# Patient Record
Sex: Female | Born: 1935 | Race: White | Hispanic: No | Smoking: Former smoker
Health system: Southern US, Community
[De-identification: ages and names within clinical notes are randomized; demographics above are authoritative.]

## PROBLEM LIST (undated history)

## (undated) DIAGNOSIS — E785 Hyperlipidemia, unspecified: Secondary | ICD-10-CM

## (undated) DIAGNOSIS — F419 Anxiety disorder, unspecified: Secondary | ICD-10-CM

## (undated) DIAGNOSIS — F32A Depression, unspecified: Secondary | ICD-10-CM

## (undated) DIAGNOSIS — I10 Essential (primary) hypertension: Secondary | ICD-10-CM

## (undated) DIAGNOSIS — R413 Other amnesia: Secondary | ICD-10-CM

---

## 2001-05-11 ENCOUNTER — Encounter: Payer: Self-pay | Admitting: Family Medicine

## 2001-05-11 ENCOUNTER — Ambulatory Visit (HOSPITAL_COMMUNITY): Admission: RE | Admit: 2001-05-11 | Discharge: 2001-05-11 | Payer: Self-pay | Admitting: Family Medicine

## 2001-05-14 ENCOUNTER — Encounter: Payer: Self-pay | Admitting: Family Medicine

## 2001-05-14 ENCOUNTER — Ambulatory Visit (HOSPITAL_COMMUNITY): Admission: RE | Admit: 2001-05-14 | Discharge: 2001-05-14 | Payer: Self-pay | Admitting: Family Medicine

## 2015-02-28 ENCOUNTER — Ambulatory Visit: Payer: Self-pay | Admitting: Nurse Practitioner

## 2015-02-28 ENCOUNTER — Telehealth: Payer: Self-pay | Admitting: Nurse Practitioner

## 2015-02-28 NOTE — Telephone Encounter (Signed)
Noted  

## 2015-02-28 NOTE — Telephone Encounter (Signed)
Pt was a no show

## 2015-11-07 DIAGNOSIS — I10 Essential (primary) hypertension: Secondary | ICD-10-CM | POA: Diagnosis not present

## 2015-11-07 DIAGNOSIS — Z6824 Body mass index (BMI) 24.0-24.9, adult: Secondary | ICD-10-CM | POA: Diagnosis not present

## 2015-11-08 DIAGNOSIS — F419 Anxiety disorder, unspecified: Secondary | ICD-10-CM | POA: Diagnosis not present

## 2015-11-08 DIAGNOSIS — M810 Age-related osteoporosis without current pathological fracture: Secondary | ICD-10-CM | POA: Diagnosis not present

## 2015-11-08 DIAGNOSIS — I10 Essential (primary) hypertension: Secondary | ICD-10-CM | POA: Diagnosis not present

## 2015-11-08 DIAGNOSIS — R412 Retrograde amnesia: Secondary | ICD-10-CM | POA: Diagnosis not present

## 2015-11-09 DIAGNOSIS — R412 Retrograde amnesia: Secondary | ICD-10-CM | POA: Diagnosis not present

## 2015-11-09 DIAGNOSIS — I10 Essential (primary) hypertension: Secondary | ICD-10-CM | POA: Diagnosis not present

## 2015-11-09 DIAGNOSIS — F419 Anxiety disorder, unspecified: Secondary | ICD-10-CM | POA: Diagnosis not present

## 2015-11-09 DIAGNOSIS — M810 Age-related osteoporosis without current pathological fracture: Secondary | ICD-10-CM | POA: Diagnosis not present

## 2015-11-10 DIAGNOSIS — I10 Essential (primary) hypertension: Secondary | ICD-10-CM | POA: Diagnosis not present

## 2015-11-10 DIAGNOSIS — M810 Age-related osteoporosis without current pathological fracture: Secondary | ICD-10-CM | POA: Diagnosis not present

## 2015-11-10 DIAGNOSIS — F419 Anxiety disorder, unspecified: Secondary | ICD-10-CM | POA: Diagnosis not present

## 2015-11-10 DIAGNOSIS — R412 Retrograde amnesia: Secondary | ICD-10-CM | POA: Diagnosis not present

## 2015-11-11 DIAGNOSIS — M810 Age-related osteoporosis without current pathological fracture: Secondary | ICD-10-CM | POA: Diagnosis not present

## 2015-11-11 DIAGNOSIS — I10 Essential (primary) hypertension: Secondary | ICD-10-CM | POA: Diagnosis not present

## 2015-11-11 DIAGNOSIS — R412 Retrograde amnesia: Secondary | ICD-10-CM | POA: Diagnosis not present

## 2015-11-11 DIAGNOSIS — F419 Anxiety disorder, unspecified: Secondary | ICD-10-CM | POA: Diagnosis not present

## 2015-11-12 DIAGNOSIS — M810 Age-related osteoporosis without current pathological fracture: Secondary | ICD-10-CM | POA: Diagnosis not present

## 2015-11-12 DIAGNOSIS — R412 Retrograde amnesia: Secondary | ICD-10-CM | POA: Diagnosis not present

## 2015-11-12 DIAGNOSIS — F419 Anxiety disorder, unspecified: Secondary | ICD-10-CM | POA: Diagnosis not present

## 2015-11-12 DIAGNOSIS — I10 Essential (primary) hypertension: Secondary | ICD-10-CM | POA: Diagnosis not present

## 2015-11-13 DIAGNOSIS — R412 Retrograde amnesia: Secondary | ICD-10-CM | POA: Diagnosis not present

## 2015-11-13 DIAGNOSIS — M810 Age-related osteoporosis without current pathological fracture: Secondary | ICD-10-CM | POA: Diagnosis not present

## 2015-11-13 DIAGNOSIS — I10 Essential (primary) hypertension: Secondary | ICD-10-CM | POA: Diagnosis not present

## 2015-11-13 DIAGNOSIS — F419 Anxiety disorder, unspecified: Secondary | ICD-10-CM | POA: Diagnosis not present

## 2015-11-14 DIAGNOSIS — F419 Anxiety disorder, unspecified: Secondary | ICD-10-CM | POA: Diagnosis not present

## 2015-11-14 DIAGNOSIS — R412 Retrograde amnesia: Secondary | ICD-10-CM | POA: Diagnosis not present

## 2015-11-14 DIAGNOSIS — M810 Age-related osteoporosis without current pathological fracture: Secondary | ICD-10-CM | POA: Diagnosis not present

## 2015-11-14 DIAGNOSIS — I10 Essential (primary) hypertension: Secondary | ICD-10-CM | POA: Diagnosis not present

## 2015-11-15 DIAGNOSIS — R412 Retrograde amnesia: Secondary | ICD-10-CM | POA: Diagnosis not present

## 2015-11-15 DIAGNOSIS — M810 Age-related osteoporosis without current pathological fracture: Secondary | ICD-10-CM | POA: Diagnosis not present

## 2015-11-15 DIAGNOSIS — I10 Essential (primary) hypertension: Secondary | ICD-10-CM | POA: Diagnosis not present

## 2015-11-15 DIAGNOSIS — F419 Anxiety disorder, unspecified: Secondary | ICD-10-CM | POA: Diagnosis not present

## 2015-11-16 DIAGNOSIS — M810 Age-related osteoporosis without current pathological fracture: Secondary | ICD-10-CM | POA: Diagnosis not present

## 2015-11-16 DIAGNOSIS — R412 Retrograde amnesia: Secondary | ICD-10-CM | POA: Diagnosis not present

## 2015-11-16 DIAGNOSIS — F419 Anxiety disorder, unspecified: Secondary | ICD-10-CM | POA: Diagnosis not present

## 2015-11-16 DIAGNOSIS — I10 Essential (primary) hypertension: Secondary | ICD-10-CM | POA: Diagnosis not present

## 2015-11-17 DIAGNOSIS — M810 Age-related osteoporosis without current pathological fracture: Secondary | ICD-10-CM | POA: Diagnosis not present

## 2015-11-17 DIAGNOSIS — I10 Essential (primary) hypertension: Secondary | ICD-10-CM | POA: Diagnosis not present

## 2015-11-17 DIAGNOSIS — R412 Retrograde amnesia: Secondary | ICD-10-CM | POA: Diagnosis not present

## 2015-11-17 DIAGNOSIS — F419 Anxiety disorder, unspecified: Secondary | ICD-10-CM | POA: Diagnosis not present

## 2015-11-18 DIAGNOSIS — R412 Retrograde amnesia: Secondary | ICD-10-CM | POA: Diagnosis not present

## 2015-11-18 DIAGNOSIS — I10 Essential (primary) hypertension: Secondary | ICD-10-CM | POA: Diagnosis not present

## 2015-11-18 DIAGNOSIS — F419 Anxiety disorder, unspecified: Secondary | ICD-10-CM | POA: Diagnosis not present

## 2015-11-18 DIAGNOSIS — M810 Age-related osteoporosis without current pathological fracture: Secondary | ICD-10-CM | POA: Diagnosis not present

## 2015-11-19 DIAGNOSIS — M810 Age-related osteoporosis without current pathological fracture: Secondary | ICD-10-CM | POA: Diagnosis not present

## 2015-11-19 DIAGNOSIS — F419 Anxiety disorder, unspecified: Secondary | ICD-10-CM | POA: Diagnosis not present

## 2015-11-19 DIAGNOSIS — I10 Essential (primary) hypertension: Secondary | ICD-10-CM | POA: Diagnosis not present

## 2015-11-19 DIAGNOSIS — R412 Retrograde amnesia: Secondary | ICD-10-CM | POA: Diagnosis not present

## 2015-11-20 DIAGNOSIS — R412 Retrograde amnesia: Secondary | ICD-10-CM | POA: Diagnosis not present

## 2015-11-20 DIAGNOSIS — I10 Essential (primary) hypertension: Secondary | ICD-10-CM | POA: Diagnosis not present

## 2015-11-20 DIAGNOSIS — M810 Age-related osteoporosis without current pathological fracture: Secondary | ICD-10-CM | POA: Diagnosis not present

## 2015-11-20 DIAGNOSIS — F419 Anxiety disorder, unspecified: Secondary | ICD-10-CM | POA: Diagnosis not present

## 2015-11-21 DIAGNOSIS — I10 Essential (primary) hypertension: Secondary | ICD-10-CM | POA: Diagnosis not present

## 2015-11-21 DIAGNOSIS — M810 Age-related osteoporosis without current pathological fracture: Secondary | ICD-10-CM | POA: Diagnosis not present

## 2015-11-21 DIAGNOSIS — R412 Retrograde amnesia: Secondary | ICD-10-CM | POA: Diagnosis not present

## 2015-11-21 DIAGNOSIS — F419 Anxiety disorder, unspecified: Secondary | ICD-10-CM | POA: Diagnosis not present

## 2015-11-22 DIAGNOSIS — M810 Age-related osteoporosis without current pathological fracture: Secondary | ICD-10-CM | POA: Diagnosis not present

## 2015-11-22 DIAGNOSIS — F419 Anxiety disorder, unspecified: Secondary | ICD-10-CM | POA: Diagnosis not present

## 2015-11-22 DIAGNOSIS — R412 Retrograde amnesia: Secondary | ICD-10-CM | POA: Diagnosis not present

## 2015-11-22 DIAGNOSIS — I10 Essential (primary) hypertension: Secondary | ICD-10-CM | POA: Diagnosis not present

## 2015-11-23 DIAGNOSIS — I10 Essential (primary) hypertension: Secondary | ICD-10-CM | POA: Diagnosis not present

## 2015-11-23 DIAGNOSIS — M810 Age-related osteoporosis without current pathological fracture: Secondary | ICD-10-CM | POA: Diagnosis not present

## 2015-11-23 DIAGNOSIS — R412 Retrograde amnesia: Secondary | ICD-10-CM | POA: Diagnosis not present

## 2015-11-23 DIAGNOSIS — F419 Anxiety disorder, unspecified: Secondary | ICD-10-CM | POA: Diagnosis not present

## 2015-11-24 DIAGNOSIS — M810 Age-related osteoporosis without current pathological fracture: Secondary | ICD-10-CM | POA: Diagnosis not present

## 2015-11-24 DIAGNOSIS — F419 Anxiety disorder, unspecified: Secondary | ICD-10-CM | POA: Diagnosis not present

## 2015-11-24 DIAGNOSIS — I10 Essential (primary) hypertension: Secondary | ICD-10-CM | POA: Diagnosis not present

## 2015-11-24 DIAGNOSIS — R412 Retrograde amnesia: Secondary | ICD-10-CM | POA: Diagnosis not present

## 2015-11-25 DIAGNOSIS — R412 Retrograde amnesia: Secondary | ICD-10-CM | POA: Diagnosis not present

## 2015-11-25 DIAGNOSIS — F419 Anxiety disorder, unspecified: Secondary | ICD-10-CM | POA: Diagnosis not present

## 2015-11-25 DIAGNOSIS — M810 Age-related osteoporosis without current pathological fracture: Secondary | ICD-10-CM | POA: Diagnosis not present

## 2015-11-25 DIAGNOSIS — I10 Essential (primary) hypertension: Secondary | ICD-10-CM | POA: Diagnosis not present

## 2015-11-26 DIAGNOSIS — R412 Retrograde amnesia: Secondary | ICD-10-CM | POA: Diagnosis not present

## 2015-11-26 DIAGNOSIS — F419 Anxiety disorder, unspecified: Secondary | ICD-10-CM | POA: Diagnosis not present

## 2015-11-26 DIAGNOSIS — M810 Age-related osteoporosis without current pathological fracture: Secondary | ICD-10-CM | POA: Diagnosis not present

## 2015-11-26 DIAGNOSIS — I10 Essential (primary) hypertension: Secondary | ICD-10-CM | POA: Diagnosis not present

## 2015-11-27 DIAGNOSIS — M810 Age-related osteoporosis without current pathological fracture: Secondary | ICD-10-CM | POA: Diagnosis not present

## 2015-11-27 DIAGNOSIS — R412 Retrograde amnesia: Secondary | ICD-10-CM | POA: Diagnosis not present

## 2015-11-27 DIAGNOSIS — F419 Anxiety disorder, unspecified: Secondary | ICD-10-CM | POA: Diagnosis not present

## 2015-11-27 DIAGNOSIS — I10 Essential (primary) hypertension: Secondary | ICD-10-CM | POA: Diagnosis not present

## 2015-11-28 DIAGNOSIS — M810 Age-related osteoporosis without current pathological fracture: Secondary | ICD-10-CM | POA: Diagnosis not present

## 2015-11-28 DIAGNOSIS — I10 Essential (primary) hypertension: Secondary | ICD-10-CM | POA: Diagnosis not present

## 2015-11-28 DIAGNOSIS — F419 Anxiety disorder, unspecified: Secondary | ICD-10-CM | POA: Diagnosis not present

## 2015-11-28 DIAGNOSIS — R412 Retrograde amnesia: Secondary | ICD-10-CM | POA: Diagnosis not present

## 2015-11-29 DIAGNOSIS — R412 Retrograde amnesia: Secondary | ICD-10-CM | POA: Diagnosis not present

## 2015-11-29 DIAGNOSIS — M810 Age-related osteoporosis without current pathological fracture: Secondary | ICD-10-CM | POA: Diagnosis not present

## 2015-11-29 DIAGNOSIS — F419 Anxiety disorder, unspecified: Secondary | ICD-10-CM | POA: Diagnosis not present

## 2015-11-29 DIAGNOSIS — I10 Essential (primary) hypertension: Secondary | ICD-10-CM | POA: Diagnosis not present

## 2015-11-30 DIAGNOSIS — F419 Anxiety disorder, unspecified: Secondary | ICD-10-CM | POA: Diagnosis not present

## 2015-11-30 DIAGNOSIS — I10 Essential (primary) hypertension: Secondary | ICD-10-CM | POA: Diagnosis not present

## 2015-11-30 DIAGNOSIS — R412 Retrograde amnesia: Secondary | ICD-10-CM | POA: Diagnosis not present

## 2015-11-30 DIAGNOSIS — M810 Age-related osteoporosis without current pathological fracture: Secondary | ICD-10-CM | POA: Diagnosis not present

## 2015-12-01 DIAGNOSIS — M810 Age-related osteoporosis without current pathological fracture: Secondary | ICD-10-CM | POA: Diagnosis not present

## 2015-12-01 DIAGNOSIS — I10 Essential (primary) hypertension: Secondary | ICD-10-CM | POA: Diagnosis not present

## 2015-12-01 DIAGNOSIS — F419 Anxiety disorder, unspecified: Secondary | ICD-10-CM | POA: Diagnosis not present

## 2015-12-01 DIAGNOSIS — R412 Retrograde amnesia: Secondary | ICD-10-CM | POA: Diagnosis not present

## 2015-12-02 DIAGNOSIS — M810 Age-related osteoporosis without current pathological fracture: Secondary | ICD-10-CM | POA: Diagnosis not present

## 2015-12-02 DIAGNOSIS — I10 Essential (primary) hypertension: Secondary | ICD-10-CM | POA: Diagnosis not present

## 2015-12-02 DIAGNOSIS — F419 Anxiety disorder, unspecified: Secondary | ICD-10-CM | POA: Diagnosis not present

## 2015-12-02 DIAGNOSIS — R412 Retrograde amnesia: Secondary | ICD-10-CM | POA: Diagnosis not present

## 2015-12-03 DIAGNOSIS — M810 Age-related osteoporosis without current pathological fracture: Secondary | ICD-10-CM | POA: Diagnosis not present

## 2015-12-03 DIAGNOSIS — F419 Anxiety disorder, unspecified: Secondary | ICD-10-CM | POA: Diagnosis not present

## 2015-12-03 DIAGNOSIS — I10 Essential (primary) hypertension: Secondary | ICD-10-CM | POA: Diagnosis not present

## 2015-12-03 DIAGNOSIS — R412 Retrograde amnesia: Secondary | ICD-10-CM | POA: Diagnosis not present

## 2015-12-04 DIAGNOSIS — R412 Retrograde amnesia: Secondary | ICD-10-CM | POA: Diagnosis not present

## 2015-12-04 DIAGNOSIS — I10 Essential (primary) hypertension: Secondary | ICD-10-CM | POA: Diagnosis not present

## 2015-12-04 DIAGNOSIS — F419 Anxiety disorder, unspecified: Secondary | ICD-10-CM | POA: Diagnosis not present

## 2015-12-04 DIAGNOSIS — M810 Age-related osteoporosis without current pathological fracture: Secondary | ICD-10-CM | POA: Diagnosis not present

## 2015-12-05 DIAGNOSIS — F419 Anxiety disorder, unspecified: Secondary | ICD-10-CM | POA: Diagnosis not present

## 2015-12-05 DIAGNOSIS — I10 Essential (primary) hypertension: Secondary | ICD-10-CM | POA: Diagnosis not present

## 2015-12-05 DIAGNOSIS — M810 Age-related osteoporosis without current pathological fracture: Secondary | ICD-10-CM | POA: Diagnosis not present

## 2015-12-05 DIAGNOSIS — R412 Retrograde amnesia: Secondary | ICD-10-CM | POA: Diagnosis not present

## 2015-12-06 DIAGNOSIS — F419 Anxiety disorder, unspecified: Secondary | ICD-10-CM | POA: Diagnosis not present

## 2015-12-06 DIAGNOSIS — I10 Essential (primary) hypertension: Secondary | ICD-10-CM | POA: Diagnosis not present

## 2015-12-06 DIAGNOSIS — R412 Retrograde amnesia: Secondary | ICD-10-CM | POA: Diagnosis not present

## 2015-12-06 DIAGNOSIS — M810 Age-related osteoporosis without current pathological fracture: Secondary | ICD-10-CM | POA: Diagnosis not present

## 2015-12-07 DIAGNOSIS — I10 Essential (primary) hypertension: Secondary | ICD-10-CM | POA: Diagnosis not present

## 2015-12-07 DIAGNOSIS — F419 Anxiety disorder, unspecified: Secondary | ICD-10-CM | POA: Diagnosis not present

## 2015-12-07 DIAGNOSIS — R412 Retrograde amnesia: Secondary | ICD-10-CM | POA: Diagnosis not present

## 2015-12-07 DIAGNOSIS — M810 Age-related osteoporosis without current pathological fracture: Secondary | ICD-10-CM | POA: Diagnosis not present

## 2015-12-08 DIAGNOSIS — R412 Retrograde amnesia: Secondary | ICD-10-CM | POA: Diagnosis not present

## 2015-12-08 DIAGNOSIS — I10 Essential (primary) hypertension: Secondary | ICD-10-CM | POA: Diagnosis not present

## 2015-12-08 DIAGNOSIS — M810 Age-related osteoporosis without current pathological fracture: Secondary | ICD-10-CM | POA: Diagnosis not present

## 2015-12-08 DIAGNOSIS — F419 Anxiety disorder, unspecified: Secondary | ICD-10-CM | POA: Diagnosis not present

## 2015-12-09 DIAGNOSIS — F419 Anxiety disorder, unspecified: Secondary | ICD-10-CM | POA: Diagnosis not present

## 2015-12-09 DIAGNOSIS — I10 Essential (primary) hypertension: Secondary | ICD-10-CM | POA: Diagnosis not present

## 2015-12-09 DIAGNOSIS — R412 Retrograde amnesia: Secondary | ICD-10-CM | POA: Diagnosis not present

## 2015-12-09 DIAGNOSIS — M810 Age-related osteoporosis without current pathological fracture: Secondary | ICD-10-CM | POA: Diagnosis not present

## 2015-12-10 DIAGNOSIS — F419 Anxiety disorder, unspecified: Secondary | ICD-10-CM | POA: Diagnosis not present

## 2015-12-10 DIAGNOSIS — R412 Retrograde amnesia: Secondary | ICD-10-CM | POA: Diagnosis not present

## 2015-12-10 DIAGNOSIS — M810 Age-related osteoporosis without current pathological fracture: Secondary | ICD-10-CM | POA: Diagnosis not present

## 2015-12-10 DIAGNOSIS — I10 Essential (primary) hypertension: Secondary | ICD-10-CM | POA: Diagnosis not present

## 2015-12-11 DIAGNOSIS — F419 Anxiety disorder, unspecified: Secondary | ICD-10-CM | POA: Diagnosis not present

## 2015-12-11 DIAGNOSIS — M810 Age-related osteoporosis without current pathological fracture: Secondary | ICD-10-CM | POA: Diagnosis not present

## 2015-12-11 DIAGNOSIS — R412 Retrograde amnesia: Secondary | ICD-10-CM | POA: Diagnosis not present

## 2015-12-11 DIAGNOSIS — I10 Essential (primary) hypertension: Secondary | ICD-10-CM | POA: Diagnosis not present

## 2015-12-12 DIAGNOSIS — I10 Essential (primary) hypertension: Secondary | ICD-10-CM | POA: Diagnosis not present

## 2015-12-12 DIAGNOSIS — R412 Retrograde amnesia: Secondary | ICD-10-CM | POA: Diagnosis not present

## 2015-12-12 DIAGNOSIS — M810 Age-related osteoporosis without current pathological fracture: Secondary | ICD-10-CM | POA: Diagnosis not present

## 2015-12-12 DIAGNOSIS — F419 Anxiety disorder, unspecified: Secondary | ICD-10-CM | POA: Diagnosis not present

## 2015-12-13 DIAGNOSIS — F419 Anxiety disorder, unspecified: Secondary | ICD-10-CM | POA: Diagnosis not present

## 2015-12-13 DIAGNOSIS — I10 Essential (primary) hypertension: Secondary | ICD-10-CM | POA: Diagnosis not present

## 2015-12-13 DIAGNOSIS — R412 Retrograde amnesia: Secondary | ICD-10-CM | POA: Diagnosis not present

## 2015-12-13 DIAGNOSIS — M810 Age-related osteoporosis without current pathological fracture: Secondary | ICD-10-CM | POA: Diagnosis not present

## 2015-12-14 DIAGNOSIS — F419 Anxiety disorder, unspecified: Secondary | ICD-10-CM | POA: Diagnosis not present

## 2015-12-14 DIAGNOSIS — R412 Retrograde amnesia: Secondary | ICD-10-CM | POA: Diagnosis not present

## 2015-12-14 DIAGNOSIS — M810 Age-related osteoporosis without current pathological fracture: Secondary | ICD-10-CM | POA: Diagnosis not present

## 2015-12-14 DIAGNOSIS — I10 Essential (primary) hypertension: Secondary | ICD-10-CM | POA: Diagnosis not present

## 2015-12-15 DIAGNOSIS — R412 Retrograde amnesia: Secondary | ICD-10-CM | POA: Diagnosis not present

## 2015-12-15 DIAGNOSIS — F419 Anxiety disorder, unspecified: Secondary | ICD-10-CM | POA: Diagnosis not present

## 2015-12-15 DIAGNOSIS — M810 Age-related osteoporosis without current pathological fracture: Secondary | ICD-10-CM | POA: Diagnosis not present

## 2015-12-15 DIAGNOSIS — I10 Essential (primary) hypertension: Secondary | ICD-10-CM | POA: Diagnosis not present

## 2015-12-16 DIAGNOSIS — F419 Anxiety disorder, unspecified: Secondary | ICD-10-CM | POA: Diagnosis not present

## 2015-12-16 DIAGNOSIS — M810 Age-related osteoporosis without current pathological fracture: Secondary | ICD-10-CM | POA: Diagnosis not present

## 2015-12-16 DIAGNOSIS — R412 Retrograde amnesia: Secondary | ICD-10-CM | POA: Diagnosis not present

## 2015-12-16 DIAGNOSIS — I10 Essential (primary) hypertension: Secondary | ICD-10-CM | POA: Diagnosis not present

## 2015-12-17 DIAGNOSIS — R412 Retrograde amnesia: Secondary | ICD-10-CM | POA: Diagnosis not present

## 2015-12-17 DIAGNOSIS — F419 Anxiety disorder, unspecified: Secondary | ICD-10-CM | POA: Diagnosis not present

## 2015-12-17 DIAGNOSIS — M810 Age-related osteoporosis without current pathological fracture: Secondary | ICD-10-CM | POA: Diagnosis not present

## 2015-12-17 DIAGNOSIS — I10 Essential (primary) hypertension: Secondary | ICD-10-CM | POA: Diagnosis not present

## 2015-12-18 DIAGNOSIS — R412 Retrograde amnesia: Secondary | ICD-10-CM | POA: Diagnosis not present

## 2015-12-18 DIAGNOSIS — I10 Essential (primary) hypertension: Secondary | ICD-10-CM | POA: Diagnosis not present

## 2015-12-18 DIAGNOSIS — F419 Anxiety disorder, unspecified: Secondary | ICD-10-CM | POA: Diagnosis not present

## 2015-12-18 DIAGNOSIS — M810 Age-related osteoporosis without current pathological fracture: Secondary | ICD-10-CM | POA: Diagnosis not present

## 2015-12-19 DIAGNOSIS — I10 Essential (primary) hypertension: Secondary | ICD-10-CM | POA: Diagnosis not present

## 2015-12-19 DIAGNOSIS — R412 Retrograde amnesia: Secondary | ICD-10-CM | POA: Diagnosis not present

## 2015-12-19 DIAGNOSIS — M810 Age-related osteoporosis without current pathological fracture: Secondary | ICD-10-CM | POA: Diagnosis not present

## 2015-12-19 DIAGNOSIS — F419 Anxiety disorder, unspecified: Secondary | ICD-10-CM | POA: Diagnosis not present

## 2015-12-20 DIAGNOSIS — M810 Age-related osteoporosis without current pathological fracture: Secondary | ICD-10-CM | POA: Diagnosis not present

## 2015-12-20 DIAGNOSIS — R412 Retrograde amnesia: Secondary | ICD-10-CM | POA: Diagnosis not present

## 2015-12-20 DIAGNOSIS — F419 Anxiety disorder, unspecified: Secondary | ICD-10-CM | POA: Diagnosis not present

## 2015-12-20 DIAGNOSIS — I10 Essential (primary) hypertension: Secondary | ICD-10-CM | POA: Diagnosis not present

## 2015-12-21 DIAGNOSIS — R412 Retrograde amnesia: Secondary | ICD-10-CM | POA: Diagnosis not present

## 2015-12-21 DIAGNOSIS — I10 Essential (primary) hypertension: Secondary | ICD-10-CM | POA: Diagnosis not present

## 2015-12-21 DIAGNOSIS — M810 Age-related osteoporosis without current pathological fracture: Secondary | ICD-10-CM | POA: Diagnosis not present

## 2015-12-21 DIAGNOSIS — F419 Anxiety disorder, unspecified: Secondary | ICD-10-CM | POA: Diagnosis not present

## 2015-12-22 DIAGNOSIS — M810 Age-related osteoporosis without current pathological fracture: Secondary | ICD-10-CM | POA: Diagnosis not present

## 2015-12-22 DIAGNOSIS — R412 Retrograde amnesia: Secondary | ICD-10-CM | POA: Diagnosis not present

## 2015-12-22 DIAGNOSIS — I10 Essential (primary) hypertension: Secondary | ICD-10-CM | POA: Diagnosis not present

## 2015-12-22 DIAGNOSIS — F419 Anxiety disorder, unspecified: Secondary | ICD-10-CM | POA: Diagnosis not present

## 2015-12-23 DIAGNOSIS — F419 Anxiety disorder, unspecified: Secondary | ICD-10-CM | POA: Diagnosis not present

## 2015-12-23 DIAGNOSIS — I10 Essential (primary) hypertension: Secondary | ICD-10-CM | POA: Diagnosis not present

## 2015-12-23 DIAGNOSIS — R412 Retrograde amnesia: Secondary | ICD-10-CM | POA: Diagnosis not present

## 2015-12-23 DIAGNOSIS — M810 Age-related osteoporosis without current pathological fracture: Secondary | ICD-10-CM | POA: Diagnosis not present

## 2015-12-24 DIAGNOSIS — M810 Age-related osteoporosis without current pathological fracture: Secondary | ICD-10-CM | POA: Diagnosis not present

## 2015-12-24 DIAGNOSIS — F419 Anxiety disorder, unspecified: Secondary | ICD-10-CM | POA: Diagnosis not present

## 2015-12-24 DIAGNOSIS — R412 Retrograde amnesia: Secondary | ICD-10-CM | POA: Diagnosis not present

## 2015-12-24 DIAGNOSIS — I10 Essential (primary) hypertension: Secondary | ICD-10-CM | POA: Diagnosis not present

## 2015-12-25 DIAGNOSIS — I10 Essential (primary) hypertension: Secondary | ICD-10-CM | POA: Diagnosis not present

## 2015-12-25 DIAGNOSIS — M810 Age-related osteoporosis without current pathological fracture: Secondary | ICD-10-CM | POA: Diagnosis not present

## 2015-12-25 DIAGNOSIS — R412 Retrograde amnesia: Secondary | ICD-10-CM | POA: Diagnosis not present

## 2015-12-25 DIAGNOSIS — F419 Anxiety disorder, unspecified: Secondary | ICD-10-CM | POA: Diagnosis not present

## 2015-12-26 DIAGNOSIS — F419 Anxiety disorder, unspecified: Secondary | ICD-10-CM | POA: Diagnosis not present

## 2015-12-26 DIAGNOSIS — M810 Age-related osteoporosis without current pathological fracture: Secondary | ICD-10-CM | POA: Diagnosis not present

## 2015-12-26 DIAGNOSIS — I10 Essential (primary) hypertension: Secondary | ICD-10-CM | POA: Diagnosis not present

## 2015-12-26 DIAGNOSIS — R412 Retrograde amnesia: Secondary | ICD-10-CM | POA: Diagnosis not present

## 2015-12-27 DIAGNOSIS — R412 Retrograde amnesia: Secondary | ICD-10-CM | POA: Diagnosis not present

## 2015-12-27 DIAGNOSIS — F419 Anxiety disorder, unspecified: Secondary | ICD-10-CM | POA: Diagnosis not present

## 2015-12-27 DIAGNOSIS — I10 Essential (primary) hypertension: Secondary | ICD-10-CM | POA: Diagnosis not present

## 2015-12-27 DIAGNOSIS — M810 Age-related osteoporosis without current pathological fracture: Secondary | ICD-10-CM | POA: Diagnosis not present

## 2015-12-28 DIAGNOSIS — R412 Retrograde amnesia: Secondary | ICD-10-CM | POA: Diagnosis not present

## 2015-12-28 DIAGNOSIS — I10 Essential (primary) hypertension: Secondary | ICD-10-CM | POA: Diagnosis not present

## 2015-12-28 DIAGNOSIS — F419 Anxiety disorder, unspecified: Secondary | ICD-10-CM | POA: Diagnosis not present

## 2015-12-28 DIAGNOSIS — M810 Age-related osteoporosis without current pathological fracture: Secondary | ICD-10-CM | POA: Diagnosis not present

## 2015-12-29 DIAGNOSIS — M810 Age-related osteoporosis without current pathological fracture: Secondary | ICD-10-CM | POA: Diagnosis not present

## 2015-12-29 DIAGNOSIS — R412 Retrograde amnesia: Secondary | ICD-10-CM | POA: Diagnosis not present

## 2015-12-29 DIAGNOSIS — I10 Essential (primary) hypertension: Secondary | ICD-10-CM | POA: Diagnosis not present

## 2015-12-29 DIAGNOSIS — F419 Anxiety disorder, unspecified: Secondary | ICD-10-CM | POA: Diagnosis not present

## 2015-12-30 DIAGNOSIS — F419 Anxiety disorder, unspecified: Secondary | ICD-10-CM | POA: Diagnosis not present

## 2015-12-30 DIAGNOSIS — R412 Retrograde amnesia: Secondary | ICD-10-CM | POA: Diagnosis not present

## 2015-12-30 DIAGNOSIS — I10 Essential (primary) hypertension: Secondary | ICD-10-CM | POA: Diagnosis not present

## 2015-12-30 DIAGNOSIS — M810 Age-related osteoporosis without current pathological fracture: Secondary | ICD-10-CM | POA: Diagnosis not present

## 2015-12-31 DIAGNOSIS — R412 Retrograde amnesia: Secondary | ICD-10-CM | POA: Diagnosis not present

## 2015-12-31 DIAGNOSIS — M810 Age-related osteoporosis without current pathological fracture: Secondary | ICD-10-CM | POA: Diagnosis not present

## 2015-12-31 DIAGNOSIS — I10 Essential (primary) hypertension: Secondary | ICD-10-CM | POA: Diagnosis not present

## 2015-12-31 DIAGNOSIS — F419 Anxiety disorder, unspecified: Secondary | ICD-10-CM | POA: Diagnosis not present

## 2016-01-01 DIAGNOSIS — I10 Essential (primary) hypertension: Secondary | ICD-10-CM | POA: Diagnosis not present

## 2016-01-01 DIAGNOSIS — F419 Anxiety disorder, unspecified: Secondary | ICD-10-CM | POA: Diagnosis not present

## 2016-01-01 DIAGNOSIS — R412 Retrograde amnesia: Secondary | ICD-10-CM | POA: Diagnosis not present

## 2016-01-01 DIAGNOSIS — M810 Age-related osteoporosis without current pathological fracture: Secondary | ICD-10-CM | POA: Diagnosis not present

## 2016-01-02 DIAGNOSIS — R412 Retrograde amnesia: Secondary | ICD-10-CM | POA: Diagnosis not present

## 2016-01-02 DIAGNOSIS — I10 Essential (primary) hypertension: Secondary | ICD-10-CM | POA: Diagnosis not present

## 2016-01-02 DIAGNOSIS — M810 Age-related osteoporosis without current pathological fracture: Secondary | ICD-10-CM | POA: Diagnosis not present

## 2016-01-02 DIAGNOSIS — F419 Anxiety disorder, unspecified: Secondary | ICD-10-CM | POA: Diagnosis not present

## 2016-01-03 DIAGNOSIS — I10 Essential (primary) hypertension: Secondary | ICD-10-CM | POA: Diagnosis not present

## 2016-01-03 DIAGNOSIS — R412 Retrograde amnesia: Secondary | ICD-10-CM | POA: Diagnosis not present

## 2016-01-03 DIAGNOSIS — F419 Anxiety disorder, unspecified: Secondary | ICD-10-CM | POA: Diagnosis not present

## 2016-01-03 DIAGNOSIS — M810 Age-related osteoporosis without current pathological fracture: Secondary | ICD-10-CM | POA: Diagnosis not present

## 2016-01-04 DIAGNOSIS — I10 Essential (primary) hypertension: Secondary | ICD-10-CM | POA: Diagnosis not present

## 2016-01-04 DIAGNOSIS — M810 Age-related osteoporosis without current pathological fracture: Secondary | ICD-10-CM | POA: Diagnosis not present

## 2016-01-04 DIAGNOSIS — F419 Anxiety disorder, unspecified: Secondary | ICD-10-CM | POA: Diagnosis not present

## 2016-01-04 DIAGNOSIS — R412 Retrograde amnesia: Secondary | ICD-10-CM | POA: Diagnosis not present

## 2016-01-05 DIAGNOSIS — F419 Anxiety disorder, unspecified: Secondary | ICD-10-CM | POA: Diagnosis not present

## 2016-01-05 DIAGNOSIS — I10 Essential (primary) hypertension: Secondary | ICD-10-CM | POA: Diagnosis not present

## 2016-01-05 DIAGNOSIS — R412 Retrograde amnesia: Secondary | ICD-10-CM | POA: Diagnosis not present

## 2016-01-05 DIAGNOSIS — M810 Age-related osteoporosis without current pathological fracture: Secondary | ICD-10-CM | POA: Diagnosis not present

## 2016-01-06 DIAGNOSIS — M810 Age-related osteoporosis without current pathological fracture: Secondary | ICD-10-CM | POA: Diagnosis not present

## 2016-01-06 DIAGNOSIS — F419 Anxiety disorder, unspecified: Secondary | ICD-10-CM | POA: Diagnosis not present

## 2016-01-06 DIAGNOSIS — R412 Retrograde amnesia: Secondary | ICD-10-CM | POA: Diagnosis not present

## 2016-01-06 DIAGNOSIS — I10 Essential (primary) hypertension: Secondary | ICD-10-CM | POA: Diagnosis not present

## 2016-01-07 DIAGNOSIS — F419 Anxiety disorder, unspecified: Secondary | ICD-10-CM | POA: Diagnosis not present

## 2016-01-07 DIAGNOSIS — I10 Essential (primary) hypertension: Secondary | ICD-10-CM | POA: Diagnosis not present

## 2016-01-07 DIAGNOSIS — R412 Retrograde amnesia: Secondary | ICD-10-CM | POA: Diagnosis not present

## 2016-01-07 DIAGNOSIS — M810 Age-related osteoporosis without current pathological fracture: Secondary | ICD-10-CM | POA: Diagnosis not present

## 2016-01-08 DIAGNOSIS — I10 Essential (primary) hypertension: Secondary | ICD-10-CM | POA: Diagnosis not present

## 2016-01-08 DIAGNOSIS — F419 Anxiety disorder, unspecified: Secondary | ICD-10-CM | POA: Diagnosis not present

## 2016-01-08 DIAGNOSIS — M810 Age-related osteoporosis without current pathological fracture: Secondary | ICD-10-CM | POA: Diagnosis not present

## 2016-01-08 DIAGNOSIS — R412 Retrograde amnesia: Secondary | ICD-10-CM | POA: Diagnosis not present

## 2016-01-09 DIAGNOSIS — I10 Essential (primary) hypertension: Secondary | ICD-10-CM | POA: Diagnosis not present

## 2016-01-09 DIAGNOSIS — F419 Anxiety disorder, unspecified: Secondary | ICD-10-CM | POA: Diagnosis not present

## 2016-01-09 DIAGNOSIS — M810 Age-related osteoporosis without current pathological fracture: Secondary | ICD-10-CM | POA: Diagnosis not present

## 2016-01-09 DIAGNOSIS — R412 Retrograde amnesia: Secondary | ICD-10-CM | POA: Diagnosis not present

## 2016-01-10 DIAGNOSIS — M810 Age-related osteoporosis without current pathological fracture: Secondary | ICD-10-CM | POA: Diagnosis not present

## 2016-01-10 DIAGNOSIS — I10 Essential (primary) hypertension: Secondary | ICD-10-CM | POA: Diagnosis not present

## 2016-01-10 DIAGNOSIS — R412 Retrograde amnesia: Secondary | ICD-10-CM | POA: Diagnosis not present

## 2016-01-10 DIAGNOSIS — F419 Anxiety disorder, unspecified: Secondary | ICD-10-CM | POA: Diagnosis not present

## 2016-01-11 DIAGNOSIS — R412 Retrograde amnesia: Secondary | ICD-10-CM | POA: Diagnosis not present

## 2016-01-11 DIAGNOSIS — M810 Age-related osteoporosis without current pathological fracture: Secondary | ICD-10-CM | POA: Diagnosis not present

## 2016-01-11 DIAGNOSIS — I10 Essential (primary) hypertension: Secondary | ICD-10-CM | POA: Diagnosis not present

## 2016-01-11 DIAGNOSIS — F419 Anxiety disorder, unspecified: Secondary | ICD-10-CM | POA: Diagnosis not present

## 2016-01-12 DIAGNOSIS — F419 Anxiety disorder, unspecified: Secondary | ICD-10-CM | POA: Diagnosis not present

## 2016-01-12 DIAGNOSIS — I10 Essential (primary) hypertension: Secondary | ICD-10-CM | POA: Diagnosis not present

## 2016-01-12 DIAGNOSIS — R412 Retrograde amnesia: Secondary | ICD-10-CM | POA: Diagnosis not present

## 2016-01-12 DIAGNOSIS — M810 Age-related osteoporosis without current pathological fracture: Secondary | ICD-10-CM | POA: Diagnosis not present

## 2016-01-13 DIAGNOSIS — F419 Anxiety disorder, unspecified: Secondary | ICD-10-CM | POA: Diagnosis not present

## 2016-01-13 DIAGNOSIS — R412 Retrograde amnesia: Secondary | ICD-10-CM | POA: Diagnosis not present

## 2016-01-13 DIAGNOSIS — I10 Essential (primary) hypertension: Secondary | ICD-10-CM | POA: Diagnosis not present

## 2016-01-13 DIAGNOSIS — M810 Age-related osteoporosis without current pathological fracture: Secondary | ICD-10-CM | POA: Diagnosis not present

## 2016-01-14 DIAGNOSIS — F419 Anxiety disorder, unspecified: Secondary | ICD-10-CM | POA: Diagnosis not present

## 2016-01-14 DIAGNOSIS — I10 Essential (primary) hypertension: Secondary | ICD-10-CM | POA: Diagnosis not present

## 2016-01-14 DIAGNOSIS — R412 Retrograde amnesia: Secondary | ICD-10-CM | POA: Diagnosis not present

## 2016-01-14 DIAGNOSIS — M810 Age-related osteoporosis without current pathological fracture: Secondary | ICD-10-CM | POA: Diagnosis not present

## 2016-01-15 DIAGNOSIS — M810 Age-related osteoporosis without current pathological fracture: Secondary | ICD-10-CM | POA: Diagnosis not present

## 2016-01-15 DIAGNOSIS — F419 Anxiety disorder, unspecified: Secondary | ICD-10-CM | POA: Diagnosis not present

## 2016-01-15 DIAGNOSIS — R412 Retrograde amnesia: Secondary | ICD-10-CM | POA: Diagnosis not present

## 2016-01-15 DIAGNOSIS — I10 Essential (primary) hypertension: Secondary | ICD-10-CM | POA: Diagnosis not present

## 2016-01-16 DIAGNOSIS — M810 Age-related osteoporosis without current pathological fracture: Secondary | ICD-10-CM | POA: Diagnosis not present

## 2016-01-16 DIAGNOSIS — I10 Essential (primary) hypertension: Secondary | ICD-10-CM | POA: Diagnosis not present

## 2016-01-16 DIAGNOSIS — F419 Anxiety disorder, unspecified: Secondary | ICD-10-CM | POA: Diagnosis not present

## 2016-01-16 DIAGNOSIS — R412 Retrograde amnesia: Secondary | ICD-10-CM | POA: Diagnosis not present

## 2016-01-17 DIAGNOSIS — F419 Anxiety disorder, unspecified: Secondary | ICD-10-CM | POA: Diagnosis not present

## 2016-01-17 DIAGNOSIS — R412 Retrograde amnesia: Secondary | ICD-10-CM | POA: Diagnosis not present

## 2016-01-17 DIAGNOSIS — M810 Age-related osteoporosis without current pathological fracture: Secondary | ICD-10-CM | POA: Diagnosis not present

## 2016-01-17 DIAGNOSIS — I10 Essential (primary) hypertension: Secondary | ICD-10-CM | POA: Diagnosis not present

## 2016-01-18 DIAGNOSIS — I10 Essential (primary) hypertension: Secondary | ICD-10-CM | POA: Diagnosis not present

## 2016-01-18 DIAGNOSIS — M810 Age-related osteoporosis without current pathological fracture: Secondary | ICD-10-CM | POA: Diagnosis not present

## 2016-01-18 DIAGNOSIS — R412 Retrograde amnesia: Secondary | ICD-10-CM | POA: Diagnosis not present

## 2016-01-18 DIAGNOSIS — F419 Anxiety disorder, unspecified: Secondary | ICD-10-CM | POA: Diagnosis not present

## 2016-01-19 DIAGNOSIS — M810 Age-related osteoporosis without current pathological fracture: Secondary | ICD-10-CM | POA: Diagnosis not present

## 2016-01-19 DIAGNOSIS — F419 Anxiety disorder, unspecified: Secondary | ICD-10-CM | POA: Diagnosis not present

## 2016-01-19 DIAGNOSIS — I10 Essential (primary) hypertension: Secondary | ICD-10-CM | POA: Diagnosis not present

## 2016-01-19 DIAGNOSIS — R412 Retrograde amnesia: Secondary | ICD-10-CM | POA: Diagnosis not present

## 2016-01-20 DIAGNOSIS — M810 Age-related osteoporosis without current pathological fracture: Secondary | ICD-10-CM | POA: Diagnosis not present

## 2016-01-20 DIAGNOSIS — R412 Retrograde amnesia: Secondary | ICD-10-CM | POA: Diagnosis not present

## 2016-01-20 DIAGNOSIS — F419 Anxiety disorder, unspecified: Secondary | ICD-10-CM | POA: Diagnosis not present

## 2016-01-20 DIAGNOSIS — I10 Essential (primary) hypertension: Secondary | ICD-10-CM | POA: Diagnosis not present

## 2016-01-21 DIAGNOSIS — F419 Anxiety disorder, unspecified: Secondary | ICD-10-CM | POA: Diagnosis not present

## 2016-01-21 DIAGNOSIS — R412 Retrograde amnesia: Secondary | ICD-10-CM | POA: Diagnosis not present

## 2016-01-21 DIAGNOSIS — I10 Essential (primary) hypertension: Secondary | ICD-10-CM | POA: Diagnosis not present

## 2016-01-21 DIAGNOSIS — M810 Age-related osteoporosis without current pathological fracture: Secondary | ICD-10-CM | POA: Diagnosis not present

## 2016-01-22 DIAGNOSIS — M810 Age-related osteoporosis without current pathological fracture: Secondary | ICD-10-CM | POA: Diagnosis not present

## 2016-01-22 DIAGNOSIS — F419 Anxiety disorder, unspecified: Secondary | ICD-10-CM | POA: Diagnosis not present

## 2016-01-22 DIAGNOSIS — R412 Retrograde amnesia: Secondary | ICD-10-CM | POA: Diagnosis not present

## 2016-01-22 DIAGNOSIS — I10 Essential (primary) hypertension: Secondary | ICD-10-CM | POA: Diagnosis not present

## 2016-01-23 DIAGNOSIS — F419 Anxiety disorder, unspecified: Secondary | ICD-10-CM | POA: Diagnosis not present

## 2016-01-23 DIAGNOSIS — I10 Essential (primary) hypertension: Secondary | ICD-10-CM | POA: Diagnosis not present

## 2016-01-23 DIAGNOSIS — M810 Age-related osteoporosis without current pathological fracture: Secondary | ICD-10-CM | POA: Diagnosis not present

## 2016-01-23 DIAGNOSIS — R412 Retrograde amnesia: Secondary | ICD-10-CM | POA: Diagnosis not present

## 2016-01-24 DIAGNOSIS — F419 Anxiety disorder, unspecified: Secondary | ICD-10-CM | POA: Diagnosis not present

## 2016-01-24 DIAGNOSIS — R412 Retrograde amnesia: Secondary | ICD-10-CM | POA: Diagnosis not present

## 2016-01-24 DIAGNOSIS — I10 Essential (primary) hypertension: Secondary | ICD-10-CM | POA: Diagnosis not present

## 2016-01-24 DIAGNOSIS — M810 Age-related osteoporosis without current pathological fracture: Secondary | ICD-10-CM | POA: Diagnosis not present

## 2016-01-25 DIAGNOSIS — R412 Retrograde amnesia: Secondary | ICD-10-CM | POA: Diagnosis not present

## 2016-01-25 DIAGNOSIS — I10 Essential (primary) hypertension: Secondary | ICD-10-CM | POA: Diagnosis not present

## 2016-01-25 DIAGNOSIS — M810 Age-related osteoporosis without current pathological fracture: Secondary | ICD-10-CM | POA: Diagnosis not present

## 2016-01-25 DIAGNOSIS — F419 Anxiety disorder, unspecified: Secondary | ICD-10-CM | POA: Diagnosis not present

## 2016-01-26 DIAGNOSIS — I10 Essential (primary) hypertension: Secondary | ICD-10-CM | POA: Diagnosis not present

## 2016-01-26 DIAGNOSIS — R412 Retrograde amnesia: Secondary | ICD-10-CM | POA: Diagnosis not present

## 2016-01-26 DIAGNOSIS — F419 Anxiety disorder, unspecified: Secondary | ICD-10-CM | POA: Diagnosis not present

## 2016-01-26 DIAGNOSIS — M810 Age-related osteoporosis without current pathological fracture: Secondary | ICD-10-CM | POA: Diagnosis not present

## 2016-01-27 DIAGNOSIS — I10 Essential (primary) hypertension: Secondary | ICD-10-CM | POA: Diagnosis not present

## 2016-01-27 DIAGNOSIS — M810 Age-related osteoporosis without current pathological fracture: Secondary | ICD-10-CM | POA: Diagnosis not present

## 2016-01-27 DIAGNOSIS — R412 Retrograde amnesia: Secondary | ICD-10-CM | POA: Diagnosis not present

## 2016-01-27 DIAGNOSIS — F419 Anxiety disorder, unspecified: Secondary | ICD-10-CM | POA: Diagnosis not present

## 2016-01-28 DIAGNOSIS — F419 Anxiety disorder, unspecified: Secondary | ICD-10-CM | POA: Diagnosis not present

## 2016-01-28 DIAGNOSIS — M810 Age-related osteoporosis without current pathological fracture: Secondary | ICD-10-CM | POA: Diagnosis not present

## 2016-01-28 DIAGNOSIS — I10 Essential (primary) hypertension: Secondary | ICD-10-CM | POA: Diagnosis not present

## 2016-01-28 DIAGNOSIS — R412 Retrograde amnesia: Secondary | ICD-10-CM | POA: Diagnosis not present

## 2016-01-29 DIAGNOSIS — F419 Anxiety disorder, unspecified: Secondary | ICD-10-CM | POA: Diagnosis not present

## 2016-01-29 DIAGNOSIS — R412 Retrograde amnesia: Secondary | ICD-10-CM | POA: Diagnosis not present

## 2016-01-29 DIAGNOSIS — I10 Essential (primary) hypertension: Secondary | ICD-10-CM | POA: Diagnosis not present

## 2016-01-29 DIAGNOSIS — M810 Age-related osteoporosis without current pathological fracture: Secondary | ICD-10-CM | POA: Diagnosis not present

## 2016-01-30 DIAGNOSIS — I10 Essential (primary) hypertension: Secondary | ICD-10-CM | POA: Diagnosis not present

## 2016-01-30 DIAGNOSIS — R412 Retrograde amnesia: Secondary | ICD-10-CM | POA: Diagnosis not present

## 2016-01-30 DIAGNOSIS — F419 Anxiety disorder, unspecified: Secondary | ICD-10-CM | POA: Diagnosis not present

## 2016-01-30 DIAGNOSIS — M810 Age-related osteoporosis without current pathological fracture: Secondary | ICD-10-CM | POA: Diagnosis not present

## 2016-01-31 DIAGNOSIS — F419 Anxiety disorder, unspecified: Secondary | ICD-10-CM | POA: Diagnosis not present

## 2016-01-31 DIAGNOSIS — R412 Retrograde amnesia: Secondary | ICD-10-CM | POA: Diagnosis not present

## 2016-01-31 DIAGNOSIS — I10 Essential (primary) hypertension: Secondary | ICD-10-CM | POA: Diagnosis not present

## 2016-01-31 DIAGNOSIS — M810 Age-related osteoporosis without current pathological fracture: Secondary | ICD-10-CM | POA: Diagnosis not present

## 2016-02-01 DIAGNOSIS — R412 Retrograde amnesia: Secondary | ICD-10-CM | POA: Diagnosis not present

## 2016-02-01 DIAGNOSIS — F419 Anxiety disorder, unspecified: Secondary | ICD-10-CM | POA: Diagnosis not present

## 2016-02-01 DIAGNOSIS — M810 Age-related osteoporosis without current pathological fracture: Secondary | ICD-10-CM | POA: Diagnosis not present

## 2016-02-01 DIAGNOSIS — I10 Essential (primary) hypertension: Secondary | ICD-10-CM | POA: Diagnosis not present

## 2016-02-02 DIAGNOSIS — F419 Anxiety disorder, unspecified: Secondary | ICD-10-CM | POA: Diagnosis not present

## 2016-02-02 DIAGNOSIS — I10 Essential (primary) hypertension: Secondary | ICD-10-CM | POA: Diagnosis not present

## 2016-02-02 DIAGNOSIS — M810 Age-related osteoporosis without current pathological fracture: Secondary | ICD-10-CM | POA: Diagnosis not present

## 2016-02-02 DIAGNOSIS — R412 Retrograde amnesia: Secondary | ICD-10-CM | POA: Diagnosis not present

## 2016-02-03 DIAGNOSIS — R412 Retrograde amnesia: Secondary | ICD-10-CM | POA: Diagnosis not present

## 2016-02-03 DIAGNOSIS — F419 Anxiety disorder, unspecified: Secondary | ICD-10-CM | POA: Diagnosis not present

## 2016-02-03 DIAGNOSIS — I10 Essential (primary) hypertension: Secondary | ICD-10-CM | POA: Diagnosis not present

## 2016-02-03 DIAGNOSIS — M810 Age-related osteoporosis without current pathological fracture: Secondary | ICD-10-CM | POA: Diagnosis not present

## 2016-02-04 DIAGNOSIS — F419 Anxiety disorder, unspecified: Secondary | ICD-10-CM | POA: Diagnosis not present

## 2016-02-04 DIAGNOSIS — R412 Retrograde amnesia: Secondary | ICD-10-CM | POA: Diagnosis not present

## 2016-02-04 DIAGNOSIS — M810 Age-related osteoporosis without current pathological fracture: Secondary | ICD-10-CM | POA: Diagnosis not present

## 2016-02-04 DIAGNOSIS — I10 Essential (primary) hypertension: Secondary | ICD-10-CM | POA: Diagnosis not present

## 2016-02-05 DIAGNOSIS — I10 Essential (primary) hypertension: Secondary | ICD-10-CM | POA: Diagnosis not present

## 2016-02-05 DIAGNOSIS — M810 Age-related osteoporosis without current pathological fracture: Secondary | ICD-10-CM | POA: Diagnosis not present

## 2016-02-05 DIAGNOSIS — F419 Anxiety disorder, unspecified: Secondary | ICD-10-CM | POA: Diagnosis not present

## 2016-02-05 DIAGNOSIS — R412 Retrograde amnesia: Secondary | ICD-10-CM | POA: Diagnosis not present

## 2016-02-06 DIAGNOSIS — F419 Anxiety disorder, unspecified: Secondary | ICD-10-CM | POA: Diagnosis not present

## 2016-02-06 DIAGNOSIS — R412 Retrograde amnesia: Secondary | ICD-10-CM | POA: Diagnosis not present

## 2016-02-06 DIAGNOSIS — M810 Age-related osteoporosis without current pathological fracture: Secondary | ICD-10-CM | POA: Diagnosis not present

## 2016-02-06 DIAGNOSIS — I10 Essential (primary) hypertension: Secondary | ICD-10-CM | POA: Diagnosis not present

## 2016-02-07 DIAGNOSIS — R412 Retrograde amnesia: Secondary | ICD-10-CM | POA: Diagnosis not present

## 2016-02-07 DIAGNOSIS — I10 Essential (primary) hypertension: Secondary | ICD-10-CM | POA: Diagnosis not present

## 2016-02-07 DIAGNOSIS — M810 Age-related osteoporosis without current pathological fracture: Secondary | ICD-10-CM | POA: Diagnosis not present

## 2016-02-07 DIAGNOSIS — F419 Anxiety disorder, unspecified: Secondary | ICD-10-CM | POA: Diagnosis not present

## 2016-02-08 DIAGNOSIS — I10 Essential (primary) hypertension: Secondary | ICD-10-CM | POA: Diagnosis not present

## 2016-02-08 DIAGNOSIS — R412 Retrograde amnesia: Secondary | ICD-10-CM | POA: Diagnosis not present

## 2016-02-08 DIAGNOSIS — F419 Anxiety disorder, unspecified: Secondary | ICD-10-CM | POA: Diagnosis not present

## 2016-02-08 DIAGNOSIS — M810 Age-related osteoporosis without current pathological fracture: Secondary | ICD-10-CM | POA: Diagnosis not present

## 2016-02-09 DIAGNOSIS — M810 Age-related osteoporosis without current pathological fracture: Secondary | ICD-10-CM | POA: Diagnosis not present

## 2016-02-09 DIAGNOSIS — F419 Anxiety disorder, unspecified: Secondary | ICD-10-CM | POA: Diagnosis not present

## 2016-02-09 DIAGNOSIS — I10 Essential (primary) hypertension: Secondary | ICD-10-CM | POA: Diagnosis not present

## 2016-02-09 DIAGNOSIS — R412 Retrograde amnesia: Secondary | ICD-10-CM | POA: Diagnosis not present

## 2016-02-10 DIAGNOSIS — I10 Essential (primary) hypertension: Secondary | ICD-10-CM | POA: Diagnosis not present

## 2016-02-10 DIAGNOSIS — F419 Anxiety disorder, unspecified: Secondary | ICD-10-CM | POA: Diagnosis not present

## 2016-02-10 DIAGNOSIS — R412 Retrograde amnesia: Secondary | ICD-10-CM | POA: Diagnosis not present

## 2016-02-10 DIAGNOSIS — M810 Age-related osteoporosis without current pathological fracture: Secondary | ICD-10-CM | POA: Diagnosis not present

## 2016-02-11 DIAGNOSIS — I10 Essential (primary) hypertension: Secondary | ICD-10-CM | POA: Diagnosis not present

## 2016-02-11 DIAGNOSIS — M810 Age-related osteoporosis without current pathological fracture: Secondary | ICD-10-CM | POA: Diagnosis not present

## 2016-02-11 DIAGNOSIS — R412 Retrograde amnesia: Secondary | ICD-10-CM | POA: Diagnosis not present

## 2016-02-11 DIAGNOSIS — F419 Anxiety disorder, unspecified: Secondary | ICD-10-CM | POA: Diagnosis not present

## 2016-02-12 DIAGNOSIS — R412 Retrograde amnesia: Secondary | ICD-10-CM | POA: Diagnosis not present

## 2016-02-12 DIAGNOSIS — I10 Essential (primary) hypertension: Secondary | ICD-10-CM | POA: Diagnosis not present

## 2016-02-12 DIAGNOSIS — F419 Anxiety disorder, unspecified: Secondary | ICD-10-CM | POA: Diagnosis not present

## 2016-02-12 DIAGNOSIS — M810 Age-related osteoporosis without current pathological fracture: Secondary | ICD-10-CM | POA: Diagnosis not present

## 2016-02-13 DIAGNOSIS — M810 Age-related osteoporosis without current pathological fracture: Secondary | ICD-10-CM | POA: Diagnosis not present

## 2016-02-13 DIAGNOSIS — R412 Retrograde amnesia: Secondary | ICD-10-CM | POA: Diagnosis not present

## 2016-02-13 DIAGNOSIS — I10 Essential (primary) hypertension: Secondary | ICD-10-CM | POA: Diagnosis not present

## 2016-02-13 DIAGNOSIS — F419 Anxiety disorder, unspecified: Secondary | ICD-10-CM | POA: Diagnosis not present

## 2016-02-14 DIAGNOSIS — M810 Age-related osteoporosis without current pathological fracture: Secondary | ICD-10-CM | POA: Diagnosis not present

## 2016-02-14 DIAGNOSIS — R412 Retrograde amnesia: Secondary | ICD-10-CM | POA: Diagnosis not present

## 2016-02-14 DIAGNOSIS — I10 Essential (primary) hypertension: Secondary | ICD-10-CM | POA: Diagnosis not present

## 2016-02-14 DIAGNOSIS — F419 Anxiety disorder, unspecified: Secondary | ICD-10-CM | POA: Diagnosis not present

## 2016-02-15 DIAGNOSIS — M810 Age-related osteoporosis without current pathological fracture: Secondary | ICD-10-CM | POA: Diagnosis not present

## 2016-02-15 DIAGNOSIS — I10 Essential (primary) hypertension: Secondary | ICD-10-CM | POA: Diagnosis not present

## 2016-02-15 DIAGNOSIS — R412 Retrograde amnesia: Secondary | ICD-10-CM | POA: Diagnosis not present

## 2016-02-15 DIAGNOSIS — F419 Anxiety disorder, unspecified: Secondary | ICD-10-CM | POA: Diagnosis not present

## 2016-02-16 DIAGNOSIS — Z6824 Body mass index (BMI) 24.0-24.9, adult: Secondary | ICD-10-CM | POA: Diagnosis not present

## 2016-02-16 DIAGNOSIS — I10 Essential (primary) hypertension: Secondary | ICD-10-CM | POA: Diagnosis not present

## 2016-02-16 DIAGNOSIS — R412 Retrograde amnesia: Secondary | ICD-10-CM | POA: Diagnosis not present

## 2016-02-16 DIAGNOSIS — F419 Anxiety disorder, unspecified: Secondary | ICD-10-CM | POA: Diagnosis not present

## 2016-02-16 DIAGNOSIS — M810 Age-related osteoporosis without current pathological fracture: Secondary | ICD-10-CM | POA: Diagnosis not present

## 2016-02-17 DIAGNOSIS — R412 Retrograde amnesia: Secondary | ICD-10-CM | POA: Diagnosis not present

## 2016-02-17 DIAGNOSIS — F419 Anxiety disorder, unspecified: Secondary | ICD-10-CM | POA: Diagnosis not present

## 2016-02-17 DIAGNOSIS — I10 Essential (primary) hypertension: Secondary | ICD-10-CM | POA: Diagnosis not present

## 2016-02-17 DIAGNOSIS — M810 Age-related osteoporosis without current pathological fracture: Secondary | ICD-10-CM | POA: Diagnosis not present

## 2016-02-18 DIAGNOSIS — F419 Anxiety disorder, unspecified: Secondary | ICD-10-CM | POA: Diagnosis not present

## 2016-02-18 DIAGNOSIS — M810 Age-related osteoporosis without current pathological fracture: Secondary | ICD-10-CM | POA: Diagnosis not present

## 2016-02-18 DIAGNOSIS — I10 Essential (primary) hypertension: Secondary | ICD-10-CM | POA: Diagnosis not present

## 2016-02-18 DIAGNOSIS — R412 Retrograde amnesia: Secondary | ICD-10-CM | POA: Diagnosis not present

## 2016-02-19 DIAGNOSIS — R412 Retrograde amnesia: Secondary | ICD-10-CM | POA: Diagnosis not present

## 2016-02-19 DIAGNOSIS — M810 Age-related osteoporosis without current pathological fracture: Secondary | ICD-10-CM | POA: Diagnosis not present

## 2016-02-19 DIAGNOSIS — I10 Essential (primary) hypertension: Secondary | ICD-10-CM | POA: Diagnosis not present

## 2016-02-19 DIAGNOSIS — F419 Anxiety disorder, unspecified: Secondary | ICD-10-CM | POA: Diagnosis not present

## 2016-02-20 DIAGNOSIS — F419 Anxiety disorder, unspecified: Secondary | ICD-10-CM | POA: Diagnosis not present

## 2016-02-20 DIAGNOSIS — R412 Retrograde amnesia: Secondary | ICD-10-CM | POA: Diagnosis not present

## 2016-02-20 DIAGNOSIS — I10 Essential (primary) hypertension: Secondary | ICD-10-CM | POA: Diagnosis not present

## 2016-02-20 DIAGNOSIS — M810 Age-related osteoporosis without current pathological fracture: Secondary | ICD-10-CM | POA: Diagnosis not present

## 2016-02-21 DIAGNOSIS — F419 Anxiety disorder, unspecified: Secondary | ICD-10-CM | POA: Diagnosis not present

## 2016-02-21 DIAGNOSIS — R412 Retrograde amnesia: Secondary | ICD-10-CM | POA: Diagnosis not present

## 2016-02-21 DIAGNOSIS — I10 Essential (primary) hypertension: Secondary | ICD-10-CM | POA: Diagnosis not present

## 2016-02-21 DIAGNOSIS — M810 Age-related osteoporosis without current pathological fracture: Secondary | ICD-10-CM | POA: Diagnosis not present

## 2016-02-22 DIAGNOSIS — I10 Essential (primary) hypertension: Secondary | ICD-10-CM | POA: Diagnosis not present

## 2016-02-22 DIAGNOSIS — R412 Retrograde amnesia: Secondary | ICD-10-CM | POA: Diagnosis not present

## 2016-02-22 DIAGNOSIS — F419 Anxiety disorder, unspecified: Secondary | ICD-10-CM | POA: Diagnosis not present

## 2016-02-22 DIAGNOSIS — M810 Age-related osteoporosis without current pathological fracture: Secondary | ICD-10-CM | POA: Diagnosis not present

## 2016-02-23 DIAGNOSIS — I10 Essential (primary) hypertension: Secondary | ICD-10-CM | POA: Diagnosis not present

## 2016-02-23 DIAGNOSIS — M810 Age-related osteoporosis without current pathological fracture: Secondary | ICD-10-CM | POA: Diagnosis not present

## 2016-02-23 DIAGNOSIS — R412 Retrograde amnesia: Secondary | ICD-10-CM | POA: Diagnosis not present

## 2016-02-23 DIAGNOSIS — F419 Anxiety disorder, unspecified: Secondary | ICD-10-CM | POA: Diagnosis not present

## 2016-02-24 DIAGNOSIS — R412 Retrograde amnesia: Secondary | ICD-10-CM | POA: Diagnosis not present

## 2016-02-24 DIAGNOSIS — F419 Anxiety disorder, unspecified: Secondary | ICD-10-CM | POA: Diagnosis not present

## 2016-02-24 DIAGNOSIS — I10 Essential (primary) hypertension: Secondary | ICD-10-CM | POA: Diagnosis not present

## 2016-02-24 DIAGNOSIS — M810 Age-related osteoporosis without current pathological fracture: Secondary | ICD-10-CM | POA: Diagnosis not present

## 2016-02-25 DIAGNOSIS — M810 Age-related osteoporosis without current pathological fracture: Secondary | ICD-10-CM | POA: Diagnosis not present

## 2016-02-25 DIAGNOSIS — I10 Essential (primary) hypertension: Secondary | ICD-10-CM | POA: Diagnosis not present

## 2016-02-25 DIAGNOSIS — F419 Anxiety disorder, unspecified: Secondary | ICD-10-CM | POA: Diagnosis not present

## 2016-02-25 DIAGNOSIS — R412 Retrograde amnesia: Secondary | ICD-10-CM | POA: Diagnosis not present

## 2016-02-26 DIAGNOSIS — F419 Anxiety disorder, unspecified: Secondary | ICD-10-CM | POA: Diagnosis not present

## 2016-02-26 DIAGNOSIS — I10 Essential (primary) hypertension: Secondary | ICD-10-CM | POA: Diagnosis not present

## 2016-02-26 DIAGNOSIS — R412 Retrograde amnesia: Secondary | ICD-10-CM | POA: Diagnosis not present

## 2016-02-26 DIAGNOSIS — M810 Age-related osteoporosis without current pathological fracture: Secondary | ICD-10-CM | POA: Diagnosis not present

## 2016-02-27 DIAGNOSIS — F419 Anxiety disorder, unspecified: Secondary | ICD-10-CM | POA: Diagnosis not present

## 2016-02-27 DIAGNOSIS — M810 Age-related osteoporosis without current pathological fracture: Secondary | ICD-10-CM | POA: Diagnosis not present

## 2016-02-27 DIAGNOSIS — R412 Retrograde amnesia: Secondary | ICD-10-CM | POA: Diagnosis not present

## 2016-02-27 DIAGNOSIS — I10 Essential (primary) hypertension: Secondary | ICD-10-CM | POA: Diagnosis not present

## 2016-02-28 DIAGNOSIS — R412 Retrograde amnesia: Secondary | ICD-10-CM | POA: Diagnosis not present

## 2016-02-28 DIAGNOSIS — F419 Anxiety disorder, unspecified: Secondary | ICD-10-CM | POA: Diagnosis not present

## 2016-02-28 DIAGNOSIS — M810 Age-related osteoporosis without current pathological fracture: Secondary | ICD-10-CM | POA: Diagnosis not present

## 2016-02-28 DIAGNOSIS — I10 Essential (primary) hypertension: Secondary | ICD-10-CM | POA: Diagnosis not present

## 2016-02-29 DIAGNOSIS — R412 Retrograde amnesia: Secondary | ICD-10-CM | POA: Diagnosis not present

## 2016-02-29 DIAGNOSIS — M810 Age-related osteoporosis without current pathological fracture: Secondary | ICD-10-CM | POA: Diagnosis not present

## 2016-02-29 DIAGNOSIS — I10 Essential (primary) hypertension: Secondary | ICD-10-CM | POA: Diagnosis not present

## 2016-02-29 DIAGNOSIS — F419 Anxiety disorder, unspecified: Secondary | ICD-10-CM | POA: Diagnosis not present

## 2016-03-01 DIAGNOSIS — F419 Anxiety disorder, unspecified: Secondary | ICD-10-CM | POA: Diagnosis not present

## 2016-03-01 DIAGNOSIS — I10 Essential (primary) hypertension: Secondary | ICD-10-CM | POA: Diagnosis not present

## 2016-03-01 DIAGNOSIS — R412 Retrograde amnesia: Secondary | ICD-10-CM | POA: Diagnosis not present

## 2016-03-01 DIAGNOSIS — M810 Age-related osteoporosis without current pathological fracture: Secondary | ICD-10-CM | POA: Diagnosis not present

## 2016-03-02 DIAGNOSIS — M810 Age-related osteoporosis without current pathological fracture: Secondary | ICD-10-CM | POA: Diagnosis not present

## 2016-03-02 DIAGNOSIS — F419 Anxiety disorder, unspecified: Secondary | ICD-10-CM | POA: Diagnosis not present

## 2016-03-02 DIAGNOSIS — I10 Essential (primary) hypertension: Secondary | ICD-10-CM | POA: Diagnosis not present

## 2016-03-02 DIAGNOSIS — R412 Retrograde amnesia: Secondary | ICD-10-CM | POA: Diagnosis not present

## 2016-03-03 DIAGNOSIS — F419 Anxiety disorder, unspecified: Secondary | ICD-10-CM | POA: Diagnosis not present

## 2016-03-03 DIAGNOSIS — I10 Essential (primary) hypertension: Secondary | ICD-10-CM | POA: Diagnosis not present

## 2016-03-03 DIAGNOSIS — M810 Age-related osteoporosis without current pathological fracture: Secondary | ICD-10-CM | POA: Diagnosis not present

## 2016-03-03 DIAGNOSIS — R412 Retrograde amnesia: Secondary | ICD-10-CM | POA: Diagnosis not present

## 2016-03-04 DIAGNOSIS — I10 Essential (primary) hypertension: Secondary | ICD-10-CM | POA: Diagnosis not present

## 2016-03-04 DIAGNOSIS — R412 Retrograde amnesia: Secondary | ICD-10-CM | POA: Diagnosis not present

## 2016-03-04 DIAGNOSIS — F419 Anxiety disorder, unspecified: Secondary | ICD-10-CM | POA: Diagnosis not present

## 2016-03-04 DIAGNOSIS — M810 Age-related osteoporosis without current pathological fracture: Secondary | ICD-10-CM | POA: Diagnosis not present

## 2016-03-05 DIAGNOSIS — M810 Age-related osteoporosis without current pathological fracture: Secondary | ICD-10-CM | POA: Diagnosis not present

## 2016-03-05 DIAGNOSIS — R412 Retrograde amnesia: Secondary | ICD-10-CM | POA: Diagnosis not present

## 2016-03-05 DIAGNOSIS — I10 Essential (primary) hypertension: Secondary | ICD-10-CM | POA: Diagnosis not present

## 2016-03-05 DIAGNOSIS — F419 Anxiety disorder, unspecified: Secondary | ICD-10-CM | POA: Diagnosis not present

## 2016-03-06 DIAGNOSIS — I10 Essential (primary) hypertension: Secondary | ICD-10-CM | POA: Diagnosis not present

## 2016-03-06 DIAGNOSIS — M810 Age-related osteoporosis without current pathological fracture: Secondary | ICD-10-CM | POA: Diagnosis not present

## 2016-03-06 DIAGNOSIS — F419 Anxiety disorder, unspecified: Secondary | ICD-10-CM | POA: Diagnosis not present

## 2016-03-06 DIAGNOSIS — R412 Retrograde amnesia: Secondary | ICD-10-CM | POA: Diagnosis not present

## 2016-03-07 DIAGNOSIS — I10 Essential (primary) hypertension: Secondary | ICD-10-CM | POA: Diagnosis not present

## 2016-03-07 DIAGNOSIS — R412 Retrograde amnesia: Secondary | ICD-10-CM | POA: Diagnosis not present

## 2016-03-07 DIAGNOSIS — F419 Anxiety disorder, unspecified: Secondary | ICD-10-CM | POA: Diagnosis not present

## 2016-03-07 DIAGNOSIS — M810 Age-related osteoporosis without current pathological fracture: Secondary | ICD-10-CM | POA: Diagnosis not present

## 2016-03-08 DIAGNOSIS — F419 Anxiety disorder, unspecified: Secondary | ICD-10-CM | POA: Diagnosis not present

## 2016-03-08 DIAGNOSIS — R412 Retrograde amnesia: Secondary | ICD-10-CM | POA: Diagnosis not present

## 2016-03-08 DIAGNOSIS — M810 Age-related osteoporosis without current pathological fracture: Secondary | ICD-10-CM | POA: Diagnosis not present

## 2016-03-08 DIAGNOSIS — I10 Essential (primary) hypertension: Secondary | ICD-10-CM | POA: Diagnosis not present

## 2016-03-09 DIAGNOSIS — M810 Age-related osteoporosis without current pathological fracture: Secondary | ICD-10-CM | POA: Diagnosis not present

## 2016-03-09 DIAGNOSIS — F419 Anxiety disorder, unspecified: Secondary | ICD-10-CM | POA: Diagnosis not present

## 2016-03-09 DIAGNOSIS — I10 Essential (primary) hypertension: Secondary | ICD-10-CM | POA: Diagnosis not present

## 2016-03-09 DIAGNOSIS — R412 Retrograde amnesia: Secondary | ICD-10-CM | POA: Diagnosis not present

## 2016-03-10 DIAGNOSIS — M810 Age-related osteoporosis without current pathological fracture: Secondary | ICD-10-CM | POA: Diagnosis not present

## 2016-03-10 DIAGNOSIS — F419 Anxiety disorder, unspecified: Secondary | ICD-10-CM | POA: Diagnosis not present

## 2016-03-10 DIAGNOSIS — I10 Essential (primary) hypertension: Secondary | ICD-10-CM | POA: Diagnosis not present

## 2016-03-10 DIAGNOSIS — R412 Retrograde amnesia: Secondary | ICD-10-CM | POA: Diagnosis not present

## 2016-03-11 DIAGNOSIS — R412 Retrograde amnesia: Secondary | ICD-10-CM | POA: Diagnosis not present

## 2016-03-11 DIAGNOSIS — I10 Essential (primary) hypertension: Secondary | ICD-10-CM | POA: Diagnosis not present

## 2016-03-11 DIAGNOSIS — M810 Age-related osteoporosis without current pathological fracture: Secondary | ICD-10-CM | POA: Diagnosis not present

## 2016-03-11 DIAGNOSIS — F419 Anxiety disorder, unspecified: Secondary | ICD-10-CM | POA: Diagnosis not present

## 2016-03-12 DIAGNOSIS — F419 Anxiety disorder, unspecified: Secondary | ICD-10-CM | POA: Diagnosis not present

## 2016-03-12 DIAGNOSIS — R412 Retrograde amnesia: Secondary | ICD-10-CM | POA: Diagnosis not present

## 2016-03-12 DIAGNOSIS — I10 Essential (primary) hypertension: Secondary | ICD-10-CM | POA: Diagnosis not present

## 2016-03-12 DIAGNOSIS — M810 Age-related osteoporosis without current pathological fracture: Secondary | ICD-10-CM | POA: Diagnosis not present

## 2016-03-13 DIAGNOSIS — M810 Age-related osteoporosis without current pathological fracture: Secondary | ICD-10-CM | POA: Diagnosis not present

## 2016-03-13 DIAGNOSIS — F419 Anxiety disorder, unspecified: Secondary | ICD-10-CM | POA: Diagnosis not present

## 2016-03-13 DIAGNOSIS — I10 Essential (primary) hypertension: Secondary | ICD-10-CM | POA: Diagnosis not present

## 2016-03-13 DIAGNOSIS — R412 Retrograde amnesia: Secondary | ICD-10-CM | POA: Diagnosis not present

## 2016-03-14 DIAGNOSIS — F419 Anxiety disorder, unspecified: Secondary | ICD-10-CM | POA: Diagnosis not present

## 2016-03-14 DIAGNOSIS — R412 Retrograde amnesia: Secondary | ICD-10-CM | POA: Diagnosis not present

## 2016-03-14 DIAGNOSIS — I10 Essential (primary) hypertension: Secondary | ICD-10-CM | POA: Diagnosis not present

## 2016-03-14 DIAGNOSIS — M810 Age-related osteoporosis without current pathological fracture: Secondary | ICD-10-CM | POA: Diagnosis not present

## 2016-03-15 DIAGNOSIS — R412 Retrograde amnesia: Secondary | ICD-10-CM | POA: Diagnosis not present

## 2016-03-15 DIAGNOSIS — F419 Anxiety disorder, unspecified: Secondary | ICD-10-CM | POA: Diagnosis not present

## 2016-03-15 DIAGNOSIS — M810 Age-related osteoporosis without current pathological fracture: Secondary | ICD-10-CM | POA: Diagnosis not present

## 2016-03-15 DIAGNOSIS — I10 Essential (primary) hypertension: Secondary | ICD-10-CM | POA: Diagnosis not present

## 2016-03-16 DIAGNOSIS — I10 Essential (primary) hypertension: Secondary | ICD-10-CM | POA: Diagnosis not present

## 2016-03-16 DIAGNOSIS — F419 Anxiety disorder, unspecified: Secondary | ICD-10-CM | POA: Diagnosis not present

## 2016-03-16 DIAGNOSIS — R412 Retrograde amnesia: Secondary | ICD-10-CM | POA: Diagnosis not present

## 2016-03-16 DIAGNOSIS — M810 Age-related osteoporosis without current pathological fracture: Secondary | ICD-10-CM | POA: Diagnosis not present

## 2016-03-17 DIAGNOSIS — I10 Essential (primary) hypertension: Secondary | ICD-10-CM | POA: Diagnosis not present

## 2016-03-17 DIAGNOSIS — F419 Anxiety disorder, unspecified: Secondary | ICD-10-CM | POA: Diagnosis not present

## 2016-03-17 DIAGNOSIS — R412 Retrograde amnesia: Secondary | ICD-10-CM | POA: Diagnosis not present

## 2016-03-17 DIAGNOSIS — M810 Age-related osteoporosis without current pathological fracture: Secondary | ICD-10-CM | POA: Diagnosis not present

## 2016-03-18 DIAGNOSIS — F419 Anxiety disorder, unspecified: Secondary | ICD-10-CM | POA: Diagnosis not present

## 2016-03-18 DIAGNOSIS — I10 Essential (primary) hypertension: Secondary | ICD-10-CM | POA: Diagnosis not present

## 2016-03-18 DIAGNOSIS — R412 Retrograde amnesia: Secondary | ICD-10-CM | POA: Diagnosis not present

## 2016-03-18 DIAGNOSIS — M810 Age-related osteoporosis without current pathological fracture: Secondary | ICD-10-CM | POA: Diagnosis not present

## 2016-03-19 DIAGNOSIS — I10 Essential (primary) hypertension: Secondary | ICD-10-CM | POA: Diagnosis not present

## 2016-03-19 DIAGNOSIS — M810 Age-related osteoporosis without current pathological fracture: Secondary | ICD-10-CM | POA: Diagnosis not present

## 2016-03-19 DIAGNOSIS — F419 Anxiety disorder, unspecified: Secondary | ICD-10-CM | POA: Diagnosis not present

## 2016-03-19 DIAGNOSIS — R412 Retrograde amnesia: Secondary | ICD-10-CM | POA: Diagnosis not present

## 2016-03-20 DIAGNOSIS — R412 Retrograde amnesia: Secondary | ICD-10-CM | POA: Diagnosis not present

## 2016-03-20 DIAGNOSIS — M810 Age-related osteoporosis without current pathological fracture: Secondary | ICD-10-CM | POA: Diagnosis not present

## 2016-03-20 DIAGNOSIS — I10 Essential (primary) hypertension: Secondary | ICD-10-CM | POA: Diagnosis not present

## 2016-03-20 DIAGNOSIS — F419 Anxiety disorder, unspecified: Secondary | ICD-10-CM | POA: Diagnosis not present

## 2016-03-21 DIAGNOSIS — M810 Age-related osteoporosis without current pathological fracture: Secondary | ICD-10-CM | POA: Diagnosis not present

## 2016-03-21 DIAGNOSIS — R412 Retrograde amnesia: Secondary | ICD-10-CM | POA: Diagnosis not present

## 2016-03-21 DIAGNOSIS — F419 Anxiety disorder, unspecified: Secondary | ICD-10-CM | POA: Diagnosis not present

## 2016-03-21 DIAGNOSIS — I10 Essential (primary) hypertension: Secondary | ICD-10-CM | POA: Diagnosis not present

## 2016-03-22 DIAGNOSIS — I10 Essential (primary) hypertension: Secondary | ICD-10-CM | POA: Diagnosis not present

## 2016-03-22 DIAGNOSIS — R412 Retrograde amnesia: Secondary | ICD-10-CM | POA: Diagnosis not present

## 2016-03-22 DIAGNOSIS — M810 Age-related osteoporosis without current pathological fracture: Secondary | ICD-10-CM | POA: Diagnosis not present

## 2016-03-22 DIAGNOSIS — F419 Anxiety disorder, unspecified: Secondary | ICD-10-CM | POA: Diagnosis not present

## 2016-03-23 DIAGNOSIS — F419 Anxiety disorder, unspecified: Secondary | ICD-10-CM | POA: Diagnosis not present

## 2016-03-23 DIAGNOSIS — M810 Age-related osteoporosis without current pathological fracture: Secondary | ICD-10-CM | POA: Diagnosis not present

## 2016-03-23 DIAGNOSIS — I10 Essential (primary) hypertension: Secondary | ICD-10-CM | POA: Diagnosis not present

## 2016-03-23 DIAGNOSIS — R412 Retrograde amnesia: Secondary | ICD-10-CM | POA: Diagnosis not present

## 2016-03-24 DIAGNOSIS — R412 Retrograde amnesia: Secondary | ICD-10-CM | POA: Diagnosis not present

## 2016-03-24 DIAGNOSIS — F419 Anxiety disorder, unspecified: Secondary | ICD-10-CM | POA: Diagnosis not present

## 2016-03-24 DIAGNOSIS — M810 Age-related osteoporosis without current pathological fracture: Secondary | ICD-10-CM | POA: Diagnosis not present

## 2016-03-24 DIAGNOSIS — I10 Essential (primary) hypertension: Secondary | ICD-10-CM | POA: Diagnosis not present

## 2016-03-25 DIAGNOSIS — R412 Retrograde amnesia: Secondary | ICD-10-CM | POA: Diagnosis not present

## 2016-03-25 DIAGNOSIS — I10 Essential (primary) hypertension: Secondary | ICD-10-CM | POA: Diagnosis not present

## 2016-03-25 DIAGNOSIS — F419 Anxiety disorder, unspecified: Secondary | ICD-10-CM | POA: Diagnosis not present

## 2016-03-25 DIAGNOSIS — M810 Age-related osteoporosis without current pathological fracture: Secondary | ICD-10-CM | POA: Diagnosis not present

## 2016-03-26 DIAGNOSIS — I10 Essential (primary) hypertension: Secondary | ICD-10-CM | POA: Diagnosis not present

## 2016-03-26 DIAGNOSIS — M810 Age-related osteoporosis without current pathological fracture: Secondary | ICD-10-CM | POA: Diagnosis not present

## 2016-03-26 DIAGNOSIS — F419 Anxiety disorder, unspecified: Secondary | ICD-10-CM | POA: Diagnosis not present

## 2016-03-26 DIAGNOSIS — R412 Retrograde amnesia: Secondary | ICD-10-CM | POA: Diagnosis not present

## 2016-03-27 DIAGNOSIS — F419 Anxiety disorder, unspecified: Secondary | ICD-10-CM | POA: Diagnosis not present

## 2016-03-27 DIAGNOSIS — R412 Retrograde amnesia: Secondary | ICD-10-CM | POA: Diagnosis not present

## 2016-03-27 DIAGNOSIS — I10 Essential (primary) hypertension: Secondary | ICD-10-CM | POA: Diagnosis not present

## 2016-03-27 DIAGNOSIS — M810 Age-related osteoporosis without current pathological fracture: Secondary | ICD-10-CM | POA: Diagnosis not present

## 2016-03-28 DIAGNOSIS — F419 Anxiety disorder, unspecified: Secondary | ICD-10-CM | POA: Diagnosis not present

## 2016-03-28 DIAGNOSIS — M810 Age-related osteoporosis without current pathological fracture: Secondary | ICD-10-CM | POA: Diagnosis not present

## 2016-03-28 DIAGNOSIS — I10 Essential (primary) hypertension: Secondary | ICD-10-CM | POA: Diagnosis not present

## 2016-03-28 DIAGNOSIS — R412 Retrograde amnesia: Secondary | ICD-10-CM | POA: Diagnosis not present

## 2016-03-29 DIAGNOSIS — F419 Anxiety disorder, unspecified: Secondary | ICD-10-CM | POA: Diagnosis not present

## 2016-03-29 DIAGNOSIS — I10 Essential (primary) hypertension: Secondary | ICD-10-CM | POA: Diagnosis not present

## 2016-03-29 DIAGNOSIS — M810 Age-related osteoporosis without current pathological fracture: Secondary | ICD-10-CM | POA: Diagnosis not present

## 2016-03-29 DIAGNOSIS — R412 Retrograde amnesia: Secondary | ICD-10-CM | POA: Diagnosis not present

## 2016-03-30 DIAGNOSIS — R412 Retrograde amnesia: Secondary | ICD-10-CM | POA: Diagnosis not present

## 2016-03-30 DIAGNOSIS — F419 Anxiety disorder, unspecified: Secondary | ICD-10-CM | POA: Diagnosis not present

## 2016-03-30 DIAGNOSIS — I10 Essential (primary) hypertension: Secondary | ICD-10-CM | POA: Diagnosis not present

## 2016-03-30 DIAGNOSIS — M810 Age-related osteoporosis without current pathological fracture: Secondary | ICD-10-CM | POA: Diagnosis not present

## 2016-03-31 DIAGNOSIS — M810 Age-related osteoporosis without current pathological fracture: Secondary | ICD-10-CM | POA: Diagnosis not present

## 2016-03-31 DIAGNOSIS — R412 Retrograde amnesia: Secondary | ICD-10-CM | POA: Diagnosis not present

## 2016-03-31 DIAGNOSIS — I10 Essential (primary) hypertension: Secondary | ICD-10-CM | POA: Diagnosis not present

## 2016-03-31 DIAGNOSIS — F419 Anxiety disorder, unspecified: Secondary | ICD-10-CM | POA: Diagnosis not present

## 2016-04-01 DIAGNOSIS — I10 Essential (primary) hypertension: Secondary | ICD-10-CM | POA: Diagnosis not present

## 2016-04-01 DIAGNOSIS — M810 Age-related osteoporosis without current pathological fracture: Secondary | ICD-10-CM | POA: Diagnosis not present

## 2016-04-01 DIAGNOSIS — F419 Anxiety disorder, unspecified: Secondary | ICD-10-CM | POA: Diagnosis not present

## 2016-04-01 DIAGNOSIS — R412 Retrograde amnesia: Secondary | ICD-10-CM | POA: Diagnosis not present

## 2016-04-02 DIAGNOSIS — R412 Retrograde amnesia: Secondary | ICD-10-CM | POA: Diagnosis not present

## 2016-04-02 DIAGNOSIS — M810 Age-related osteoporosis without current pathological fracture: Secondary | ICD-10-CM | POA: Diagnosis not present

## 2016-04-02 DIAGNOSIS — I10 Essential (primary) hypertension: Secondary | ICD-10-CM | POA: Diagnosis not present

## 2016-04-02 DIAGNOSIS — F419 Anxiety disorder, unspecified: Secondary | ICD-10-CM | POA: Diagnosis not present

## 2016-04-03 DIAGNOSIS — M810 Age-related osteoporosis without current pathological fracture: Secondary | ICD-10-CM | POA: Diagnosis not present

## 2016-04-03 DIAGNOSIS — I10 Essential (primary) hypertension: Secondary | ICD-10-CM | POA: Diagnosis not present

## 2016-04-03 DIAGNOSIS — F419 Anxiety disorder, unspecified: Secondary | ICD-10-CM | POA: Diagnosis not present

## 2016-04-03 DIAGNOSIS — R412 Retrograde amnesia: Secondary | ICD-10-CM | POA: Diagnosis not present

## 2016-04-04 DIAGNOSIS — M810 Age-related osteoporosis without current pathological fracture: Secondary | ICD-10-CM | POA: Diagnosis not present

## 2016-04-04 DIAGNOSIS — F419 Anxiety disorder, unspecified: Secondary | ICD-10-CM | POA: Diagnosis not present

## 2016-04-04 DIAGNOSIS — I10 Essential (primary) hypertension: Secondary | ICD-10-CM | POA: Diagnosis not present

## 2016-04-04 DIAGNOSIS — R412 Retrograde amnesia: Secondary | ICD-10-CM | POA: Diagnosis not present

## 2016-04-05 DIAGNOSIS — R412 Retrograde amnesia: Secondary | ICD-10-CM | POA: Diagnosis not present

## 2016-04-05 DIAGNOSIS — F419 Anxiety disorder, unspecified: Secondary | ICD-10-CM | POA: Diagnosis not present

## 2016-04-05 DIAGNOSIS — M810 Age-related osteoporosis without current pathological fracture: Secondary | ICD-10-CM | POA: Diagnosis not present

## 2016-04-05 DIAGNOSIS — I10 Essential (primary) hypertension: Secondary | ICD-10-CM | POA: Diagnosis not present

## 2016-04-06 DIAGNOSIS — R412 Retrograde amnesia: Secondary | ICD-10-CM | POA: Diagnosis not present

## 2016-04-06 DIAGNOSIS — M810 Age-related osteoporosis without current pathological fracture: Secondary | ICD-10-CM | POA: Diagnosis not present

## 2016-04-06 DIAGNOSIS — I10 Essential (primary) hypertension: Secondary | ICD-10-CM | POA: Diagnosis not present

## 2016-04-06 DIAGNOSIS — F419 Anxiety disorder, unspecified: Secondary | ICD-10-CM | POA: Diagnosis not present

## 2016-04-07 DIAGNOSIS — R412 Retrograde amnesia: Secondary | ICD-10-CM | POA: Diagnosis not present

## 2016-04-07 DIAGNOSIS — M810 Age-related osteoporosis without current pathological fracture: Secondary | ICD-10-CM | POA: Diagnosis not present

## 2016-04-07 DIAGNOSIS — I10 Essential (primary) hypertension: Secondary | ICD-10-CM | POA: Diagnosis not present

## 2016-04-07 DIAGNOSIS — F419 Anxiety disorder, unspecified: Secondary | ICD-10-CM | POA: Diagnosis not present

## 2016-04-08 DIAGNOSIS — F419 Anxiety disorder, unspecified: Secondary | ICD-10-CM | POA: Diagnosis not present

## 2016-04-08 DIAGNOSIS — R412 Retrograde amnesia: Secondary | ICD-10-CM | POA: Diagnosis not present

## 2016-04-08 DIAGNOSIS — I10 Essential (primary) hypertension: Secondary | ICD-10-CM | POA: Diagnosis not present

## 2016-04-08 DIAGNOSIS — M810 Age-related osteoporosis without current pathological fracture: Secondary | ICD-10-CM | POA: Diagnosis not present

## 2016-04-09 DIAGNOSIS — F419 Anxiety disorder, unspecified: Secondary | ICD-10-CM | POA: Diagnosis not present

## 2016-04-09 DIAGNOSIS — R412 Retrograde amnesia: Secondary | ICD-10-CM | POA: Diagnosis not present

## 2016-04-09 DIAGNOSIS — M810 Age-related osteoporosis without current pathological fracture: Secondary | ICD-10-CM | POA: Diagnosis not present

## 2016-04-09 DIAGNOSIS — I10 Essential (primary) hypertension: Secondary | ICD-10-CM | POA: Diagnosis not present

## 2016-04-10 DIAGNOSIS — F419 Anxiety disorder, unspecified: Secondary | ICD-10-CM | POA: Diagnosis not present

## 2016-04-10 DIAGNOSIS — I10 Essential (primary) hypertension: Secondary | ICD-10-CM | POA: Diagnosis not present

## 2016-04-10 DIAGNOSIS — M810 Age-related osteoporosis without current pathological fracture: Secondary | ICD-10-CM | POA: Diagnosis not present

## 2016-04-10 DIAGNOSIS — R412 Retrograde amnesia: Secondary | ICD-10-CM | POA: Diagnosis not present

## 2016-04-11 DIAGNOSIS — M810 Age-related osteoporosis without current pathological fracture: Secondary | ICD-10-CM | POA: Diagnosis not present

## 2016-04-11 DIAGNOSIS — I10 Essential (primary) hypertension: Secondary | ICD-10-CM | POA: Diagnosis not present

## 2016-04-11 DIAGNOSIS — F419 Anxiety disorder, unspecified: Secondary | ICD-10-CM | POA: Diagnosis not present

## 2016-04-11 DIAGNOSIS — R412 Retrograde amnesia: Secondary | ICD-10-CM | POA: Diagnosis not present

## 2016-04-12 DIAGNOSIS — I10 Essential (primary) hypertension: Secondary | ICD-10-CM | POA: Diagnosis not present

## 2016-04-12 DIAGNOSIS — M810 Age-related osteoporosis without current pathological fracture: Secondary | ICD-10-CM | POA: Diagnosis not present

## 2016-04-12 DIAGNOSIS — F419 Anxiety disorder, unspecified: Secondary | ICD-10-CM | POA: Diagnosis not present

## 2016-04-12 DIAGNOSIS — R412 Retrograde amnesia: Secondary | ICD-10-CM | POA: Diagnosis not present

## 2016-04-13 DIAGNOSIS — I10 Essential (primary) hypertension: Secondary | ICD-10-CM | POA: Diagnosis not present

## 2016-04-13 DIAGNOSIS — F419 Anxiety disorder, unspecified: Secondary | ICD-10-CM | POA: Diagnosis not present

## 2016-04-13 DIAGNOSIS — R412 Retrograde amnesia: Secondary | ICD-10-CM | POA: Diagnosis not present

## 2016-04-13 DIAGNOSIS — M810 Age-related osteoporosis without current pathological fracture: Secondary | ICD-10-CM | POA: Diagnosis not present

## 2016-04-14 DIAGNOSIS — F419 Anxiety disorder, unspecified: Secondary | ICD-10-CM | POA: Diagnosis not present

## 2016-04-14 DIAGNOSIS — M810 Age-related osteoporosis without current pathological fracture: Secondary | ICD-10-CM | POA: Diagnosis not present

## 2016-04-14 DIAGNOSIS — R412 Retrograde amnesia: Secondary | ICD-10-CM | POA: Diagnosis not present

## 2016-04-14 DIAGNOSIS — I10 Essential (primary) hypertension: Secondary | ICD-10-CM | POA: Diagnosis not present

## 2016-04-15 DIAGNOSIS — M810 Age-related osteoporosis without current pathological fracture: Secondary | ICD-10-CM | POA: Diagnosis not present

## 2016-04-15 DIAGNOSIS — R412 Retrograde amnesia: Secondary | ICD-10-CM | POA: Diagnosis not present

## 2016-04-15 DIAGNOSIS — F419 Anxiety disorder, unspecified: Secondary | ICD-10-CM | POA: Diagnosis not present

## 2016-04-15 DIAGNOSIS — I10 Essential (primary) hypertension: Secondary | ICD-10-CM | POA: Diagnosis not present

## 2016-04-16 DIAGNOSIS — R412 Retrograde amnesia: Secondary | ICD-10-CM | POA: Diagnosis not present

## 2016-04-16 DIAGNOSIS — F419 Anxiety disorder, unspecified: Secondary | ICD-10-CM | POA: Diagnosis not present

## 2016-04-16 DIAGNOSIS — M810 Age-related osteoporosis without current pathological fracture: Secondary | ICD-10-CM | POA: Diagnosis not present

## 2016-04-16 DIAGNOSIS — I10 Essential (primary) hypertension: Secondary | ICD-10-CM | POA: Diagnosis not present

## 2016-04-17 DIAGNOSIS — R412 Retrograde amnesia: Secondary | ICD-10-CM | POA: Diagnosis not present

## 2016-04-17 DIAGNOSIS — F419 Anxiety disorder, unspecified: Secondary | ICD-10-CM | POA: Diagnosis not present

## 2016-04-17 DIAGNOSIS — I10 Essential (primary) hypertension: Secondary | ICD-10-CM | POA: Diagnosis not present

## 2016-04-17 DIAGNOSIS — M810 Age-related osteoporosis without current pathological fracture: Secondary | ICD-10-CM | POA: Diagnosis not present

## 2016-04-18 DIAGNOSIS — I10 Essential (primary) hypertension: Secondary | ICD-10-CM | POA: Diagnosis not present

## 2016-04-18 DIAGNOSIS — R412 Retrograde amnesia: Secondary | ICD-10-CM | POA: Diagnosis not present

## 2016-04-18 DIAGNOSIS — M810 Age-related osteoporosis without current pathological fracture: Secondary | ICD-10-CM | POA: Diagnosis not present

## 2016-04-18 DIAGNOSIS — F419 Anxiety disorder, unspecified: Secondary | ICD-10-CM | POA: Diagnosis not present

## 2016-04-19 DIAGNOSIS — M810 Age-related osteoporosis without current pathological fracture: Secondary | ICD-10-CM | POA: Diagnosis not present

## 2016-04-19 DIAGNOSIS — F419 Anxiety disorder, unspecified: Secondary | ICD-10-CM | POA: Diagnosis not present

## 2016-04-19 DIAGNOSIS — R412 Retrograde amnesia: Secondary | ICD-10-CM | POA: Diagnosis not present

## 2016-04-19 DIAGNOSIS — I10 Essential (primary) hypertension: Secondary | ICD-10-CM | POA: Diagnosis not present

## 2016-04-20 DIAGNOSIS — F419 Anxiety disorder, unspecified: Secondary | ICD-10-CM | POA: Diagnosis not present

## 2016-04-20 DIAGNOSIS — I10 Essential (primary) hypertension: Secondary | ICD-10-CM | POA: Diagnosis not present

## 2016-04-20 DIAGNOSIS — M810 Age-related osteoporosis without current pathological fracture: Secondary | ICD-10-CM | POA: Diagnosis not present

## 2016-04-20 DIAGNOSIS — R412 Retrograde amnesia: Secondary | ICD-10-CM | POA: Diagnosis not present

## 2016-04-21 DIAGNOSIS — F419 Anxiety disorder, unspecified: Secondary | ICD-10-CM | POA: Diagnosis not present

## 2016-04-21 DIAGNOSIS — I10 Essential (primary) hypertension: Secondary | ICD-10-CM | POA: Diagnosis not present

## 2016-04-21 DIAGNOSIS — R412 Retrograde amnesia: Secondary | ICD-10-CM | POA: Diagnosis not present

## 2016-04-21 DIAGNOSIS — M810 Age-related osteoporosis without current pathological fracture: Secondary | ICD-10-CM | POA: Diagnosis not present

## 2016-04-22 DIAGNOSIS — M810 Age-related osteoporosis without current pathological fracture: Secondary | ICD-10-CM | POA: Diagnosis not present

## 2016-04-22 DIAGNOSIS — R412 Retrograde amnesia: Secondary | ICD-10-CM | POA: Diagnosis not present

## 2016-04-22 DIAGNOSIS — I10 Essential (primary) hypertension: Secondary | ICD-10-CM | POA: Diagnosis not present

## 2016-04-22 DIAGNOSIS — F419 Anxiety disorder, unspecified: Secondary | ICD-10-CM | POA: Diagnosis not present

## 2016-04-23 DIAGNOSIS — F419 Anxiety disorder, unspecified: Secondary | ICD-10-CM | POA: Diagnosis not present

## 2016-04-23 DIAGNOSIS — R412 Retrograde amnesia: Secondary | ICD-10-CM | POA: Diagnosis not present

## 2016-04-23 DIAGNOSIS — I10 Essential (primary) hypertension: Secondary | ICD-10-CM | POA: Diagnosis not present

## 2016-04-23 DIAGNOSIS — M810 Age-related osteoporosis without current pathological fracture: Secondary | ICD-10-CM | POA: Diagnosis not present

## 2016-04-24 DIAGNOSIS — F419 Anxiety disorder, unspecified: Secondary | ICD-10-CM | POA: Diagnosis not present

## 2016-04-24 DIAGNOSIS — R412 Retrograde amnesia: Secondary | ICD-10-CM | POA: Diagnosis not present

## 2016-04-24 DIAGNOSIS — I10 Essential (primary) hypertension: Secondary | ICD-10-CM | POA: Diagnosis not present

## 2016-04-24 DIAGNOSIS — M810 Age-related osteoporosis without current pathological fracture: Secondary | ICD-10-CM | POA: Diagnosis not present

## 2016-04-25 DIAGNOSIS — F419 Anxiety disorder, unspecified: Secondary | ICD-10-CM | POA: Diagnosis not present

## 2016-04-25 DIAGNOSIS — I10 Essential (primary) hypertension: Secondary | ICD-10-CM | POA: Diagnosis not present

## 2016-04-25 DIAGNOSIS — R412 Retrograde amnesia: Secondary | ICD-10-CM | POA: Diagnosis not present

## 2016-04-25 DIAGNOSIS — M810 Age-related osteoporosis without current pathological fracture: Secondary | ICD-10-CM | POA: Diagnosis not present

## 2016-04-26 DIAGNOSIS — I10 Essential (primary) hypertension: Secondary | ICD-10-CM | POA: Diagnosis not present

## 2016-04-26 DIAGNOSIS — F419 Anxiety disorder, unspecified: Secondary | ICD-10-CM | POA: Diagnosis not present

## 2016-04-26 DIAGNOSIS — M810 Age-related osteoporosis without current pathological fracture: Secondary | ICD-10-CM | POA: Diagnosis not present

## 2016-04-26 DIAGNOSIS — R412 Retrograde amnesia: Secondary | ICD-10-CM | POA: Diagnosis not present

## 2016-04-27 DIAGNOSIS — I10 Essential (primary) hypertension: Secondary | ICD-10-CM | POA: Diagnosis not present

## 2016-04-27 DIAGNOSIS — F419 Anxiety disorder, unspecified: Secondary | ICD-10-CM | POA: Diagnosis not present

## 2016-04-27 DIAGNOSIS — R412 Retrograde amnesia: Secondary | ICD-10-CM | POA: Diagnosis not present

## 2016-04-27 DIAGNOSIS — M810 Age-related osteoporosis without current pathological fracture: Secondary | ICD-10-CM | POA: Diagnosis not present

## 2016-04-28 DIAGNOSIS — I10 Essential (primary) hypertension: Secondary | ICD-10-CM | POA: Diagnosis not present

## 2016-04-28 DIAGNOSIS — M810 Age-related osteoporosis without current pathological fracture: Secondary | ICD-10-CM | POA: Diagnosis not present

## 2016-04-28 DIAGNOSIS — F419 Anxiety disorder, unspecified: Secondary | ICD-10-CM | POA: Diagnosis not present

## 2016-04-28 DIAGNOSIS — R412 Retrograde amnesia: Secondary | ICD-10-CM | POA: Diagnosis not present

## 2016-04-29 DIAGNOSIS — I10 Essential (primary) hypertension: Secondary | ICD-10-CM | POA: Diagnosis not present

## 2016-04-29 DIAGNOSIS — R412 Retrograde amnesia: Secondary | ICD-10-CM | POA: Diagnosis not present

## 2016-04-29 DIAGNOSIS — M810 Age-related osteoporosis without current pathological fracture: Secondary | ICD-10-CM | POA: Diagnosis not present

## 2016-04-29 DIAGNOSIS — F419 Anxiety disorder, unspecified: Secondary | ICD-10-CM | POA: Diagnosis not present

## 2016-04-30 DIAGNOSIS — M810 Age-related osteoporosis without current pathological fracture: Secondary | ICD-10-CM | POA: Diagnosis not present

## 2016-04-30 DIAGNOSIS — R412 Retrograde amnesia: Secondary | ICD-10-CM | POA: Diagnosis not present

## 2016-04-30 DIAGNOSIS — I10 Essential (primary) hypertension: Secondary | ICD-10-CM | POA: Diagnosis not present

## 2016-04-30 DIAGNOSIS — F419 Anxiety disorder, unspecified: Secondary | ICD-10-CM | POA: Diagnosis not present

## 2016-05-01 DIAGNOSIS — I10 Essential (primary) hypertension: Secondary | ICD-10-CM | POA: Diagnosis not present

## 2016-05-01 DIAGNOSIS — F419 Anxiety disorder, unspecified: Secondary | ICD-10-CM | POA: Diagnosis not present

## 2016-05-01 DIAGNOSIS — R412 Retrograde amnesia: Secondary | ICD-10-CM | POA: Diagnosis not present

## 2016-05-01 DIAGNOSIS — M810 Age-related osteoporosis without current pathological fracture: Secondary | ICD-10-CM | POA: Diagnosis not present

## 2016-05-03 DIAGNOSIS — R412 Retrograde amnesia: Secondary | ICD-10-CM | POA: Diagnosis not present

## 2016-05-03 DIAGNOSIS — I10 Essential (primary) hypertension: Secondary | ICD-10-CM | POA: Diagnosis not present

## 2016-05-03 DIAGNOSIS — F419 Anxiety disorder, unspecified: Secondary | ICD-10-CM | POA: Diagnosis not present

## 2016-05-03 DIAGNOSIS — M810 Age-related osteoporosis without current pathological fracture: Secondary | ICD-10-CM | POA: Diagnosis not present

## 2016-05-04 DIAGNOSIS — I10 Essential (primary) hypertension: Secondary | ICD-10-CM | POA: Diagnosis not present

## 2016-05-04 DIAGNOSIS — M810 Age-related osteoporosis without current pathological fracture: Secondary | ICD-10-CM | POA: Diagnosis not present

## 2016-05-04 DIAGNOSIS — R412 Retrograde amnesia: Secondary | ICD-10-CM | POA: Diagnosis not present

## 2016-05-04 DIAGNOSIS — F419 Anxiety disorder, unspecified: Secondary | ICD-10-CM | POA: Diagnosis not present

## 2016-05-05 DIAGNOSIS — M810 Age-related osteoporosis without current pathological fracture: Secondary | ICD-10-CM | POA: Diagnosis not present

## 2016-05-05 DIAGNOSIS — I10 Essential (primary) hypertension: Secondary | ICD-10-CM | POA: Diagnosis not present

## 2016-05-05 DIAGNOSIS — F419 Anxiety disorder, unspecified: Secondary | ICD-10-CM | POA: Diagnosis not present

## 2016-05-05 DIAGNOSIS — R412 Retrograde amnesia: Secondary | ICD-10-CM | POA: Diagnosis not present

## 2016-05-06 DIAGNOSIS — I10 Essential (primary) hypertension: Secondary | ICD-10-CM | POA: Diagnosis not present

## 2016-05-06 DIAGNOSIS — R412 Retrograde amnesia: Secondary | ICD-10-CM | POA: Diagnosis not present

## 2016-05-06 DIAGNOSIS — F419 Anxiety disorder, unspecified: Secondary | ICD-10-CM | POA: Diagnosis not present

## 2016-05-06 DIAGNOSIS — M810 Age-related osteoporosis without current pathological fracture: Secondary | ICD-10-CM | POA: Diagnosis not present

## 2016-05-07 DIAGNOSIS — R412 Retrograde amnesia: Secondary | ICD-10-CM | POA: Diagnosis not present

## 2016-05-07 DIAGNOSIS — M810 Age-related osteoporosis without current pathological fracture: Secondary | ICD-10-CM | POA: Diagnosis not present

## 2016-05-07 DIAGNOSIS — F419 Anxiety disorder, unspecified: Secondary | ICD-10-CM | POA: Diagnosis not present

## 2016-05-07 DIAGNOSIS — I10 Essential (primary) hypertension: Secondary | ICD-10-CM | POA: Diagnosis not present

## 2016-05-08 DIAGNOSIS — F419 Anxiety disorder, unspecified: Secondary | ICD-10-CM | POA: Diagnosis not present

## 2016-05-08 DIAGNOSIS — I10 Essential (primary) hypertension: Secondary | ICD-10-CM | POA: Diagnosis not present

## 2016-05-08 DIAGNOSIS — R412 Retrograde amnesia: Secondary | ICD-10-CM | POA: Diagnosis not present

## 2016-05-08 DIAGNOSIS — M810 Age-related osteoporosis without current pathological fracture: Secondary | ICD-10-CM | POA: Diagnosis not present

## 2016-05-09 DIAGNOSIS — F419 Anxiety disorder, unspecified: Secondary | ICD-10-CM | POA: Diagnosis not present

## 2016-05-09 DIAGNOSIS — I10 Essential (primary) hypertension: Secondary | ICD-10-CM | POA: Diagnosis not present

## 2016-05-09 DIAGNOSIS — R412 Retrograde amnesia: Secondary | ICD-10-CM | POA: Diagnosis not present

## 2016-05-09 DIAGNOSIS — M810 Age-related osteoporosis without current pathological fracture: Secondary | ICD-10-CM | POA: Diagnosis not present

## 2016-05-10 DIAGNOSIS — R412 Retrograde amnesia: Secondary | ICD-10-CM | POA: Diagnosis not present

## 2016-05-10 DIAGNOSIS — I10 Essential (primary) hypertension: Secondary | ICD-10-CM | POA: Diagnosis not present

## 2016-05-10 DIAGNOSIS — F419 Anxiety disorder, unspecified: Secondary | ICD-10-CM | POA: Diagnosis not present

## 2016-05-10 DIAGNOSIS — M810 Age-related osteoporosis without current pathological fracture: Secondary | ICD-10-CM | POA: Diagnosis not present

## 2016-05-11 DIAGNOSIS — I10 Essential (primary) hypertension: Secondary | ICD-10-CM | POA: Diagnosis not present

## 2016-05-11 DIAGNOSIS — M810 Age-related osteoporosis without current pathological fracture: Secondary | ICD-10-CM | POA: Diagnosis not present

## 2016-05-11 DIAGNOSIS — F419 Anxiety disorder, unspecified: Secondary | ICD-10-CM | POA: Diagnosis not present

## 2016-05-11 DIAGNOSIS — R412 Retrograde amnesia: Secondary | ICD-10-CM | POA: Diagnosis not present

## 2016-05-12 DIAGNOSIS — F419 Anxiety disorder, unspecified: Secondary | ICD-10-CM | POA: Diagnosis not present

## 2016-05-12 DIAGNOSIS — I10 Essential (primary) hypertension: Secondary | ICD-10-CM | POA: Diagnosis not present

## 2016-05-12 DIAGNOSIS — M810 Age-related osteoporosis without current pathological fracture: Secondary | ICD-10-CM | POA: Diagnosis not present

## 2016-05-12 DIAGNOSIS — R412 Retrograde amnesia: Secondary | ICD-10-CM | POA: Diagnosis not present

## 2016-05-13 DIAGNOSIS — R412 Retrograde amnesia: Secondary | ICD-10-CM | POA: Diagnosis not present

## 2016-05-13 DIAGNOSIS — I10 Essential (primary) hypertension: Secondary | ICD-10-CM | POA: Diagnosis not present

## 2016-05-13 DIAGNOSIS — F419 Anxiety disorder, unspecified: Secondary | ICD-10-CM | POA: Diagnosis not present

## 2016-05-13 DIAGNOSIS — M810 Age-related osteoporosis without current pathological fracture: Secondary | ICD-10-CM | POA: Diagnosis not present

## 2016-05-14 DIAGNOSIS — R412 Retrograde amnesia: Secondary | ICD-10-CM | POA: Diagnosis not present

## 2016-05-14 DIAGNOSIS — M810 Age-related osteoporosis without current pathological fracture: Secondary | ICD-10-CM | POA: Diagnosis not present

## 2016-05-14 DIAGNOSIS — F419 Anxiety disorder, unspecified: Secondary | ICD-10-CM | POA: Diagnosis not present

## 2016-05-14 DIAGNOSIS — I10 Essential (primary) hypertension: Secondary | ICD-10-CM | POA: Diagnosis not present

## 2016-05-15 DIAGNOSIS — M810 Age-related osteoporosis without current pathological fracture: Secondary | ICD-10-CM | POA: Diagnosis not present

## 2016-05-15 DIAGNOSIS — F419 Anxiety disorder, unspecified: Secondary | ICD-10-CM | POA: Diagnosis not present

## 2016-05-15 DIAGNOSIS — R412 Retrograde amnesia: Secondary | ICD-10-CM | POA: Diagnosis not present

## 2016-05-15 DIAGNOSIS — I10 Essential (primary) hypertension: Secondary | ICD-10-CM | POA: Diagnosis not present

## 2016-05-16 DIAGNOSIS — I10 Essential (primary) hypertension: Secondary | ICD-10-CM | POA: Diagnosis not present

## 2016-05-16 DIAGNOSIS — R412 Retrograde amnesia: Secondary | ICD-10-CM | POA: Diagnosis not present

## 2016-05-16 DIAGNOSIS — M810 Age-related osteoporosis without current pathological fracture: Secondary | ICD-10-CM | POA: Diagnosis not present

## 2016-05-16 DIAGNOSIS — F419 Anxiety disorder, unspecified: Secondary | ICD-10-CM | POA: Diagnosis not present

## 2016-05-17 DIAGNOSIS — R412 Retrograde amnesia: Secondary | ICD-10-CM | POA: Diagnosis not present

## 2016-05-17 DIAGNOSIS — I10 Essential (primary) hypertension: Secondary | ICD-10-CM | POA: Diagnosis not present

## 2016-05-17 DIAGNOSIS — M810 Age-related osteoporosis without current pathological fracture: Secondary | ICD-10-CM | POA: Diagnosis not present

## 2016-05-17 DIAGNOSIS — F419 Anxiety disorder, unspecified: Secondary | ICD-10-CM | POA: Diagnosis not present

## 2016-05-18 DIAGNOSIS — I10 Essential (primary) hypertension: Secondary | ICD-10-CM | POA: Diagnosis not present

## 2016-05-18 DIAGNOSIS — R412 Retrograde amnesia: Secondary | ICD-10-CM | POA: Diagnosis not present

## 2016-05-18 DIAGNOSIS — F419 Anxiety disorder, unspecified: Secondary | ICD-10-CM | POA: Diagnosis not present

## 2016-05-18 DIAGNOSIS — M810 Age-related osteoporosis without current pathological fracture: Secondary | ICD-10-CM | POA: Diagnosis not present

## 2016-05-19 DIAGNOSIS — I10 Essential (primary) hypertension: Secondary | ICD-10-CM | POA: Diagnosis not present

## 2016-05-19 DIAGNOSIS — F419 Anxiety disorder, unspecified: Secondary | ICD-10-CM | POA: Diagnosis not present

## 2016-05-19 DIAGNOSIS — R412 Retrograde amnesia: Secondary | ICD-10-CM | POA: Diagnosis not present

## 2016-05-19 DIAGNOSIS — M810 Age-related osteoporosis without current pathological fracture: Secondary | ICD-10-CM | POA: Diagnosis not present

## 2016-05-20 DIAGNOSIS — M810 Age-related osteoporosis without current pathological fracture: Secondary | ICD-10-CM | POA: Diagnosis not present

## 2016-05-20 DIAGNOSIS — I10 Essential (primary) hypertension: Secondary | ICD-10-CM | POA: Diagnosis not present

## 2016-05-20 DIAGNOSIS — R412 Retrograde amnesia: Secondary | ICD-10-CM | POA: Diagnosis not present

## 2016-05-20 DIAGNOSIS — F419 Anxiety disorder, unspecified: Secondary | ICD-10-CM | POA: Diagnosis not present

## 2016-05-21 DIAGNOSIS — F419 Anxiety disorder, unspecified: Secondary | ICD-10-CM | POA: Diagnosis not present

## 2016-05-21 DIAGNOSIS — M810 Age-related osteoporosis without current pathological fracture: Secondary | ICD-10-CM | POA: Diagnosis not present

## 2016-05-21 DIAGNOSIS — R412 Retrograde amnesia: Secondary | ICD-10-CM | POA: Diagnosis not present

## 2016-05-21 DIAGNOSIS — I10 Essential (primary) hypertension: Secondary | ICD-10-CM | POA: Diagnosis not present

## 2016-05-22 DIAGNOSIS — M810 Age-related osteoporosis without current pathological fracture: Secondary | ICD-10-CM | POA: Diagnosis not present

## 2016-05-22 DIAGNOSIS — I10 Essential (primary) hypertension: Secondary | ICD-10-CM | POA: Diagnosis not present

## 2016-05-22 DIAGNOSIS — F419 Anxiety disorder, unspecified: Secondary | ICD-10-CM | POA: Diagnosis not present

## 2016-05-22 DIAGNOSIS — R412 Retrograde amnesia: Secondary | ICD-10-CM | POA: Diagnosis not present

## 2016-05-23 DIAGNOSIS — R412 Retrograde amnesia: Secondary | ICD-10-CM | POA: Diagnosis not present

## 2016-05-23 DIAGNOSIS — I10 Essential (primary) hypertension: Secondary | ICD-10-CM | POA: Diagnosis not present

## 2016-05-23 DIAGNOSIS — F419 Anxiety disorder, unspecified: Secondary | ICD-10-CM | POA: Diagnosis not present

## 2016-05-23 DIAGNOSIS — M810 Age-related osteoporosis without current pathological fracture: Secondary | ICD-10-CM | POA: Diagnosis not present

## 2016-05-24 DIAGNOSIS — F419 Anxiety disorder, unspecified: Secondary | ICD-10-CM | POA: Diagnosis not present

## 2016-05-24 DIAGNOSIS — R412 Retrograde amnesia: Secondary | ICD-10-CM | POA: Diagnosis not present

## 2016-05-24 DIAGNOSIS — M810 Age-related osteoporosis without current pathological fracture: Secondary | ICD-10-CM | POA: Diagnosis not present

## 2016-05-24 DIAGNOSIS — I10 Essential (primary) hypertension: Secondary | ICD-10-CM | POA: Diagnosis not present

## 2016-05-25 DIAGNOSIS — F419 Anxiety disorder, unspecified: Secondary | ICD-10-CM | POA: Diagnosis not present

## 2016-05-25 DIAGNOSIS — R412 Retrograde amnesia: Secondary | ICD-10-CM | POA: Diagnosis not present

## 2016-05-25 DIAGNOSIS — I10 Essential (primary) hypertension: Secondary | ICD-10-CM | POA: Diagnosis not present

## 2016-05-25 DIAGNOSIS — M810 Age-related osteoporosis without current pathological fracture: Secondary | ICD-10-CM | POA: Diagnosis not present

## 2016-05-26 DIAGNOSIS — M810 Age-related osteoporosis without current pathological fracture: Secondary | ICD-10-CM | POA: Diagnosis not present

## 2016-05-26 DIAGNOSIS — I10 Essential (primary) hypertension: Secondary | ICD-10-CM | POA: Diagnosis not present

## 2016-05-26 DIAGNOSIS — R412 Retrograde amnesia: Secondary | ICD-10-CM | POA: Diagnosis not present

## 2016-05-26 DIAGNOSIS — F419 Anxiety disorder, unspecified: Secondary | ICD-10-CM | POA: Diagnosis not present

## 2016-05-27 DIAGNOSIS — R412 Retrograde amnesia: Secondary | ICD-10-CM | POA: Diagnosis not present

## 2016-05-27 DIAGNOSIS — M810 Age-related osteoporosis without current pathological fracture: Secondary | ICD-10-CM | POA: Diagnosis not present

## 2016-05-27 DIAGNOSIS — I10 Essential (primary) hypertension: Secondary | ICD-10-CM | POA: Diagnosis not present

## 2016-05-27 DIAGNOSIS — F419 Anxiety disorder, unspecified: Secondary | ICD-10-CM | POA: Diagnosis not present

## 2016-05-28 DIAGNOSIS — F419 Anxiety disorder, unspecified: Secondary | ICD-10-CM | POA: Diagnosis not present

## 2016-05-28 DIAGNOSIS — M810 Age-related osteoporosis without current pathological fracture: Secondary | ICD-10-CM | POA: Diagnosis not present

## 2016-05-28 DIAGNOSIS — R412 Retrograde amnesia: Secondary | ICD-10-CM | POA: Diagnosis not present

## 2016-05-28 DIAGNOSIS — I10 Essential (primary) hypertension: Secondary | ICD-10-CM | POA: Diagnosis not present

## 2016-05-29 DIAGNOSIS — I10 Essential (primary) hypertension: Secondary | ICD-10-CM | POA: Diagnosis not present

## 2016-05-29 DIAGNOSIS — M810 Age-related osteoporosis without current pathological fracture: Secondary | ICD-10-CM | POA: Diagnosis not present

## 2016-05-29 DIAGNOSIS — F419 Anxiety disorder, unspecified: Secondary | ICD-10-CM | POA: Diagnosis not present

## 2016-05-29 DIAGNOSIS — R412 Retrograde amnesia: Secondary | ICD-10-CM | POA: Diagnosis not present

## 2016-05-30 DIAGNOSIS — Z6824 Body mass index (BMI) 24.0-24.9, adult: Secondary | ICD-10-CM | POA: Diagnosis not present

## 2016-05-30 DIAGNOSIS — R412 Retrograde amnesia: Secondary | ICD-10-CM | POA: Diagnosis not present

## 2016-05-30 DIAGNOSIS — K219 Gastro-esophageal reflux disease without esophagitis: Secondary | ICD-10-CM | POA: Diagnosis not present

## 2016-05-30 DIAGNOSIS — M810 Age-related osteoporosis without current pathological fracture: Secondary | ICD-10-CM | POA: Diagnosis not present

## 2016-05-30 DIAGNOSIS — F419 Anxiety disorder, unspecified: Secondary | ICD-10-CM | POA: Diagnosis not present

## 2016-05-30 DIAGNOSIS — I10 Essential (primary) hypertension: Secondary | ICD-10-CM | POA: Diagnosis not present

## 2016-05-31 DIAGNOSIS — I10 Essential (primary) hypertension: Secondary | ICD-10-CM | POA: Diagnosis not present

## 2016-05-31 DIAGNOSIS — M810 Age-related osteoporosis without current pathological fracture: Secondary | ICD-10-CM | POA: Diagnosis not present

## 2016-05-31 DIAGNOSIS — F419 Anxiety disorder, unspecified: Secondary | ICD-10-CM | POA: Diagnosis not present

## 2016-05-31 DIAGNOSIS — R412 Retrograde amnesia: Secondary | ICD-10-CM | POA: Diagnosis not present

## 2016-06-01 DIAGNOSIS — F419 Anxiety disorder, unspecified: Secondary | ICD-10-CM | POA: Diagnosis not present

## 2016-06-01 DIAGNOSIS — M810 Age-related osteoporosis without current pathological fracture: Secondary | ICD-10-CM | POA: Diagnosis not present

## 2016-06-01 DIAGNOSIS — R412 Retrograde amnesia: Secondary | ICD-10-CM | POA: Diagnosis not present

## 2016-06-01 DIAGNOSIS — I10 Essential (primary) hypertension: Secondary | ICD-10-CM | POA: Diagnosis not present

## 2016-06-02 DIAGNOSIS — I10 Essential (primary) hypertension: Secondary | ICD-10-CM | POA: Diagnosis not present

## 2016-06-02 DIAGNOSIS — F419 Anxiety disorder, unspecified: Secondary | ICD-10-CM | POA: Diagnosis not present

## 2016-06-02 DIAGNOSIS — R412 Retrograde amnesia: Secondary | ICD-10-CM | POA: Diagnosis not present

## 2016-06-02 DIAGNOSIS — M810 Age-related osteoporosis without current pathological fracture: Secondary | ICD-10-CM | POA: Diagnosis not present

## 2016-06-03 DIAGNOSIS — F419 Anxiety disorder, unspecified: Secondary | ICD-10-CM | POA: Diagnosis not present

## 2016-06-03 DIAGNOSIS — I10 Essential (primary) hypertension: Secondary | ICD-10-CM | POA: Diagnosis not present

## 2016-06-03 DIAGNOSIS — M810 Age-related osteoporosis without current pathological fracture: Secondary | ICD-10-CM | POA: Diagnosis not present

## 2016-06-03 DIAGNOSIS — R412 Retrograde amnesia: Secondary | ICD-10-CM | POA: Diagnosis not present

## 2016-06-04 DIAGNOSIS — I10 Essential (primary) hypertension: Secondary | ICD-10-CM | POA: Diagnosis not present

## 2016-06-04 DIAGNOSIS — F419 Anxiety disorder, unspecified: Secondary | ICD-10-CM | POA: Diagnosis not present

## 2016-06-04 DIAGNOSIS — M810 Age-related osteoporosis without current pathological fracture: Secondary | ICD-10-CM | POA: Diagnosis not present

## 2016-06-04 DIAGNOSIS — R412 Retrograde amnesia: Secondary | ICD-10-CM | POA: Diagnosis not present

## 2016-06-05 DIAGNOSIS — F419 Anxiety disorder, unspecified: Secondary | ICD-10-CM | POA: Diagnosis not present

## 2016-06-05 DIAGNOSIS — M810 Age-related osteoporosis without current pathological fracture: Secondary | ICD-10-CM | POA: Diagnosis not present

## 2016-06-05 DIAGNOSIS — I10 Essential (primary) hypertension: Secondary | ICD-10-CM | POA: Diagnosis not present

## 2016-06-05 DIAGNOSIS — R412 Retrograde amnesia: Secondary | ICD-10-CM | POA: Diagnosis not present

## 2016-06-06 DIAGNOSIS — R412 Retrograde amnesia: Secondary | ICD-10-CM | POA: Diagnosis not present

## 2016-06-06 DIAGNOSIS — F419 Anxiety disorder, unspecified: Secondary | ICD-10-CM | POA: Diagnosis not present

## 2016-06-06 DIAGNOSIS — M810 Age-related osteoporosis without current pathological fracture: Secondary | ICD-10-CM | POA: Diagnosis not present

## 2016-06-06 DIAGNOSIS — I10 Essential (primary) hypertension: Secondary | ICD-10-CM | POA: Diagnosis not present

## 2016-06-07 DIAGNOSIS — I10 Essential (primary) hypertension: Secondary | ICD-10-CM | POA: Diagnosis not present

## 2016-06-07 DIAGNOSIS — M810 Age-related osteoporosis without current pathological fracture: Secondary | ICD-10-CM | POA: Diagnosis not present

## 2016-06-07 DIAGNOSIS — F419 Anxiety disorder, unspecified: Secondary | ICD-10-CM | POA: Diagnosis not present

## 2016-06-07 DIAGNOSIS — R412 Retrograde amnesia: Secondary | ICD-10-CM | POA: Diagnosis not present

## 2016-06-08 DIAGNOSIS — F419 Anxiety disorder, unspecified: Secondary | ICD-10-CM | POA: Diagnosis not present

## 2016-06-08 DIAGNOSIS — R412 Retrograde amnesia: Secondary | ICD-10-CM | POA: Diagnosis not present

## 2016-06-08 DIAGNOSIS — M810 Age-related osteoporosis without current pathological fracture: Secondary | ICD-10-CM | POA: Diagnosis not present

## 2016-06-08 DIAGNOSIS — I10 Essential (primary) hypertension: Secondary | ICD-10-CM | POA: Diagnosis not present

## 2016-06-09 DIAGNOSIS — I10 Essential (primary) hypertension: Secondary | ICD-10-CM | POA: Diagnosis not present

## 2016-06-09 DIAGNOSIS — F419 Anxiety disorder, unspecified: Secondary | ICD-10-CM | POA: Diagnosis not present

## 2016-06-09 DIAGNOSIS — R412 Retrograde amnesia: Secondary | ICD-10-CM | POA: Diagnosis not present

## 2016-06-09 DIAGNOSIS — M810 Age-related osteoporosis without current pathological fracture: Secondary | ICD-10-CM | POA: Diagnosis not present

## 2016-06-10 DIAGNOSIS — R412 Retrograde amnesia: Secondary | ICD-10-CM | POA: Diagnosis not present

## 2016-06-10 DIAGNOSIS — M810 Age-related osteoporosis without current pathological fracture: Secondary | ICD-10-CM | POA: Diagnosis not present

## 2016-06-10 DIAGNOSIS — F419 Anxiety disorder, unspecified: Secondary | ICD-10-CM | POA: Diagnosis not present

## 2016-06-10 DIAGNOSIS — I10 Essential (primary) hypertension: Secondary | ICD-10-CM | POA: Diagnosis not present

## 2016-06-11 DIAGNOSIS — R412 Retrograde amnesia: Secondary | ICD-10-CM | POA: Diagnosis not present

## 2016-06-11 DIAGNOSIS — I10 Essential (primary) hypertension: Secondary | ICD-10-CM | POA: Diagnosis not present

## 2016-06-11 DIAGNOSIS — F419 Anxiety disorder, unspecified: Secondary | ICD-10-CM | POA: Diagnosis not present

## 2016-06-11 DIAGNOSIS — M810 Age-related osteoporosis without current pathological fracture: Secondary | ICD-10-CM | POA: Diagnosis not present

## 2016-06-12 DIAGNOSIS — F419 Anxiety disorder, unspecified: Secondary | ICD-10-CM | POA: Diagnosis not present

## 2016-06-12 DIAGNOSIS — R412 Retrograde amnesia: Secondary | ICD-10-CM | POA: Diagnosis not present

## 2016-06-12 DIAGNOSIS — I10 Essential (primary) hypertension: Secondary | ICD-10-CM | POA: Diagnosis not present

## 2016-06-12 DIAGNOSIS — M810 Age-related osteoporosis without current pathological fracture: Secondary | ICD-10-CM | POA: Diagnosis not present

## 2016-06-13 DIAGNOSIS — M810 Age-related osteoporosis without current pathological fracture: Secondary | ICD-10-CM | POA: Diagnosis not present

## 2016-06-13 DIAGNOSIS — F419 Anxiety disorder, unspecified: Secondary | ICD-10-CM | POA: Diagnosis not present

## 2016-06-13 DIAGNOSIS — R412 Retrograde amnesia: Secondary | ICD-10-CM | POA: Diagnosis not present

## 2016-06-13 DIAGNOSIS — I10 Essential (primary) hypertension: Secondary | ICD-10-CM | POA: Diagnosis not present

## 2016-06-14 DIAGNOSIS — R412 Retrograde amnesia: Secondary | ICD-10-CM | POA: Diagnosis not present

## 2016-06-14 DIAGNOSIS — F419 Anxiety disorder, unspecified: Secondary | ICD-10-CM | POA: Diagnosis not present

## 2016-06-14 DIAGNOSIS — M810 Age-related osteoporosis without current pathological fracture: Secondary | ICD-10-CM | POA: Diagnosis not present

## 2016-06-14 DIAGNOSIS — I10 Essential (primary) hypertension: Secondary | ICD-10-CM | POA: Diagnosis not present

## 2016-06-15 DIAGNOSIS — F419 Anxiety disorder, unspecified: Secondary | ICD-10-CM | POA: Diagnosis not present

## 2016-06-15 DIAGNOSIS — I10 Essential (primary) hypertension: Secondary | ICD-10-CM | POA: Diagnosis not present

## 2016-06-15 DIAGNOSIS — M810 Age-related osteoporosis without current pathological fracture: Secondary | ICD-10-CM | POA: Diagnosis not present

## 2016-06-15 DIAGNOSIS — R412 Retrograde amnesia: Secondary | ICD-10-CM | POA: Diagnosis not present

## 2016-06-16 DIAGNOSIS — M810 Age-related osteoporosis without current pathological fracture: Secondary | ICD-10-CM | POA: Diagnosis not present

## 2016-06-16 DIAGNOSIS — I10 Essential (primary) hypertension: Secondary | ICD-10-CM | POA: Diagnosis not present

## 2016-06-16 DIAGNOSIS — F419 Anxiety disorder, unspecified: Secondary | ICD-10-CM | POA: Diagnosis not present

## 2016-06-16 DIAGNOSIS — R412 Retrograde amnesia: Secondary | ICD-10-CM | POA: Diagnosis not present

## 2016-06-17 DIAGNOSIS — M810 Age-related osteoporosis without current pathological fracture: Secondary | ICD-10-CM | POA: Diagnosis not present

## 2016-06-17 DIAGNOSIS — R412 Retrograde amnesia: Secondary | ICD-10-CM | POA: Diagnosis not present

## 2016-06-17 DIAGNOSIS — I10 Essential (primary) hypertension: Secondary | ICD-10-CM | POA: Diagnosis not present

## 2016-06-17 DIAGNOSIS — F419 Anxiety disorder, unspecified: Secondary | ICD-10-CM | POA: Diagnosis not present

## 2016-06-18 DIAGNOSIS — M810 Age-related osteoporosis without current pathological fracture: Secondary | ICD-10-CM | POA: Diagnosis not present

## 2016-06-18 DIAGNOSIS — I10 Essential (primary) hypertension: Secondary | ICD-10-CM | POA: Diagnosis not present

## 2016-06-18 DIAGNOSIS — F419 Anxiety disorder, unspecified: Secondary | ICD-10-CM | POA: Diagnosis not present

## 2016-06-18 DIAGNOSIS — R412 Retrograde amnesia: Secondary | ICD-10-CM | POA: Diagnosis not present

## 2016-06-19 DIAGNOSIS — I10 Essential (primary) hypertension: Secondary | ICD-10-CM | POA: Diagnosis not present

## 2016-06-19 DIAGNOSIS — F419 Anxiety disorder, unspecified: Secondary | ICD-10-CM | POA: Diagnosis not present

## 2016-06-19 DIAGNOSIS — R412 Retrograde amnesia: Secondary | ICD-10-CM | POA: Diagnosis not present

## 2016-06-19 DIAGNOSIS — M810 Age-related osteoporosis without current pathological fracture: Secondary | ICD-10-CM | POA: Diagnosis not present

## 2016-06-20 DIAGNOSIS — M810 Age-related osteoporosis without current pathological fracture: Secondary | ICD-10-CM | POA: Diagnosis not present

## 2016-06-20 DIAGNOSIS — R412 Retrograde amnesia: Secondary | ICD-10-CM | POA: Diagnosis not present

## 2016-06-20 DIAGNOSIS — I10 Essential (primary) hypertension: Secondary | ICD-10-CM | POA: Diagnosis not present

## 2016-06-20 DIAGNOSIS — F419 Anxiety disorder, unspecified: Secondary | ICD-10-CM | POA: Diagnosis not present

## 2016-06-21 DIAGNOSIS — F419 Anxiety disorder, unspecified: Secondary | ICD-10-CM | POA: Diagnosis not present

## 2016-06-21 DIAGNOSIS — I10 Essential (primary) hypertension: Secondary | ICD-10-CM | POA: Diagnosis not present

## 2016-06-21 DIAGNOSIS — M810 Age-related osteoporosis without current pathological fracture: Secondary | ICD-10-CM | POA: Diagnosis not present

## 2016-06-21 DIAGNOSIS — R412 Retrograde amnesia: Secondary | ICD-10-CM | POA: Diagnosis not present

## 2016-06-22 DIAGNOSIS — R412 Retrograde amnesia: Secondary | ICD-10-CM | POA: Diagnosis not present

## 2016-06-22 DIAGNOSIS — F419 Anxiety disorder, unspecified: Secondary | ICD-10-CM | POA: Diagnosis not present

## 2016-06-22 DIAGNOSIS — M810 Age-related osteoporosis without current pathological fracture: Secondary | ICD-10-CM | POA: Diagnosis not present

## 2016-06-22 DIAGNOSIS — I10 Essential (primary) hypertension: Secondary | ICD-10-CM | POA: Diagnosis not present

## 2016-06-23 DIAGNOSIS — R412 Retrograde amnesia: Secondary | ICD-10-CM | POA: Diagnosis not present

## 2016-06-23 DIAGNOSIS — M810 Age-related osteoporosis without current pathological fracture: Secondary | ICD-10-CM | POA: Diagnosis not present

## 2016-06-23 DIAGNOSIS — I10 Essential (primary) hypertension: Secondary | ICD-10-CM | POA: Diagnosis not present

## 2016-06-23 DIAGNOSIS — F419 Anxiety disorder, unspecified: Secondary | ICD-10-CM | POA: Diagnosis not present

## 2016-06-24 DIAGNOSIS — F419 Anxiety disorder, unspecified: Secondary | ICD-10-CM | POA: Diagnosis not present

## 2016-06-24 DIAGNOSIS — I10 Essential (primary) hypertension: Secondary | ICD-10-CM | POA: Diagnosis not present

## 2016-06-24 DIAGNOSIS — R412 Retrograde amnesia: Secondary | ICD-10-CM | POA: Diagnosis not present

## 2016-06-24 DIAGNOSIS — M810 Age-related osteoporosis without current pathological fracture: Secondary | ICD-10-CM | POA: Diagnosis not present

## 2016-06-25 DIAGNOSIS — I10 Essential (primary) hypertension: Secondary | ICD-10-CM | POA: Diagnosis not present

## 2016-06-25 DIAGNOSIS — R412 Retrograde amnesia: Secondary | ICD-10-CM | POA: Diagnosis not present

## 2016-06-25 DIAGNOSIS — M810 Age-related osteoporosis without current pathological fracture: Secondary | ICD-10-CM | POA: Diagnosis not present

## 2016-06-25 DIAGNOSIS — F419 Anxiety disorder, unspecified: Secondary | ICD-10-CM | POA: Diagnosis not present

## 2016-06-26 DIAGNOSIS — R412 Retrograde amnesia: Secondary | ICD-10-CM | POA: Diagnosis not present

## 2016-06-26 DIAGNOSIS — F419 Anxiety disorder, unspecified: Secondary | ICD-10-CM | POA: Diagnosis not present

## 2016-06-26 DIAGNOSIS — M810 Age-related osteoporosis without current pathological fracture: Secondary | ICD-10-CM | POA: Diagnosis not present

## 2016-06-26 DIAGNOSIS — I10 Essential (primary) hypertension: Secondary | ICD-10-CM | POA: Diagnosis not present

## 2016-06-27 DIAGNOSIS — F419 Anxiety disorder, unspecified: Secondary | ICD-10-CM | POA: Diagnosis not present

## 2016-06-27 DIAGNOSIS — I10 Essential (primary) hypertension: Secondary | ICD-10-CM | POA: Diagnosis not present

## 2016-06-27 DIAGNOSIS — R412 Retrograde amnesia: Secondary | ICD-10-CM | POA: Diagnosis not present

## 2016-06-27 DIAGNOSIS — M810 Age-related osteoporosis without current pathological fracture: Secondary | ICD-10-CM | POA: Diagnosis not present

## 2016-06-28 DIAGNOSIS — I10 Essential (primary) hypertension: Secondary | ICD-10-CM | POA: Diagnosis not present

## 2016-06-28 DIAGNOSIS — M810 Age-related osteoporosis without current pathological fracture: Secondary | ICD-10-CM | POA: Diagnosis not present

## 2016-06-28 DIAGNOSIS — F419 Anxiety disorder, unspecified: Secondary | ICD-10-CM | POA: Diagnosis not present

## 2016-06-28 DIAGNOSIS — R412 Retrograde amnesia: Secondary | ICD-10-CM | POA: Diagnosis not present

## 2016-06-29 DIAGNOSIS — M810 Age-related osteoporosis without current pathological fracture: Secondary | ICD-10-CM | POA: Diagnosis not present

## 2016-06-29 DIAGNOSIS — F419 Anxiety disorder, unspecified: Secondary | ICD-10-CM | POA: Diagnosis not present

## 2016-06-29 DIAGNOSIS — R412 Retrograde amnesia: Secondary | ICD-10-CM | POA: Diagnosis not present

## 2016-06-29 DIAGNOSIS — I10 Essential (primary) hypertension: Secondary | ICD-10-CM | POA: Diagnosis not present

## 2016-06-30 DIAGNOSIS — I10 Essential (primary) hypertension: Secondary | ICD-10-CM | POA: Diagnosis not present

## 2016-06-30 DIAGNOSIS — R412 Retrograde amnesia: Secondary | ICD-10-CM | POA: Diagnosis not present

## 2016-06-30 DIAGNOSIS — M810 Age-related osteoporosis without current pathological fracture: Secondary | ICD-10-CM | POA: Diagnosis not present

## 2016-06-30 DIAGNOSIS — F419 Anxiety disorder, unspecified: Secondary | ICD-10-CM | POA: Diagnosis not present

## 2016-07-01 DIAGNOSIS — F419 Anxiety disorder, unspecified: Secondary | ICD-10-CM | POA: Diagnosis not present

## 2016-07-01 DIAGNOSIS — I10 Essential (primary) hypertension: Secondary | ICD-10-CM | POA: Diagnosis not present

## 2016-07-01 DIAGNOSIS — M810 Age-related osteoporosis without current pathological fracture: Secondary | ICD-10-CM | POA: Diagnosis not present

## 2016-07-01 DIAGNOSIS — R412 Retrograde amnesia: Secondary | ICD-10-CM | POA: Diagnosis not present

## 2016-07-02 DIAGNOSIS — R412 Retrograde amnesia: Secondary | ICD-10-CM | POA: Diagnosis not present

## 2016-07-02 DIAGNOSIS — F419 Anxiety disorder, unspecified: Secondary | ICD-10-CM | POA: Diagnosis not present

## 2016-07-02 DIAGNOSIS — I10 Essential (primary) hypertension: Secondary | ICD-10-CM | POA: Diagnosis not present

## 2016-07-02 DIAGNOSIS — M810 Age-related osteoporosis without current pathological fracture: Secondary | ICD-10-CM | POA: Diagnosis not present

## 2016-07-03 DIAGNOSIS — F419 Anxiety disorder, unspecified: Secondary | ICD-10-CM | POA: Diagnosis not present

## 2016-07-03 DIAGNOSIS — M810 Age-related osteoporosis without current pathological fracture: Secondary | ICD-10-CM | POA: Diagnosis not present

## 2016-07-03 DIAGNOSIS — I10 Essential (primary) hypertension: Secondary | ICD-10-CM | POA: Diagnosis not present

## 2016-07-03 DIAGNOSIS — R412 Retrograde amnesia: Secondary | ICD-10-CM | POA: Diagnosis not present

## 2016-07-04 DIAGNOSIS — M810 Age-related osteoporosis without current pathological fracture: Secondary | ICD-10-CM | POA: Diagnosis not present

## 2016-07-04 DIAGNOSIS — I10 Essential (primary) hypertension: Secondary | ICD-10-CM | POA: Diagnosis not present

## 2016-07-04 DIAGNOSIS — F419 Anxiety disorder, unspecified: Secondary | ICD-10-CM | POA: Diagnosis not present

## 2016-07-04 DIAGNOSIS — R412 Retrograde amnesia: Secondary | ICD-10-CM | POA: Diagnosis not present

## 2016-07-05 DIAGNOSIS — F419 Anxiety disorder, unspecified: Secondary | ICD-10-CM | POA: Diagnosis not present

## 2016-07-05 DIAGNOSIS — M810 Age-related osteoporosis without current pathological fracture: Secondary | ICD-10-CM | POA: Diagnosis not present

## 2016-07-05 DIAGNOSIS — I10 Essential (primary) hypertension: Secondary | ICD-10-CM | POA: Diagnosis not present

## 2016-07-05 DIAGNOSIS — R412 Retrograde amnesia: Secondary | ICD-10-CM | POA: Diagnosis not present

## 2016-07-06 DIAGNOSIS — I10 Essential (primary) hypertension: Secondary | ICD-10-CM | POA: Diagnosis not present

## 2016-07-06 DIAGNOSIS — F419 Anxiety disorder, unspecified: Secondary | ICD-10-CM | POA: Diagnosis not present

## 2016-07-06 DIAGNOSIS — M810 Age-related osteoporosis without current pathological fracture: Secondary | ICD-10-CM | POA: Diagnosis not present

## 2016-07-06 DIAGNOSIS — R412 Retrograde amnesia: Secondary | ICD-10-CM | POA: Diagnosis not present

## 2016-07-07 DIAGNOSIS — M810 Age-related osteoporosis without current pathological fracture: Secondary | ICD-10-CM | POA: Diagnosis not present

## 2016-07-07 DIAGNOSIS — R412 Retrograde amnesia: Secondary | ICD-10-CM | POA: Diagnosis not present

## 2016-07-07 DIAGNOSIS — I10 Essential (primary) hypertension: Secondary | ICD-10-CM | POA: Diagnosis not present

## 2016-07-07 DIAGNOSIS — F419 Anxiety disorder, unspecified: Secondary | ICD-10-CM | POA: Diagnosis not present

## 2016-07-08 DIAGNOSIS — I10 Essential (primary) hypertension: Secondary | ICD-10-CM | POA: Diagnosis not present

## 2016-07-08 DIAGNOSIS — R412 Retrograde amnesia: Secondary | ICD-10-CM | POA: Diagnosis not present

## 2016-07-08 DIAGNOSIS — F419 Anxiety disorder, unspecified: Secondary | ICD-10-CM | POA: Diagnosis not present

## 2016-07-08 DIAGNOSIS — M810 Age-related osteoporosis without current pathological fracture: Secondary | ICD-10-CM | POA: Diagnosis not present

## 2016-07-09 DIAGNOSIS — F419 Anxiety disorder, unspecified: Secondary | ICD-10-CM | POA: Diagnosis not present

## 2016-07-09 DIAGNOSIS — M810 Age-related osteoporosis without current pathological fracture: Secondary | ICD-10-CM | POA: Diagnosis not present

## 2016-07-09 DIAGNOSIS — I10 Essential (primary) hypertension: Secondary | ICD-10-CM | POA: Diagnosis not present

## 2016-07-09 DIAGNOSIS — R412 Retrograde amnesia: Secondary | ICD-10-CM | POA: Diagnosis not present

## 2016-07-10 DIAGNOSIS — R412 Retrograde amnesia: Secondary | ICD-10-CM | POA: Diagnosis not present

## 2016-07-10 DIAGNOSIS — I10 Essential (primary) hypertension: Secondary | ICD-10-CM | POA: Diagnosis not present

## 2016-07-10 DIAGNOSIS — M810 Age-related osteoporosis without current pathological fracture: Secondary | ICD-10-CM | POA: Diagnosis not present

## 2016-07-10 DIAGNOSIS — F419 Anxiety disorder, unspecified: Secondary | ICD-10-CM | POA: Diagnosis not present

## 2016-07-11 DIAGNOSIS — I10 Essential (primary) hypertension: Secondary | ICD-10-CM | POA: Diagnosis not present

## 2016-07-11 DIAGNOSIS — M810 Age-related osteoporosis without current pathological fracture: Secondary | ICD-10-CM | POA: Diagnosis not present

## 2016-07-11 DIAGNOSIS — F419 Anxiety disorder, unspecified: Secondary | ICD-10-CM | POA: Diagnosis not present

## 2016-07-11 DIAGNOSIS — R412 Retrograde amnesia: Secondary | ICD-10-CM | POA: Diagnosis not present

## 2016-07-12 DIAGNOSIS — M810 Age-related osteoporosis without current pathological fracture: Secondary | ICD-10-CM | POA: Diagnosis not present

## 2016-07-12 DIAGNOSIS — R412 Retrograde amnesia: Secondary | ICD-10-CM | POA: Diagnosis not present

## 2016-07-12 DIAGNOSIS — I10 Essential (primary) hypertension: Secondary | ICD-10-CM | POA: Diagnosis not present

## 2016-07-12 DIAGNOSIS — F419 Anxiety disorder, unspecified: Secondary | ICD-10-CM | POA: Diagnosis not present

## 2016-07-13 DIAGNOSIS — R412 Retrograde amnesia: Secondary | ICD-10-CM | POA: Diagnosis not present

## 2016-07-13 DIAGNOSIS — I10 Essential (primary) hypertension: Secondary | ICD-10-CM | POA: Diagnosis not present

## 2016-07-13 DIAGNOSIS — F419 Anxiety disorder, unspecified: Secondary | ICD-10-CM | POA: Diagnosis not present

## 2016-07-13 DIAGNOSIS — M810 Age-related osteoporosis without current pathological fracture: Secondary | ICD-10-CM | POA: Diagnosis not present

## 2016-07-14 DIAGNOSIS — M810 Age-related osteoporosis without current pathological fracture: Secondary | ICD-10-CM | POA: Diagnosis not present

## 2016-07-14 DIAGNOSIS — R412 Retrograde amnesia: Secondary | ICD-10-CM | POA: Diagnosis not present

## 2016-07-14 DIAGNOSIS — I10 Essential (primary) hypertension: Secondary | ICD-10-CM | POA: Diagnosis not present

## 2016-07-14 DIAGNOSIS — F419 Anxiety disorder, unspecified: Secondary | ICD-10-CM | POA: Diagnosis not present

## 2016-07-15 DIAGNOSIS — M810 Age-related osteoporosis without current pathological fracture: Secondary | ICD-10-CM | POA: Diagnosis not present

## 2016-07-15 DIAGNOSIS — R412 Retrograde amnesia: Secondary | ICD-10-CM | POA: Diagnosis not present

## 2016-07-15 DIAGNOSIS — F419 Anxiety disorder, unspecified: Secondary | ICD-10-CM | POA: Diagnosis not present

## 2016-07-15 DIAGNOSIS — I10 Essential (primary) hypertension: Secondary | ICD-10-CM | POA: Diagnosis not present

## 2016-07-16 DIAGNOSIS — R412 Retrograde amnesia: Secondary | ICD-10-CM | POA: Diagnosis not present

## 2016-07-16 DIAGNOSIS — I10 Essential (primary) hypertension: Secondary | ICD-10-CM | POA: Diagnosis not present

## 2016-07-16 DIAGNOSIS — F419 Anxiety disorder, unspecified: Secondary | ICD-10-CM | POA: Diagnosis not present

## 2016-07-16 DIAGNOSIS — M810 Age-related osteoporosis without current pathological fracture: Secondary | ICD-10-CM | POA: Diagnosis not present

## 2016-07-17 DIAGNOSIS — M810 Age-related osteoporosis without current pathological fracture: Secondary | ICD-10-CM | POA: Diagnosis not present

## 2016-07-17 DIAGNOSIS — I10 Essential (primary) hypertension: Secondary | ICD-10-CM | POA: Diagnosis not present

## 2016-07-17 DIAGNOSIS — R412 Retrograde amnesia: Secondary | ICD-10-CM | POA: Diagnosis not present

## 2016-07-17 DIAGNOSIS — F419 Anxiety disorder, unspecified: Secondary | ICD-10-CM | POA: Diagnosis not present

## 2016-07-18 DIAGNOSIS — R412 Retrograde amnesia: Secondary | ICD-10-CM | POA: Diagnosis not present

## 2016-07-18 DIAGNOSIS — F419 Anxiety disorder, unspecified: Secondary | ICD-10-CM | POA: Diagnosis not present

## 2016-07-18 DIAGNOSIS — I10 Essential (primary) hypertension: Secondary | ICD-10-CM | POA: Diagnosis not present

## 2016-07-18 DIAGNOSIS — M810 Age-related osteoporosis without current pathological fracture: Secondary | ICD-10-CM | POA: Diagnosis not present

## 2016-07-19 DIAGNOSIS — F419 Anxiety disorder, unspecified: Secondary | ICD-10-CM | POA: Diagnosis not present

## 2016-07-19 DIAGNOSIS — I10 Essential (primary) hypertension: Secondary | ICD-10-CM | POA: Diagnosis not present

## 2016-07-19 DIAGNOSIS — M810 Age-related osteoporosis without current pathological fracture: Secondary | ICD-10-CM | POA: Diagnosis not present

## 2016-07-19 DIAGNOSIS — R412 Retrograde amnesia: Secondary | ICD-10-CM | POA: Diagnosis not present

## 2016-07-20 DIAGNOSIS — I10 Essential (primary) hypertension: Secondary | ICD-10-CM | POA: Diagnosis not present

## 2016-07-20 DIAGNOSIS — M810 Age-related osteoporosis without current pathological fracture: Secondary | ICD-10-CM | POA: Diagnosis not present

## 2016-07-20 DIAGNOSIS — F419 Anxiety disorder, unspecified: Secondary | ICD-10-CM | POA: Diagnosis not present

## 2016-07-20 DIAGNOSIS — R412 Retrograde amnesia: Secondary | ICD-10-CM | POA: Diagnosis not present

## 2016-07-21 DIAGNOSIS — F419 Anxiety disorder, unspecified: Secondary | ICD-10-CM | POA: Diagnosis not present

## 2016-07-21 DIAGNOSIS — R412 Retrograde amnesia: Secondary | ICD-10-CM | POA: Diagnosis not present

## 2016-07-21 DIAGNOSIS — M810 Age-related osteoporosis without current pathological fracture: Secondary | ICD-10-CM | POA: Diagnosis not present

## 2016-07-21 DIAGNOSIS — I10 Essential (primary) hypertension: Secondary | ICD-10-CM | POA: Diagnosis not present

## 2016-07-22 DIAGNOSIS — F419 Anxiety disorder, unspecified: Secondary | ICD-10-CM | POA: Diagnosis not present

## 2016-07-22 DIAGNOSIS — M810 Age-related osteoporosis without current pathological fracture: Secondary | ICD-10-CM | POA: Diagnosis not present

## 2016-07-22 DIAGNOSIS — I10 Essential (primary) hypertension: Secondary | ICD-10-CM | POA: Diagnosis not present

## 2016-07-22 DIAGNOSIS — R412 Retrograde amnesia: Secondary | ICD-10-CM | POA: Diagnosis not present

## 2016-07-23 DIAGNOSIS — I10 Essential (primary) hypertension: Secondary | ICD-10-CM | POA: Diagnosis not present

## 2016-07-23 DIAGNOSIS — M810 Age-related osteoporosis without current pathological fracture: Secondary | ICD-10-CM | POA: Diagnosis not present

## 2016-07-23 DIAGNOSIS — R412 Retrograde amnesia: Secondary | ICD-10-CM | POA: Diagnosis not present

## 2016-07-23 DIAGNOSIS — F419 Anxiety disorder, unspecified: Secondary | ICD-10-CM | POA: Diagnosis not present

## 2016-07-24 DIAGNOSIS — F419 Anxiety disorder, unspecified: Secondary | ICD-10-CM | POA: Diagnosis not present

## 2016-07-24 DIAGNOSIS — R412 Retrograde amnesia: Secondary | ICD-10-CM | POA: Diagnosis not present

## 2016-07-24 DIAGNOSIS — M810 Age-related osteoporosis without current pathological fracture: Secondary | ICD-10-CM | POA: Diagnosis not present

## 2016-07-24 DIAGNOSIS — I10 Essential (primary) hypertension: Secondary | ICD-10-CM | POA: Diagnosis not present

## 2016-07-25 DIAGNOSIS — R412 Retrograde amnesia: Secondary | ICD-10-CM | POA: Diagnosis not present

## 2016-07-25 DIAGNOSIS — F419 Anxiety disorder, unspecified: Secondary | ICD-10-CM | POA: Diagnosis not present

## 2016-07-25 DIAGNOSIS — M810 Age-related osteoporosis without current pathological fracture: Secondary | ICD-10-CM | POA: Diagnosis not present

## 2016-07-25 DIAGNOSIS — I10 Essential (primary) hypertension: Secondary | ICD-10-CM | POA: Diagnosis not present

## 2016-07-26 DIAGNOSIS — M810 Age-related osteoporosis without current pathological fracture: Secondary | ICD-10-CM | POA: Diagnosis not present

## 2016-07-26 DIAGNOSIS — F419 Anxiety disorder, unspecified: Secondary | ICD-10-CM | POA: Diagnosis not present

## 2016-07-26 DIAGNOSIS — R412 Retrograde amnesia: Secondary | ICD-10-CM | POA: Diagnosis not present

## 2016-07-26 DIAGNOSIS — I10 Essential (primary) hypertension: Secondary | ICD-10-CM | POA: Diagnosis not present

## 2016-07-27 DIAGNOSIS — M810 Age-related osteoporosis without current pathological fracture: Secondary | ICD-10-CM | POA: Diagnosis not present

## 2016-07-27 DIAGNOSIS — I10 Essential (primary) hypertension: Secondary | ICD-10-CM | POA: Diagnosis not present

## 2016-07-27 DIAGNOSIS — R412 Retrograde amnesia: Secondary | ICD-10-CM | POA: Diagnosis not present

## 2016-07-27 DIAGNOSIS — F419 Anxiety disorder, unspecified: Secondary | ICD-10-CM | POA: Diagnosis not present

## 2016-07-28 DIAGNOSIS — R412 Retrograde amnesia: Secondary | ICD-10-CM | POA: Diagnosis not present

## 2016-07-28 DIAGNOSIS — M810 Age-related osteoporosis without current pathological fracture: Secondary | ICD-10-CM | POA: Diagnosis not present

## 2016-07-28 DIAGNOSIS — F419 Anxiety disorder, unspecified: Secondary | ICD-10-CM | POA: Diagnosis not present

## 2016-07-28 DIAGNOSIS — I10 Essential (primary) hypertension: Secondary | ICD-10-CM | POA: Diagnosis not present

## 2016-07-29 DIAGNOSIS — I10 Essential (primary) hypertension: Secondary | ICD-10-CM | POA: Diagnosis not present

## 2016-07-29 DIAGNOSIS — F419 Anxiety disorder, unspecified: Secondary | ICD-10-CM | POA: Diagnosis not present

## 2016-07-29 DIAGNOSIS — M810 Age-related osteoporosis without current pathological fracture: Secondary | ICD-10-CM | POA: Diagnosis not present

## 2016-07-29 DIAGNOSIS — R412 Retrograde amnesia: Secondary | ICD-10-CM | POA: Diagnosis not present

## 2016-07-30 DIAGNOSIS — I10 Essential (primary) hypertension: Secondary | ICD-10-CM | POA: Diagnosis not present

## 2016-07-30 DIAGNOSIS — F419 Anxiety disorder, unspecified: Secondary | ICD-10-CM | POA: Diagnosis not present

## 2016-07-30 DIAGNOSIS — R412 Retrograde amnesia: Secondary | ICD-10-CM | POA: Diagnosis not present

## 2016-07-30 DIAGNOSIS — M810 Age-related osteoporosis without current pathological fracture: Secondary | ICD-10-CM | POA: Diagnosis not present

## 2016-07-31 DIAGNOSIS — I10 Essential (primary) hypertension: Secondary | ICD-10-CM | POA: Diagnosis not present

## 2016-07-31 DIAGNOSIS — R412 Retrograde amnesia: Secondary | ICD-10-CM | POA: Diagnosis not present

## 2016-07-31 DIAGNOSIS — M810 Age-related osteoporosis without current pathological fracture: Secondary | ICD-10-CM | POA: Diagnosis not present

## 2016-07-31 DIAGNOSIS — F419 Anxiety disorder, unspecified: Secondary | ICD-10-CM | POA: Diagnosis not present

## 2016-08-01 DIAGNOSIS — F419 Anxiety disorder, unspecified: Secondary | ICD-10-CM | POA: Diagnosis not present

## 2016-08-01 DIAGNOSIS — R412 Retrograde amnesia: Secondary | ICD-10-CM | POA: Diagnosis not present

## 2016-08-01 DIAGNOSIS — M810 Age-related osteoporosis without current pathological fracture: Secondary | ICD-10-CM | POA: Diagnosis not present

## 2016-08-01 DIAGNOSIS — I10 Essential (primary) hypertension: Secondary | ICD-10-CM | POA: Diagnosis not present

## 2016-08-02 DIAGNOSIS — R412 Retrograde amnesia: Secondary | ICD-10-CM | POA: Diagnosis not present

## 2016-08-02 DIAGNOSIS — F419 Anxiety disorder, unspecified: Secondary | ICD-10-CM | POA: Diagnosis not present

## 2016-08-02 DIAGNOSIS — I10 Essential (primary) hypertension: Secondary | ICD-10-CM | POA: Diagnosis not present

## 2016-08-02 DIAGNOSIS — M810 Age-related osteoporosis without current pathological fracture: Secondary | ICD-10-CM | POA: Diagnosis not present

## 2016-08-03 DIAGNOSIS — F419 Anxiety disorder, unspecified: Secondary | ICD-10-CM | POA: Diagnosis not present

## 2016-08-03 DIAGNOSIS — I10 Essential (primary) hypertension: Secondary | ICD-10-CM | POA: Diagnosis not present

## 2016-08-03 DIAGNOSIS — M810 Age-related osteoporosis without current pathological fracture: Secondary | ICD-10-CM | POA: Diagnosis not present

## 2016-08-03 DIAGNOSIS — R412 Retrograde amnesia: Secondary | ICD-10-CM | POA: Diagnosis not present

## 2016-08-04 DIAGNOSIS — F419 Anxiety disorder, unspecified: Secondary | ICD-10-CM | POA: Diagnosis not present

## 2016-08-04 DIAGNOSIS — R412 Retrograde amnesia: Secondary | ICD-10-CM | POA: Diagnosis not present

## 2016-08-04 DIAGNOSIS — I10 Essential (primary) hypertension: Secondary | ICD-10-CM | POA: Diagnosis not present

## 2016-08-04 DIAGNOSIS — M810 Age-related osteoporosis without current pathological fracture: Secondary | ICD-10-CM | POA: Diagnosis not present

## 2016-08-05 DIAGNOSIS — F419 Anxiety disorder, unspecified: Secondary | ICD-10-CM | POA: Diagnosis not present

## 2016-08-05 DIAGNOSIS — R412 Retrograde amnesia: Secondary | ICD-10-CM | POA: Diagnosis not present

## 2016-08-05 DIAGNOSIS — M810 Age-related osteoporosis without current pathological fracture: Secondary | ICD-10-CM | POA: Diagnosis not present

## 2016-08-05 DIAGNOSIS — I10 Essential (primary) hypertension: Secondary | ICD-10-CM | POA: Diagnosis not present

## 2016-08-06 DIAGNOSIS — M810 Age-related osteoporosis without current pathological fracture: Secondary | ICD-10-CM | POA: Diagnosis not present

## 2016-08-06 DIAGNOSIS — F419 Anxiety disorder, unspecified: Secondary | ICD-10-CM | POA: Diagnosis not present

## 2016-08-06 DIAGNOSIS — I10 Essential (primary) hypertension: Secondary | ICD-10-CM | POA: Diagnosis not present

## 2016-08-06 DIAGNOSIS — R412 Retrograde amnesia: Secondary | ICD-10-CM | POA: Diagnosis not present

## 2016-08-07 DIAGNOSIS — I10 Essential (primary) hypertension: Secondary | ICD-10-CM | POA: Diagnosis not present

## 2016-08-07 DIAGNOSIS — F419 Anxiety disorder, unspecified: Secondary | ICD-10-CM | POA: Diagnosis not present

## 2016-08-07 DIAGNOSIS — R412 Retrograde amnesia: Secondary | ICD-10-CM | POA: Diagnosis not present

## 2016-08-07 DIAGNOSIS — M810 Age-related osteoporosis without current pathological fracture: Secondary | ICD-10-CM | POA: Diagnosis not present

## 2016-08-08 DIAGNOSIS — M810 Age-related osteoporosis without current pathological fracture: Secondary | ICD-10-CM | POA: Diagnosis not present

## 2016-08-08 DIAGNOSIS — I10 Essential (primary) hypertension: Secondary | ICD-10-CM | POA: Diagnosis not present

## 2016-08-08 DIAGNOSIS — F419 Anxiety disorder, unspecified: Secondary | ICD-10-CM | POA: Diagnosis not present

## 2016-08-08 DIAGNOSIS — R412 Retrograde amnesia: Secondary | ICD-10-CM | POA: Diagnosis not present

## 2016-08-09 DIAGNOSIS — I10 Essential (primary) hypertension: Secondary | ICD-10-CM | POA: Diagnosis not present

## 2016-08-09 DIAGNOSIS — M810 Age-related osteoporosis without current pathological fracture: Secondary | ICD-10-CM | POA: Diagnosis not present

## 2016-08-09 DIAGNOSIS — R412 Retrograde amnesia: Secondary | ICD-10-CM | POA: Diagnosis not present

## 2016-08-09 DIAGNOSIS — F419 Anxiety disorder, unspecified: Secondary | ICD-10-CM | POA: Diagnosis not present

## 2016-08-10 DIAGNOSIS — I10 Essential (primary) hypertension: Secondary | ICD-10-CM | POA: Diagnosis not present

## 2016-08-10 DIAGNOSIS — F419 Anxiety disorder, unspecified: Secondary | ICD-10-CM | POA: Diagnosis not present

## 2016-08-10 DIAGNOSIS — M810 Age-related osteoporosis without current pathological fracture: Secondary | ICD-10-CM | POA: Diagnosis not present

## 2016-08-10 DIAGNOSIS — R412 Retrograde amnesia: Secondary | ICD-10-CM | POA: Diagnosis not present

## 2016-08-11 DIAGNOSIS — M810 Age-related osteoporosis without current pathological fracture: Secondary | ICD-10-CM | POA: Diagnosis not present

## 2016-08-11 DIAGNOSIS — I10 Essential (primary) hypertension: Secondary | ICD-10-CM | POA: Diagnosis not present

## 2016-08-11 DIAGNOSIS — F419 Anxiety disorder, unspecified: Secondary | ICD-10-CM | POA: Diagnosis not present

## 2016-08-11 DIAGNOSIS — R412 Retrograde amnesia: Secondary | ICD-10-CM | POA: Diagnosis not present

## 2016-08-12 DIAGNOSIS — R412 Retrograde amnesia: Secondary | ICD-10-CM | POA: Diagnosis not present

## 2016-08-12 DIAGNOSIS — M810 Age-related osteoporosis without current pathological fracture: Secondary | ICD-10-CM | POA: Diagnosis not present

## 2016-08-12 DIAGNOSIS — F419 Anxiety disorder, unspecified: Secondary | ICD-10-CM | POA: Diagnosis not present

## 2016-08-12 DIAGNOSIS — I10 Essential (primary) hypertension: Secondary | ICD-10-CM | POA: Diagnosis not present

## 2016-08-13 DIAGNOSIS — R412 Retrograde amnesia: Secondary | ICD-10-CM | POA: Diagnosis not present

## 2016-08-13 DIAGNOSIS — I10 Essential (primary) hypertension: Secondary | ICD-10-CM | POA: Diagnosis not present

## 2016-08-13 DIAGNOSIS — M810 Age-related osteoporosis without current pathological fracture: Secondary | ICD-10-CM | POA: Diagnosis not present

## 2016-08-13 DIAGNOSIS — F419 Anxiety disorder, unspecified: Secondary | ICD-10-CM | POA: Diagnosis not present

## 2016-08-14 DIAGNOSIS — F419 Anxiety disorder, unspecified: Secondary | ICD-10-CM | POA: Diagnosis not present

## 2016-08-14 DIAGNOSIS — M810 Age-related osteoporosis without current pathological fracture: Secondary | ICD-10-CM | POA: Diagnosis not present

## 2016-08-14 DIAGNOSIS — I10 Essential (primary) hypertension: Secondary | ICD-10-CM | POA: Diagnosis not present

## 2016-08-14 DIAGNOSIS — R412 Retrograde amnesia: Secondary | ICD-10-CM | POA: Diagnosis not present

## 2016-08-15 DIAGNOSIS — R412 Retrograde amnesia: Secondary | ICD-10-CM | POA: Diagnosis not present

## 2016-08-15 DIAGNOSIS — I10 Essential (primary) hypertension: Secondary | ICD-10-CM | POA: Diagnosis not present

## 2016-08-15 DIAGNOSIS — F419 Anxiety disorder, unspecified: Secondary | ICD-10-CM | POA: Diagnosis not present

## 2016-08-15 DIAGNOSIS — M810 Age-related osteoporosis without current pathological fracture: Secondary | ICD-10-CM | POA: Diagnosis not present

## 2016-08-16 DIAGNOSIS — I10 Essential (primary) hypertension: Secondary | ICD-10-CM | POA: Diagnosis not present

## 2016-08-16 DIAGNOSIS — R412 Retrograde amnesia: Secondary | ICD-10-CM | POA: Diagnosis not present

## 2016-08-16 DIAGNOSIS — M810 Age-related osteoporosis without current pathological fracture: Secondary | ICD-10-CM | POA: Diagnosis not present

## 2016-08-16 DIAGNOSIS — F419 Anxiety disorder, unspecified: Secondary | ICD-10-CM | POA: Diagnosis not present

## 2016-08-17 DIAGNOSIS — I10 Essential (primary) hypertension: Secondary | ICD-10-CM | POA: Diagnosis not present

## 2016-08-17 DIAGNOSIS — F419 Anxiety disorder, unspecified: Secondary | ICD-10-CM | POA: Diagnosis not present

## 2016-08-17 DIAGNOSIS — R412 Retrograde amnesia: Secondary | ICD-10-CM | POA: Diagnosis not present

## 2016-08-17 DIAGNOSIS — M810 Age-related osteoporosis without current pathological fracture: Secondary | ICD-10-CM | POA: Diagnosis not present

## 2016-08-18 DIAGNOSIS — R412 Retrograde amnesia: Secondary | ICD-10-CM | POA: Diagnosis not present

## 2016-08-18 DIAGNOSIS — F419 Anxiety disorder, unspecified: Secondary | ICD-10-CM | POA: Diagnosis not present

## 2016-08-18 DIAGNOSIS — M810 Age-related osteoporosis without current pathological fracture: Secondary | ICD-10-CM | POA: Diagnosis not present

## 2016-08-18 DIAGNOSIS — I10 Essential (primary) hypertension: Secondary | ICD-10-CM | POA: Diagnosis not present

## 2016-08-19 DIAGNOSIS — I10 Essential (primary) hypertension: Secondary | ICD-10-CM | POA: Diagnosis not present

## 2016-08-19 DIAGNOSIS — M810 Age-related osteoporosis without current pathological fracture: Secondary | ICD-10-CM | POA: Diagnosis not present

## 2016-08-19 DIAGNOSIS — F419 Anxiety disorder, unspecified: Secondary | ICD-10-CM | POA: Diagnosis not present

## 2016-08-19 DIAGNOSIS — R412 Retrograde amnesia: Secondary | ICD-10-CM | POA: Diagnosis not present

## 2016-08-20 DIAGNOSIS — F419 Anxiety disorder, unspecified: Secondary | ICD-10-CM | POA: Diagnosis not present

## 2016-08-20 DIAGNOSIS — R412 Retrograde amnesia: Secondary | ICD-10-CM | POA: Diagnosis not present

## 2016-08-20 DIAGNOSIS — M810 Age-related osteoporosis without current pathological fracture: Secondary | ICD-10-CM | POA: Diagnosis not present

## 2016-08-20 DIAGNOSIS — I10 Essential (primary) hypertension: Secondary | ICD-10-CM | POA: Diagnosis not present

## 2016-08-21 DIAGNOSIS — R412 Retrograde amnesia: Secondary | ICD-10-CM | POA: Diagnosis not present

## 2016-08-21 DIAGNOSIS — F419 Anxiety disorder, unspecified: Secondary | ICD-10-CM | POA: Diagnosis not present

## 2016-08-21 DIAGNOSIS — M810 Age-related osteoporosis without current pathological fracture: Secondary | ICD-10-CM | POA: Diagnosis not present

## 2016-08-21 DIAGNOSIS — I10 Essential (primary) hypertension: Secondary | ICD-10-CM | POA: Diagnosis not present

## 2016-08-22 DIAGNOSIS — R412 Retrograde amnesia: Secondary | ICD-10-CM | POA: Diagnosis not present

## 2016-08-22 DIAGNOSIS — I10 Essential (primary) hypertension: Secondary | ICD-10-CM | POA: Diagnosis not present

## 2016-08-22 DIAGNOSIS — M810 Age-related osteoporosis without current pathological fracture: Secondary | ICD-10-CM | POA: Diagnosis not present

## 2016-08-22 DIAGNOSIS — F419 Anxiety disorder, unspecified: Secondary | ICD-10-CM | POA: Diagnosis not present

## 2016-08-23 DIAGNOSIS — F419 Anxiety disorder, unspecified: Secondary | ICD-10-CM | POA: Diagnosis not present

## 2016-08-23 DIAGNOSIS — M810 Age-related osteoporosis without current pathological fracture: Secondary | ICD-10-CM | POA: Diagnosis not present

## 2016-08-23 DIAGNOSIS — R412 Retrograde amnesia: Secondary | ICD-10-CM | POA: Diagnosis not present

## 2016-08-23 DIAGNOSIS — I10 Essential (primary) hypertension: Secondary | ICD-10-CM | POA: Diagnosis not present

## 2016-08-24 DIAGNOSIS — M810 Age-related osteoporosis without current pathological fracture: Secondary | ICD-10-CM | POA: Diagnosis not present

## 2016-08-24 DIAGNOSIS — R412 Retrograde amnesia: Secondary | ICD-10-CM | POA: Diagnosis not present

## 2016-08-24 DIAGNOSIS — F419 Anxiety disorder, unspecified: Secondary | ICD-10-CM | POA: Diagnosis not present

## 2016-08-24 DIAGNOSIS — I10 Essential (primary) hypertension: Secondary | ICD-10-CM | POA: Diagnosis not present

## 2016-08-25 DIAGNOSIS — M810 Age-related osteoporosis without current pathological fracture: Secondary | ICD-10-CM | POA: Diagnosis not present

## 2016-08-25 DIAGNOSIS — F419 Anxiety disorder, unspecified: Secondary | ICD-10-CM | POA: Diagnosis not present

## 2016-08-25 DIAGNOSIS — R412 Retrograde amnesia: Secondary | ICD-10-CM | POA: Diagnosis not present

## 2016-08-25 DIAGNOSIS — I10 Essential (primary) hypertension: Secondary | ICD-10-CM | POA: Diagnosis not present

## 2016-08-26 DIAGNOSIS — F419 Anxiety disorder, unspecified: Secondary | ICD-10-CM | POA: Diagnosis not present

## 2016-08-26 DIAGNOSIS — R412 Retrograde amnesia: Secondary | ICD-10-CM | POA: Diagnosis not present

## 2016-08-26 DIAGNOSIS — M810 Age-related osteoporosis without current pathological fracture: Secondary | ICD-10-CM | POA: Diagnosis not present

## 2016-08-26 DIAGNOSIS — I10 Essential (primary) hypertension: Secondary | ICD-10-CM | POA: Diagnosis not present

## 2016-08-27 DIAGNOSIS — M810 Age-related osteoporosis without current pathological fracture: Secondary | ICD-10-CM | POA: Diagnosis not present

## 2016-08-27 DIAGNOSIS — F419 Anxiety disorder, unspecified: Secondary | ICD-10-CM | POA: Diagnosis not present

## 2016-08-27 DIAGNOSIS — R412 Retrograde amnesia: Secondary | ICD-10-CM | POA: Diagnosis not present

## 2016-08-27 DIAGNOSIS — I10 Essential (primary) hypertension: Secondary | ICD-10-CM | POA: Diagnosis not present

## 2016-08-28 DIAGNOSIS — I10 Essential (primary) hypertension: Secondary | ICD-10-CM | POA: Diagnosis not present

## 2016-08-28 DIAGNOSIS — M810 Age-related osteoporosis without current pathological fracture: Secondary | ICD-10-CM | POA: Diagnosis not present

## 2016-08-28 DIAGNOSIS — F419 Anxiety disorder, unspecified: Secondary | ICD-10-CM | POA: Diagnosis not present

## 2016-08-28 DIAGNOSIS — R412 Retrograde amnesia: Secondary | ICD-10-CM | POA: Diagnosis not present

## 2016-08-29 DIAGNOSIS — F419 Anxiety disorder, unspecified: Secondary | ICD-10-CM | POA: Diagnosis not present

## 2016-08-29 DIAGNOSIS — I10 Essential (primary) hypertension: Secondary | ICD-10-CM | POA: Diagnosis not present

## 2016-08-29 DIAGNOSIS — M810 Age-related osteoporosis without current pathological fracture: Secondary | ICD-10-CM | POA: Diagnosis not present

## 2016-08-29 DIAGNOSIS — R412 Retrograde amnesia: Secondary | ICD-10-CM | POA: Diagnosis not present

## 2016-08-30 DIAGNOSIS — M810 Age-related osteoporosis without current pathological fracture: Secondary | ICD-10-CM | POA: Diagnosis not present

## 2016-08-30 DIAGNOSIS — F419 Anxiety disorder, unspecified: Secondary | ICD-10-CM | POA: Diagnosis not present

## 2016-08-30 DIAGNOSIS — R412 Retrograde amnesia: Secondary | ICD-10-CM | POA: Diagnosis not present

## 2016-08-30 DIAGNOSIS — I10 Essential (primary) hypertension: Secondary | ICD-10-CM | POA: Diagnosis not present

## 2016-08-31 DIAGNOSIS — M810 Age-related osteoporosis without current pathological fracture: Secondary | ICD-10-CM | POA: Diagnosis not present

## 2016-08-31 DIAGNOSIS — F419 Anxiety disorder, unspecified: Secondary | ICD-10-CM | POA: Diagnosis not present

## 2016-08-31 DIAGNOSIS — R412 Retrograde amnesia: Secondary | ICD-10-CM | POA: Diagnosis not present

## 2016-08-31 DIAGNOSIS — I10 Essential (primary) hypertension: Secondary | ICD-10-CM | POA: Diagnosis not present

## 2016-09-01 DIAGNOSIS — R412 Retrograde amnesia: Secondary | ICD-10-CM | POA: Diagnosis not present

## 2016-09-01 DIAGNOSIS — I10 Essential (primary) hypertension: Secondary | ICD-10-CM | POA: Diagnosis not present

## 2016-09-01 DIAGNOSIS — M810 Age-related osteoporosis without current pathological fracture: Secondary | ICD-10-CM | POA: Diagnosis not present

## 2016-09-01 DIAGNOSIS — F419 Anxiety disorder, unspecified: Secondary | ICD-10-CM | POA: Diagnosis not present

## 2016-09-02 DIAGNOSIS — F419 Anxiety disorder, unspecified: Secondary | ICD-10-CM | POA: Diagnosis not present

## 2016-09-02 DIAGNOSIS — I10 Essential (primary) hypertension: Secondary | ICD-10-CM | POA: Diagnosis not present

## 2016-09-02 DIAGNOSIS — M810 Age-related osteoporosis without current pathological fracture: Secondary | ICD-10-CM | POA: Diagnosis not present

## 2016-09-02 DIAGNOSIS — R412 Retrograde amnesia: Secondary | ICD-10-CM | POA: Diagnosis not present

## 2016-09-03 DIAGNOSIS — I10 Essential (primary) hypertension: Secondary | ICD-10-CM | POA: Diagnosis not present

## 2016-09-03 DIAGNOSIS — M810 Age-related osteoporosis without current pathological fracture: Secondary | ICD-10-CM | POA: Diagnosis not present

## 2016-09-03 DIAGNOSIS — F419 Anxiety disorder, unspecified: Secondary | ICD-10-CM | POA: Diagnosis not present

## 2016-09-03 DIAGNOSIS — R412 Retrograde amnesia: Secondary | ICD-10-CM | POA: Diagnosis not present

## 2016-09-04 DIAGNOSIS — R412 Retrograde amnesia: Secondary | ICD-10-CM | POA: Diagnosis not present

## 2016-09-04 DIAGNOSIS — I10 Essential (primary) hypertension: Secondary | ICD-10-CM | POA: Diagnosis not present

## 2016-09-04 DIAGNOSIS — F419 Anxiety disorder, unspecified: Secondary | ICD-10-CM | POA: Diagnosis not present

## 2016-09-04 DIAGNOSIS — M810 Age-related osteoporosis without current pathological fracture: Secondary | ICD-10-CM | POA: Diagnosis not present

## 2016-09-05 DIAGNOSIS — M810 Age-related osteoporosis without current pathological fracture: Secondary | ICD-10-CM | POA: Diagnosis not present

## 2016-09-05 DIAGNOSIS — Z6824 Body mass index (BMI) 24.0-24.9, adult: Secondary | ICD-10-CM | POA: Diagnosis not present

## 2016-09-05 DIAGNOSIS — K219 Gastro-esophageal reflux disease without esophagitis: Secondary | ICD-10-CM | POA: Diagnosis not present

## 2016-09-05 DIAGNOSIS — I10 Essential (primary) hypertension: Secondary | ICD-10-CM | POA: Diagnosis not present

## 2016-09-05 DIAGNOSIS — Z79899 Other long term (current) drug therapy: Secondary | ICD-10-CM | POA: Diagnosis not present

## 2016-09-05 DIAGNOSIS — F419 Anxiety disorder, unspecified: Secondary | ICD-10-CM | POA: Diagnosis not present

## 2016-09-05 DIAGNOSIS — R412 Retrograde amnesia: Secondary | ICD-10-CM | POA: Diagnosis not present

## 2016-09-06 DIAGNOSIS — R412 Retrograde amnesia: Secondary | ICD-10-CM | POA: Diagnosis not present

## 2016-09-06 DIAGNOSIS — M810 Age-related osteoporosis without current pathological fracture: Secondary | ICD-10-CM | POA: Diagnosis not present

## 2016-09-06 DIAGNOSIS — F419 Anxiety disorder, unspecified: Secondary | ICD-10-CM | POA: Diagnosis not present

## 2016-09-06 DIAGNOSIS — I10 Essential (primary) hypertension: Secondary | ICD-10-CM | POA: Diagnosis not present

## 2016-09-07 DIAGNOSIS — I10 Essential (primary) hypertension: Secondary | ICD-10-CM | POA: Diagnosis not present

## 2016-09-07 DIAGNOSIS — M810 Age-related osteoporosis without current pathological fracture: Secondary | ICD-10-CM | POA: Diagnosis not present

## 2016-09-07 DIAGNOSIS — F419 Anxiety disorder, unspecified: Secondary | ICD-10-CM | POA: Diagnosis not present

## 2016-09-07 DIAGNOSIS — R412 Retrograde amnesia: Secondary | ICD-10-CM | POA: Diagnosis not present

## 2016-09-08 DIAGNOSIS — M810 Age-related osteoporosis without current pathological fracture: Secondary | ICD-10-CM | POA: Diagnosis not present

## 2016-09-08 DIAGNOSIS — F419 Anxiety disorder, unspecified: Secondary | ICD-10-CM | POA: Diagnosis not present

## 2016-09-08 DIAGNOSIS — R412 Retrograde amnesia: Secondary | ICD-10-CM | POA: Diagnosis not present

## 2016-09-08 DIAGNOSIS — I10 Essential (primary) hypertension: Secondary | ICD-10-CM | POA: Diagnosis not present

## 2016-09-09 DIAGNOSIS — F419 Anxiety disorder, unspecified: Secondary | ICD-10-CM | POA: Diagnosis not present

## 2016-09-09 DIAGNOSIS — M810 Age-related osteoporosis without current pathological fracture: Secondary | ICD-10-CM | POA: Diagnosis not present

## 2016-09-09 DIAGNOSIS — I10 Essential (primary) hypertension: Secondary | ICD-10-CM | POA: Diagnosis not present

## 2016-09-09 DIAGNOSIS — R412 Retrograde amnesia: Secondary | ICD-10-CM | POA: Diagnosis not present

## 2016-09-10 DIAGNOSIS — I10 Essential (primary) hypertension: Secondary | ICD-10-CM | POA: Diagnosis not present

## 2016-09-10 DIAGNOSIS — R412 Retrograde amnesia: Secondary | ICD-10-CM | POA: Diagnosis not present

## 2016-09-10 DIAGNOSIS — F419 Anxiety disorder, unspecified: Secondary | ICD-10-CM | POA: Diagnosis not present

## 2016-09-10 DIAGNOSIS — M810 Age-related osteoporosis without current pathological fracture: Secondary | ICD-10-CM | POA: Diagnosis not present

## 2016-09-11 DIAGNOSIS — R412 Retrograde amnesia: Secondary | ICD-10-CM | POA: Diagnosis not present

## 2016-09-11 DIAGNOSIS — F419 Anxiety disorder, unspecified: Secondary | ICD-10-CM | POA: Diagnosis not present

## 2016-09-11 DIAGNOSIS — I10 Essential (primary) hypertension: Secondary | ICD-10-CM | POA: Diagnosis not present

## 2016-09-11 DIAGNOSIS — M810 Age-related osteoporosis without current pathological fracture: Secondary | ICD-10-CM | POA: Diagnosis not present

## 2016-09-12 DIAGNOSIS — F419 Anxiety disorder, unspecified: Secondary | ICD-10-CM | POA: Diagnosis not present

## 2016-09-12 DIAGNOSIS — R412 Retrograde amnesia: Secondary | ICD-10-CM | POA: Diagnosis not present

## 2016-09-12 DIAGNOSIS — M810 Age-related osteoporosis without current pathological fracture: Secondary | ICD-10-CM | POA: Diagnosis not present

## 2016-09-12 DIAGNOSIS — I10 Essential (primary) hypertension: Secondary | ICD-10-CM | POA: Diagnosis not present

## 2016-09-13 DIAGNOSIS — F419 Anxiety disorder, unspecified: Secondary | ICD-10-CM | POA: Diagnosis not present

## 2016-09-13 DIAGNOSIS — M810 Age-related osteoporosis without current pathological fracture: Secondary | ICD-10-CM | POA: Diagnosis not present

## 2016-09-13 DIAGNOSIS — I10 Essential (primary) hypertension: Secondary | ICD-10-CM | POA: Diagnosis not present

## 2016-09-13 DIAGNOSIS — R412 Retrograde amnesia: Secondary | ICD-10-CM | POA: Diagnosis not present

## 2016-09-14 DIAGNOSIS — I10 Essential (primary) hypertension: Secondary | ICD-10-CM | POA: Diagnosis not present

## 2016-09-14 DIAGNOSIS — M810 Age-related osteoporosis without current pathological fracture: Secondary | ICD-10-CM | POA: Diagnosis not present

## 2016-09-14 DIAGNOSIS — F419 Anxiety disorder, unspecified: Secondary | ICD-10-CM | POA: Diagnosis not present

## 2016-09-14 DIAGNOSIS — R412 Retrograde amnesia: Secondary | ICD-10-CM | POA: Diagnosis not present

## 2016-09-15 DIAGNOSIS — I10 Essential (primary) hypertension: Secondary | ICD-10-CM | POA: Diagnosis not present

## 2016-09-15 DIAGNOSIS — M810 Age-related osteoporosis without current pathological fracture: Secondary | ICD-10-CM | POA: Diagnosis not present

## 2016-09-15 DIAGNOSIS — R412 Retrograde amnesia: Secondary | ICD-10-CM | POA: Diagnosis not present

## 2016-09-15 DIAGNOSIS — F419 Anxiety disorder, unspecified: Secondary | ICD-10-CM | POA: Diagnosis not present

## 2016-09-16 DIAGNOSIS — R412 Retrograde amnesia: Secondary | ICD-10-CM | POA: Diagnosis not present

## 2016-09-16 DIAGNOSIS — M810 Age-related osteoporosis without current pathological fracture: Secondary | ICD-10-CM | POA: Diagnosis not present

## 2016-09-16 DIAGNOSIS — I10 Essential (primary) hypertension: Secondary | ICD-10-CM | POA: Diagnosis not present

## 2016-09-16 DIAGNOSIS — F419 Anxiety disorder, unspecified: Secondary | ICD-10-CM | POA: Diagnosis not present

## 2016-09-17 DIAGNOSIS — F419 Anxiety disorder, unspecified: Secondary | ICD-10-CM | POA: Diagnosis not present

## 2016-09-17 DIAGNOSIS — R412 Retrograde amnesia: Secondary | ICD-10-CM | POA: Diagnosis not present

## 2016-09-17 DIAGNOSIS — I10 Essential (primary) hypertension: Secondary | ICD-10-CM | POA: Diagnosis not present

## 2016-09-17 DIAGNOSIS — M810 Age-related osteoporosis without current pathological fracture: Secondary | ICD-10-CM | POA: Diagnosis not present

## 2016-09-18 DIAGNOSIS — I10 Essential (primary) hypertension: Secondary | ICD-10-CM | POA: Diagnosis not present

## 2016-09-18 DIAGNOSIS — F419 Anxiety disorder, unspecified: Secondary | ICD-10-CM | POA: Diagnosis not present

## 2016-09-18 DIAGNOSIS — M810 Age-related osteoporosis without current pathological fracture: Secondary | ICD-10-CM | POA: Diagnosis not present

## 2016-09-18 DIAGNOSIS — R412 Retrograde amnesia: Secondary | ICD-10-CM | POA: Diagnosis not present

## 2016-09-19 DIAGNOSIS — F419 Anxiety disorder, unspecified: Secondary | ICD-10-CM | POA: Diagnosis not present

## 2016-09-19 DIAGNOSIS — R412 Retrograde amnesia: Secondary | ICD-10-CM | POA: Diagnosis not present

## 2016-09-19 DIAGNOSIS — M810 Age-related osteoporosis without current pathological fracture: Secondary | ICD-10-CM | POA: Diagnosis not present

## 2016-09-19 DIAGNOSIS — I10 Essential (primary) hypertension: Secondary | ICD-10-CM | POA: Diagnosis not present

## 2016-09-20 DIAGNOSIS — R412 Retrograde amnesia: Secondary | ICD-10-CM | POA: Diagnosis not present

## 2016-09-20 DIAGNOSIS — M810 Age-related osteoporosis without current pathological fracture: Secondary | ICD-10-CM | POA: Diagnosis not present

## 2016-09-20 DIAGNOSIS — I10 Essential (primary) hypertension: Secondary | ICD-10-CM | POA: Diagnosis not present

## 2016-09-20 DIAGNOSIS — F419 Anxiety disorder, unspecified: Secondary | ICD-10-CM | POA: Diagnosis not present

## 2016-09-21 DIAGNOSIS — M810 Age-related osteoporosis without current pathological fracture: Secondary | ICD-10-CM | POA: Diagnosis not present

## 2016-09-21 DIAGNOSIS — R412 Retrograde amnesia: Secondary | ICD-10-CM | POA: Diagnosis not present

## 2016-09-21 DIAGNOSIS — I10 Essential (primary) hypertension: Secondary | ICD-10-CM | POA: Diagnosis not present

## 2016-09-21 DIAGNOSIS — F419 Anxiety disorder, unspecified: Secondary | ICD-10-CM | POA: Diagnosis not present

## 2016-09-22 DIAGNOSIS — I10 Essential (primary) hypertension: Secondary | ICD-10-CM | POA: Diagnosis not present

## 2016-09-22 DIAGNOSIS — F419 Anxiety disorder, unspecified: Secondary | ICD-10-CM | POA: Diagnosis not present

## 2016-09-22 DIAGNOSIS — R412 Retrograde amnesia: Secondary | ICD-10-CM | POA: Diagnosis not present

## 2016-09-22 DIAGNOSIS — M810 Age-related osteoporosis without current pathological fracture: Secondary | ICD-10-CM | POA: Diagnosis not present

## 2016-09-23 DIAGNOSIS — R412 Retrograde amnesia: Secondary | ICD-10-CM | POA: Diagnosis not present

## 2016-09-23 DIAGNOSIS — F419 Anxiety disorder, unspecified: Secondary | ICD-10-CM | POA: Diagnosis not present

## 2016-09-23 DIAGNOSIS — M810 Age-related osteoporosis without current pathological fracture: Secondary | ICD-10-CM | POA: Diagnosis not present

## 2016-09-23 DIAGNOSIS — I10 Essential (primary) hypertension: Secondary | ICD-10-CM | POA: Diagnosis not present

## 2016-09-24 DIAGNOSIS — R412 Retrograde amnesia: Secondary | ICD-10-CM | POA: Diagnosis not present

## 2016-09-24 DIAGNOSIS — F419 Anxiety disorder, unspecified: Secondary | ICD-10-CM | POA: Diagnosis not present

## 2016-09-24 DIAGNOSIS — M810 Age-related osteoporosis without current pathological fracture: Secondary | ICD-10-CM | POA: Diagnosis not present

## 2016-09-24 DIAGNOSIS — I10 Essential (primary) hypertension: Secondary | ICD-10-CM | POA: Diagnosis not present

## 2016-09-25 DIAGNOSIS — M810 Age-related osteoporosis without current pathological fracture: Secondary | ICD-10-CM | POA: Diagnosis not present

## 2016-09-25 DIAGNOSIS — I10 Essential (primary) hypertension: Secondary | ICD-10-CM | POA: Diagnosis not present

## 2016-09-25 DIAGNOSIS — R412 Retrograde amnesia: Secondary | ICD-10-CM | POA: Diagnosis not present

## 2016-09-25 DIAGNOSIS — F419 Anxiety disorder, unspecified: Secondary | ICD-10-CM | POA: Diagnosis not present

## 2016-09-26 DIAGNOSIS — M810 Age-related osteoporosis without current pathological fracture: Secondary | ICD-10-CM | POA: Diagnosis not present

## 2016-09-26 DIAGNOSIS — R412 Retrograde amnesia: Secondary | ICD-10-CM | POA: Diagnosis not present

## 2016-09-26 DIAGNOSIS — I10 Essential (primary) hypertension: Secondary | ICD-10-CM | POA: Diagnosis not present

## 2016-09-26 DIAGNOSIS — F419 Anxiety disorder, unspecified: Secondary | ICD-10-CM | POA: Diagnosis not present

## 2016-09-27 DIAGNOSIS — M810 Age-related osteoporosis without current pathological fracture: Secondary | ICD-10-CM | POA: Diagnosis not present

## 2016-09-27 DIAGNOSIS — F419 Anxiety disorder, unspecified: Secondary | ICD-10-CM | POA: Diagnosis not present

## 2016-09-27 DIAGNOSIS — R412 Retrograde amnesia: Secondary | ICD-10-CM | POA: Diagnosis not present

## 2016-09-27 DIAGNOSIS — I10 Essential (primary) hypertension: Secondary | ICD-10-CM | POA: Diagnosis not present

## 2016-09-28 DIAGNOSIS — F419 Anxiety disorder, unspecified: Secondary | ICD-10-CM | POA: Diagnosis not present

## 2016-09-28 DIAGNOSIS — I10 Essential (primary) hypertension: Secondary | ICD-10-CM | POA: Diagnosis not present

## 2016-09-28 DIAGNOSIS — M810 Age-related osteoporosis without current pathological fracture: Secondary | ICD-10-CM | POA: Diagnosis not present

## 2016-09-28 DIAGNOSIS — R412 Retrograde amnesia: Secondary | ICD-10-CM | POA: Diagnosis not present

## 2016-09-29 DIAGNOSIS — R412 Retrograde amnesia: Secondary | ICD-10-CM | POA: Diagnosis not present

## 2016-09-29 DIAGNOSIS — F419 Anxiety disorder, unspecified: Secondary | ICD-10-CM | POA: Diagnosis not present

## 2016-09-29 DIAGNOSIS — I10 Essential (primary) hypertension: Secondary | ICD-10-CM | POA: Diagnosis not present

## 2016-09-29 DIAGNOSIS — M810 Age-related osteoporosis without current pathological fracture: Secondary | ICD-10-CM | POA: Diagnosis not present

## 2016-09-30 DIAGNOSIS — M810 Age-related osteoporosis without current pathological fracture: Secondary | ICD-10-CM | POA: Diagnosis not present

## 2016-09-30 DIAGNOSIS — I10 Essential (primary) hypertension: Secondary | ICD-10-CM | POA: Diagnosis not present

## 2016-09-30 DIAGNOSIS — R412 Retrograde amnesia: Secondary | ICD-10-CM | POA: Diagnosis not present

## 2016-09-30 DIAGNOSIS — F419 Anxiety disorder, unspecified: Secondary | ICD-10-CM | POA: Diagnosis not present

## 2016-10-01 DIAGNOSIS — M810 Age-related osteoporosis without current pathological fracture: Secondary | ICD-10-CM | POA: Diagnosis not present

## 2016-10-01 DIAGNOSIS — R412 Retrograde amnesia: Secondary | ICD-10-CM | POA: Diagnosis not present

## 2016-10-01 DIAGNOSIS — I10 Essential (primary) hypertension: Secondary | ICD-10-CM | POA: Diagnosis not present

## 2016-10-01 DIAGNOSIS — F419 Anxiety disorder, unspecified: Secondary | ICD-10-CM | POA: Diagnosis not present

## 2016-10-02 DIAGNOSIS — I10 Essential (primary) hypertension: Secondary | ICD-10-CM | POA: Diagnosis not present

## 2016-10-02 DIAGNOSIS — F419 Anxiety disorder, unspecified: Secondary | ICD-10-CM | POA: Diagnosis not present

## 2016-10-02 DIAGNOSIS — M810 Age-related osteoporosis without current pathological fracture: Secondary | ICD-10-CM | POA: Diagnosis not present

## 2016-10-02 DIAGNOSIS — R412 Retrograde amnesia: Secondary | ICD-10-CM | POA: Diagnosis not present

## 2016-10-03 DIAGNOSIS — M810 Age-related osteoporosis without current pathological fracture: Secondary | ICD-10-CM | POA: Diagnosis not present

## 2016-10-03 DIAGNOSIS — F419 Anxiety disorder, unspecified: Secondary | ICD-10-CM | POA: Diagnosis not present

## 2016-10-03 DIAGNOSIS — R412 Retrograde amnesia: Secondary | ICD-10-CM | POA: Diagnosis not present

## 2016-10-03 DIAGNOSIS — I10 Essential (primary) hypertension: Secondary | ICD-10-CM | POA: Diagnosis not present

## 2016-10-04 DIAGNOSIS — M810 Age-related osteoporosis without current pathological fracture: Secondary | ICD-10-CM | POA: Diagnosis not present

## 2016-10-04 DIAGNOSIS — R412 Retrograde amnesia: Secondary | ICD-10-CM | POA: Diagnosis not present

## 2016-10-04 DIAGNOSIS — F419 Anxiety disorder, unspecified: Secondary | ICD-10-CM | POA: Diagnosis not present

## 2016-10-04 DIAGNOSIS — I10 Essential (primary) hypertension: Secondary | ICD-10-CM | POA: Diagnosis not present

## 2016-10-05 DIAGNOSIS — F419 Anxiety disorder, unspecified: Secondary | ICD-10-CM | POA: Diagnosis not present

## 2016-10-05 DIAGNOSIS — R412 Retrograde amnesia: Secondary | ICD-10-CM | POA: Diagnosis not present

## 2016-10-05 DIAGNOSIS — I10 Essential (primary) hypertension: Secondary | ICD-10-CM | POA: Diagnosis not present

## 2016-10-05 DIAGNOSIS — M810 Age-related osteoporosis without current pathological fracture: Secondary | ICD-10-CM | POA: Diagnosis not present

## 2016-10-06 DIAGNOSIS — M810 Age-related osteoporosis without current pathological fracture: Secondary | ICD-10-CM | POA: Diagnosis not present

## 2016-10-06 DIAGNOSIS — R412 Retrograde amnesia: Secondary | ICD-10-CM | POA: Diagnosis not present

## 2016-10-06 DIAGNOSIS — F419 Anxiety disorder, unspecified: Secondary | ICD-10-CM | POA: Diagnosis not present

## 2016-10-06 DIAGNOSIS — I10 Essential (primary) hypertension: Secondary | ICD-10-CM | POA: Diagnosis not present

## 2016-10-07 DIAGNOSIS — M810 Age-related osteoporosis without current pathological fracture: Secondary | ICD-10-CM | POA: Diagnosis not present

## 2016-10-07 DIAGNOSIS — R412 Retrograde amnesia: Secondary | ICD-10-CM | POA: Diagnosis not present

## 2016-10-07 DIAGNOSIS — F419 Anxiety disorder, unspecified: Secondary | ICD-10-CM | POA: Diagnosis not present

## 2016-10-07 DIAGNOSIS — I10 Essential (primary) hypertension: Secondary | ICD-10-CM | POA: Diagnosis not present

## 2016-10-08 DIAGNOSIS — F419 Anxiety disorder, unspecified: Secondary | ICD-10-CM | POA: Diagnosis not present

## 2016-10-08 DIAGNOSIS — R412 Retrograde amnesia: Secondary | ICD-10-CM | POA: Diagnosis not present

## 2016-10-08 DIAGNOSIS — I10 Essential (primary) hypertension: Secondary | ICD-10-CM | POA: Diagnosis not present

## 2016-10-08 DIAGNOSIS — M810 Age-related osteoporosis without current pathological fracture: Secondary | ICD-10-CM | POA: Diagnosis not present

## 2016-10-09 DIAGNOSIS — M810 Age-related osteoporosis without current pathological fracture: Secondary | ICD-10-CM | POA: Diagnosis not present

## 2016-10-09 DIAGNOSIS — R412 Retrograde amnesia: Secondary | ICD-10-CM | POA: Diagnosis not present

## 2016-10-09 DIAGNOSIS — F419 Anxiety disorder, unspecified: Secondary | ICD-10-CM | POA: Diagnosis not present

## 2016-10-09 DIAGNOSIS — I10 Essential (primary) hypertension: Secondary | ICD-10-CM | POA: Diagnosis not present

## 2016-10-10 DIAGNOSIS — M810 Age-related osteoporosis without current pathological fracture: Secondary | ICD-10-CM | POA: Diagnosis not present

## 2016-10-10 DIAGNOSIS — R412 Retrograde amnesia: Secondary | ICD-10-CM | POA: Diagnosis not present

## 2016-10-10 DIAGNOSIS — I10 Essential (primary) hypertension: Secondary | ICD-10-CM | POA: Diagnosis not present

## 2016-10-10 DIAGNOSIS — F419 Anxiety disorder, unspecified: Secondary | ICD-10-CM | POA: Diagnosis not present

## 2016-10-11 DIAGNOSIS — F419 Anxiety disorder, unspecified: Secondary | ICD-10-CM | POA: Diagnosis not present

## 2016-10-11 DIAGNOSIS — R412 Retrograde amnesia: Secondary | ICD-10-CM | POA: Diagnosis not present

## 2016-10-11 DIAGNOSIS — M810 Age-related osteoporosis without current pathological fracture: Secondary | ICD-10-CM | POA: Diagnosis not present

## 2016-10-11 DIAGNOSIS — I10 Essential (primary) hypertension: Secondary | ICD-10-CM | POA: Diagnosis not present

## 2016-10-12 DIAGNOSIS — R412 Retrograde amnesia: Secondary | ICD-10-CM | POA: Diagnosis not present

## 2016-10-12 DIAGNOSIS — F419 Anxiety disorder, unspecified: Secondary | ICD-10-CM | POA: Diagnosis not present

## 2016-10-12 DIAGNOSIS — M810 Age-related osteoporosis without current pathological fracture: Secondary | ICD-10-CM | POA: Diagnosis not present

## 2016-10-12 DIAGNOSIS — I10 Essential (primary) hypertension: Secondary | ICD-10-CM | POA: Diagnosis not present

## 2016-10-13 DIAGNOSIS — R412 Retrograde amnesia: Secondary | ICD-10-CM | POA: Diagnosis not present

## 2016-10-13 DIAGNOSIS — F419 Anxiety disorder, unspecified: Secondary | ICD-10-CM | POA: Diagnosis not present

## 2016-10-13 DIAGNOSIS — M810 Age-related osteoporosis without current pathological fracture: Secondary | ICD-10-CM | POA: Diagnosis not present

## 2016-10-13 DIAGNOSIS — I10 Essential (primary) hypertension: Secondary | ICD-10-CM | POA: Diagnosis not present

## 2016-10-14 DIAGNOSIS — I10 Essential (primary) hypertension: Secondary | ICD-10-CM | POA: Diagnosis not present

## 2016-10-14 DIAGNOSIS — M810 Age-related osteoporosis without current pathological fracture: Secondary | ICD-10-CM | POA: Diagnosis not present

## 2016-10-14 DIAGNOSIS — F419 Anxiety disorder, unspecified: Secondary | ICD-10-CM | POA: Diagnosis not present

## 2016-10-14 DIAGNOSIS — R412 Retrograde amnesia: Secondary | ICD-10-CM | POA: Diagnosis not present

## 2016-10-15 DIAGNOSIS — F419 Anxiety disorder, unspecified: Secondary | ICD-10-CM | POA: Diagnosis not present

## 2016-10-15 DIAGNOSIS — I10 Essential (primary) hypertension: Secondary | ICD-10-CM | POA: Diagnosis not present

## 2016-10-15 DIAGNOSIS — R412 Retrograde amnesia: Secondary | ICD-10-CM | POA: Diagnosis not present

## 2016-10-15 DIAGNOSIS — M810 Age-related osteoporosis without current pathological fracture: Secondary | ICD-10-CM | POA: Diagnosis not present

## 2016-10-16 DIAGNOSIS — R412 Retrograde amnesia: Secondary | ICD-10-CM | POA: Diagnosis not present

## 2016-10-16 DIAGNOSIS — I10 Essential (primary) hypertension: Secondary | ICD-10-CM | POA: Diagnosis not present

## 2016-10-16 DIAGNOSIS — F419 Anxiety disorder, unspecified: Secondary | ICD-10-CM | POA: Diagnosis not present

## 2016-10-16 DIAGNOSIS — M810 Age-related osteoporosis without current pathological fracture: Secondary | ICD-10-CM | POA: Diagnosis not present

## 2016-10-17 DIAGNOSIS — I10 Essential (primary) hypertension: Secondary | ICD-10-CM | POA: Diagnosis not present

## 2016-10-17 DIAGNOSIS — M810 Age-related osteoporosis without current pathological fracture: Secondary | ICD-10-CM | POA: Diagnosis not present

## 2016-10-17 DIAGNOSIS — F419 Anxiety disorder, unspecified: Secondary | ICD-10-CM | POA: Diagnosis not present

## 2016-10-17 DIAGNOSIS — R412 Retrograde amnesia: Secondary | ICD-10-CM | POA: Diagnosis not present

## 2016-10-18 DIAGNOSIS — R412 Retrograde amnesia: Secondary | ICD-10-CM | POA: Diagnosis not present

## 2016-10-18 DIAGNOSIS — M810 Age-related osteoporosis without current pathological fracture: Secondary | ICD-10-CM | POA: Diagnosis not present

## 2016-10-18 DIAGNOSIS — F419 Anxiety disorder, unspecified: Secondary | ICD-10-CM | POA: Diagnosis not present

## 2016-10-18 DIAGNOSIS — I10 Essential (primary) hypertension: Secondary | ICD-10-CM | POA: Diagnosis not present

## 2016-10-19 DIAGNOSIS — F419 Anxiety disorder, unspecified: Secondary | ICD-10-CM | POA: Diagnosis not present

## 2016-10-19 DIAGNOSIS — M810 Age-related osteoporosis without current pathological fracture: Secondary | ICD-10-CM | POA: Diagnosis not present

## 2016-10-19 DIAGNOSIS — R412 Retrograde amnesia: Secondary | ICD-10-CM | POA: Diagnosis not present

## 2016-10-19 DIAGNOSIS — I10 Essential (primary) hypertension: Secondary | ICD-10-CM | POA: Diagnosis not present

## 2016-10-20 DIAGNOSIS — I10 Essential (primary) hypertension: Secondary | ICD-10-CM | POA: Diagnosis not present

## 2016-10-20 DIAGNOSIS — R412 Retrograde amnesia: Secondary | ICD-10-CM | POA: Diagnosis not present

## 2016-10-20 DIAGNOSIS — M810 Age-related osteoporosis without current pathological fracture: Secondary | ICD-10-CM | POA: Diagnosis not present

## 2016-10-20 DIAGNOSIS — F419 Anxiety disorder, unspecified: Secondary | ICD-10-CM | POA: Diagnosis not present

## 2016-10-21 DIAGNOSIS — M810 Age-related osteoporosis without current pathological fracture: Secondary | ICD-10-CM | POA: Diagnosis not present

## 2016-10-21 DIAGNOSIS — F419 Anxiety disorder, unspecified: Secondary | ICD-10-CM | POA: Diagnosis not present

## 2016-10-21 DIAGNOSIS — R412 Retrograde amnesia: Secondary | ICD-10-CM | POA: Diagnosis not present

## 2016-10-21 DIAGNOSIS — I10 Essential (primary) hypertension: Secondary | ICD-10-CM | POA: Diagnosis not present

## 2016-10-22 DIAGNOSIS — I10 Essential (primary) hypertension: Secondary | ICD-10-CM | POA: Diagnosis not present

## 2016-10-22 DIAGNOSIS — M810 Age-related osteoporosis without current pathological fracture: Secondary | ICD-10-CM | POA: Diagnosis not present

## 2016-10-22 DIAGNOSIS — R412 Retrograde amnesia: Secondary | ICD-10-CM | POA: Diagnosis not present

## 2016-10-22 DIAGNOSIS — F419 Anxiety disorder, unspecified: Secondary | ICD-10-CM | POA: Diagnosis not present

## 2016-10-23 DIAGNOSIS — R412 Retrograde amnesia: Secondary | ICD-10-CM | POA: Diagnosis not present

## 2016-10-23 DIAGNOSIS — F419 Anxiety disorder, unspecified: Secondary | ICD-10-CM | POA: Diagnosis not present

## 2016-10-23 DIAGNOSIS — M810 Age-related osteoporosis without current pathological fracture: Secondary | ICD-10-CM | POA: Diagnosis not present

## 2016-10-23 DIAGNOSIS — I10 Essential (primary) hypertension: Secondary | ICD-10-CM | POA: Diagnosis not present

## 2016-10-24 DIAGNOSIS — I10 Essential (primary) hypertension: Secondary | ICD-10-CM | POA: Diagnosis not present

## 2016-10-24 DIAGNOSIS — M810 Age-related osteoporosis without current pathological fracture: Secondary | ICD-10-CM | POA: Diagnosis not present

## 2016-10-24 DIAGNOSIS — R412 Retrograde amnesia: Secondary | ICD-10-CM | POA: Diagnosis not present

## 2016-10-24 DIAGNOSIS — F419 Anxiety disorder, unspecified: Secondary | ICD-10-CM | POA: Diagnosis not present

## 2016-10-25 DIAGNOSIS — F419 Anxiety disorder, unspecified: Secondary | ICD-10-CM | POA: Diagnosis not present

## 2016-10-25 DIAGNOSIS — R412 Retrograde amnesia: Secondary | ICD-10-CM | POA: Diagnosis not present

## 2016-10-25 DIAGNOSIS — I10 Essential (primary) hypertension: Secondary | ICD-10-CM | POA: Diagnosis not present

## 2016-10-25 DIAGNOSIS — M810 Age-related osteoporosis without current pathological fracture: Secondary | ICD-10-CM | POA: Diagnosis not present

## 2016-10-26 DIAGNOSIS — M810 Age-related osteoporosis without current pathological fracture: Secondary | ICD-10-CM | POA: Diagnosis not present

## 2016-10-26 DIAGNOSIS — F419 Anxiety disorder, unspecified: Secondary | ICD-10-CM | POA: Diagnosis not present

## 2016-10-26 DIAGNOSIS — I10 Essential (primary) hypertension: Secondary | ICD-10-CM | POA: Diagnosis not present

## 2016-10-26 DIAGNOSIS — R412 Retrograde amnesia: Secondary | ICD-10-CM | POA: Diagnosis not present

## 2016-10-27 DIAGNOSIS — R412 Retrograde amnesia: Secondary | ICD-10-CM | POA: Diagnosis not present

## 2016-10-27 DIAGNOSIS — F419 Anxiety disorder, unspecified: Secondary | ICD-10-CM | POA: Diagnosis not present

## 2016-10-27 DIAGNOSIS — M810 Age-related osteoporosis without current pathological fracture: Secondary | ICD-10-CM | POA: Diagnosis not present

## 2016-10-27 DIAGNOSIS — I10 Essential (primary) hypertension: Secondary | ICD-10-CM | POA: Diagnosis not present

## 2016-10-28 DIAGNOSIS — F419 Anxiety disorder, unspecified: Secondary | ICD-10-CM | POA: Diagnosis not present

## 2016-10-28 DIAGNOSIS — I10 Essential (primary) hypertension: Secondary | ICD-10-CM | POA: Diagnosis not present

## 2016-10-28 DIAGNOSIS — R412 Retrograde amnesia: Secondary | ICD-10-CM | POA: Diagnosis not present

## 2016-10-28 DIAGNOSIS — M810 Age-related osteoporosis without current pathological fracture: Secondary | ICD-10-CM | POA: Diagnosis not present

## 2016-10-29 DIAGNOSIS — I10 Essential (primary) hypertension: Secondary | ICD-10-CM | POA: Diagnosis not present

## 2016-10-29 DIAGNOSIS — F419 Anxiety disorder, unspecified: Secondary | ICD-10-CM | POA: Diagnosis not present

## 2016-10-29 DIAGNOSIS — M810 Age-related osteoporosis without current pathological fracture: Secondary | ICD-10-CM | POA: Diagnosis not present

## 2016-10-29 DIAGNOSIS — R412 Retrograde amnesia: Secondary | ICD-10-CM | POA: Diagnosis not present

## 2016-10-30 DIAGNOSIS — F419 Anxiety disorder, unspecified: Secondary | ICD-10-CM | POA: Diagnosis not present

## 2016-10-30 DIAGNOSIS — R412 Retrograde amnesia: Secondary | ICD-10-CM | POA: Diagnosis not present

## 2016-10-30 DIAGNOSIS — M810 Age-related osteoporosis without current pathological fracture: Secondary | ICD-10-CM | POA: Diagnosis not present

## 2016-10-30 DIAGNOSIS — I10 Essential (primary) hypertension: Secondary | ICD-10-CM | POA: Diagnosis not present

## 2016-10-31 DIAGNOSIS — R412 Retrograde amnesia: Secondary | ICD-10-CM | POA: Diagnosis not present

## 2016-10-31 DIAGNOSIS — M810 Age-related osteoporosis without current pathological fracture: Secondary | ICD-10-CM | POA: Diagnosis not present

## 2016-10-31 DIAGNOSIS — F419 Anxiety disorder, unspecified: Secondary | ICD-10-CM | POA: Diagnosis not present

## 2016-10-31 DIAGNOSIS — I10 Essential (primary) hypertension: Secondary | ICD-10-CM | POA: Diagnosis not present

## 2016-11-01 DIAGNOSIS — I10 Essential (primary) hypertension: Secondary | ICD-10-CM | POA: Diagnosis not present

## 2016-11-01 DIAGNOSIS — R412 Retrograde amnesia: Secondary | ICD-10-CM | POA: Diagnosis not present

## 2016-11-01 DIAGNOSIS — F419 Anxiety disorder, unspecified: Secondary | ICD-10-CM | POA: Diagnosis not present

## 2016-11-01 DIAGNOSIS — M810 Age-related osteoporosis without current pathological fracture: Secondary | ICD-10-CM | POA: Diagnosis not present

## 2016-11-02 DIAGNOSIS — F419 Anxiety disorder, unspecified: Secondary | ICD-10-CM | POA: Diagnosis not present

## 2016-11-02 DIAGNOSIS — R412 Retrograde amnesia: Secondary | ICD-10-CM | POA: Diagnosis not present

## 2016-11-02 DIAGNOSIS — I10 Essential (primary) hypertension: Secondary | ICD-10-CM | POA: Diagnosis not present

## 2016-11-02 DIAGNOSIS — M810 Age-related osteoporosis without current pathological fracture: Secondary | ICD-10-CM | POA: Diagnosis not present

## 2016-11-04 DIAGNOSIS — F419 Anxiety disorder, unspecified: Secondary | ICD-10-CM | POA: Diagnosis not present

## 2016-11-04 DIAGNOSIS — M810 Age-related osteoporosis without current pathological fracture: Secondary | ICD-10-CM | POA: Diagnosis not present

## 2016-11-04 DIAGNOSIS — R412 Retrograde amnesia: Secondary | ICD-10-CM | POA: Diagnosis not present

## 2016-11-04 DIAGNOSIS — I10 Essential (primary) hypertension: Secondary | ICD-10-CM | POA: Diagnosis not present

## 2016-11-05 DIAGNOSIS — I10 Essential (primary) hypertension: Secondary | ICD-10-CM | POA: Diagnosis not present

## 2016-11-05 DIAGNOSIS — F419 Anxiety disorder, unspecified: Secondary | ICD-10-CM | POA: Diagnosis not present

## 2016-11-05 DIAGNOSIS — R412 Retrograde amnesia: Secondary | ICD-10-CM | POA: Diagnosis not present

## 2016-11-05 DIAGNOSIS — M810 Age-related osteoporosis without current pathological fracture: Secondary | ICD-10-CM | POA: Diagnosis not present

## 2016-11-06 DIAGNOSIS — M810 Age-related osteoporosis without current pathological fracture: Secondary | ICD-10-CM | POA: Diagnosis not present

## 2016-11-06 DIAGNOSIS — F419 Anxiety disorder, unspecified: Secondary | ICD-10-CM | POA: Diagnosis not present

## 2016-11-06 DIAGNOSIS — R412 Retrograde amnesia: Secondary | ICD-10-CM | POA: Diagnosis not present

## 2016-11-06 DIAGNOSIS — I10 Essential (primary) hypertension: Secondary | ICD-10-CM | POA: Diagnosis not present

## 2016-11-07 DIAGNOSIS — I10 Essential (primary) hypertension: Secondary | ICD-10-CM | POA: Diagnosis not present

## 2016-11-07 DIAGNOSIS — R412 Retrograde amnesia: Secondary | ICD-10-CM | POA: Diagnosis not present

## 2016-11-07 DIAGNOSIS — F419 Anxiety disorder, unspecified: Secondary | ICD-10-CM | POA: Diagnosis not present

## 2016-11-07 DIAGNOSIS — M810 Age-related osteoporosis without current pathological fracture: Secondary | ICD-10-CM | POA: Diagnosis not present

## 2016-11-08 DIAGNOSIS — M810 Age-related osteoporosis without current pathological fracture: Secondary | ICD-10-CM | POA: Diagnosis not present

## 2016-11-08 DIAGNOSIS — I10 Essential (primary) hypertension: Secondary | ICD-10-CM | POA: Diagnosis not present

## 2016-11-08 DIAGNOSIS — R412 Retrograde amnesia: Secondary | ICD-10-CM | POA: Diagnosis not present

## 2016-11-08 DIAGNOSIS — F419 Anxiety disorder, unspecified: Secondary | ICD-10-CM | POA: Diagnosis not present

## 2016-11-09 DIAGNOSIS — R412 Retrograde amnesia: Secondary | ICD-10-CM | POA: Diagnosis not present

## 2016-11-09 DIAGNOSIS — M810 Age-related osteoporosis without current pathological fracture: Secondary | ICD-10-CM | POA: Diagnosis not present

## 2016-11-09 DIAGNOSIS — I10 Essential (primary) hypertension: Secondary | ICD-10-CM | POA: Diagnosis not present

## 2016-11-09 DIAGNOSIS — F419 Anxiety disorder, unspecified: Secondary | ICD-10-CM | POA: Diagnosis not present

## 2016-11-10 DIAGNOSIS — M810 Age-related osteoporosis without current pathological fracture: Secondary | ICD-10-CM | POA: Diagnosis not present

## 2016-11-10 DIAGNOSIS — I10 Essential (primary) hypertension: Secondary | ICD-10-CM | POA: Diagnosis not present

## 2016-11-10 DIAGNOSIS — F419 Anxiety disorder, unspecified: Secondary | ICD-10-CM | POA: Diagnosis not present

## 2016-11-10 DIAGNOSIS — R412 Retrograde amnesia: Secondary | ICD-10-CM | POA: Diagnosis not present

## 2016-11-11 DIAGNOSIS — R412 Retrograde amnesia: Secondary | ICD-10-CM | POA: Diagnosis not present

## 2016-11-11 DIAGNOSIS — F419 Anxiety disorder, unspecified: Secondary | ICD-10-CM | POA: Diagnosis not present

## 2016-11-11 DIAGNOSIS — M810 Age-related osteoporosis without current pathological fracture: Secondary | ICD-10-CM | POA: Diagnosis not present

## 2016-11-11 DIAGNOSIS — I10 Essential (primary) hypertension: Secondary | ICD-10-CM | POA: Diagnosis not present

## 2016-11-12 DIAGNOSIS — R412 Retrograde amnesia: Secondary | ICD-10-CM | POA: Diagnosis not present

## 2016-11-12 DIAGNOSIS — I10 Essential (primary) hypertension: Secondary | ICD-10-CM | POA: Diagnosis not present

## 2016-11-12 DIAGNOSIS — M810 Age-related osteoporosis without current pathological fracture: Secondary | ICD-10-CM | POA: Diagnosis not present

## 2016-11-12 DIAGNOSIS — F419 Anxiety disorder, unspecified: Secondary | ICD-10-CM | POA: Diagnosis not present

## 2016-11-13 DIAGNOSIS — M810 Age-related osteoporosis without current pathological fracture: Secondary | ICD-10-CM | POA: Diagnosis not present

## 2016-11-13 DIAGNOSIS — R412 Retrograde amnesia: Secondary | ICD-10-CM | POA: Diagnosis not present

## 2016-11-13 DIAGNOSIS — I10 Essential (primary) hypertension: Secondary | ICD-10-CM | POA: Diagnosis not present

## 2016-11-13 DIAGNOSIS — F419 Anxiety disorder, unspecified: Secondary | ICD-10-CM | POA: Diagnosis not present

## 2016-11-14 DIAGNOSIS — F419 Anxiety disorder, unspecified: Secondary | ICD-10-CM | POA: Diagnosis not present

## 2016-11-14 DIAGNOSIS — M810 Age-related osteoporosis without current pathological fracture: Secondary | ICD-10-CM | POA: Diagnosis not present

## 2016-11-14 DIAGNOSIS — R412 Retrograde amnesia: Secondary | ICD-10-CM | POA: Diagnosis not present

## 2016-11-14 DIAGNOSIS — I10 Essential (primary) hypertension: Secondary | ICD-10-CM | POA: Diagnosis not present

## 2016-11-15 DIAGNOSIS — M810 Age-related osteoporosis without current pathological fracture: Secondary | ICD-10-CM | POA: Diagnosis not present

## 2016-11-15 DIAGNOSIS — I10 Essential (primary) hypertension: Secondary | ICD-10-CM | POA: Diagnosis not present

## 2016-11-15 DIAGNOSIS — F419 Anxiety disorder, unspecified: Secondary | ICD-10-CM | POA: Diagnosis not present

## 2016-11-15 DIAGNOSIS — R412 Retrograde amnesia: Secondary | ICD-10-CM | POA: Diagnosis not present

## 2016-11-16 DIAGNOSIS — I10 Essential (primary) hypertension: Secondary | ICD-10-CM | POA: Diagnosis not present

## 2016-11-16 DIAGNOSIS — M810 Age-related osteoporosis without current pathological fracture: Secondary | ICD-10-CM | POA: Diagnosis not present

## 2016-11-16 DIAGNOSIS — R412 Retrograde amnesia: Secondary | ICD-10-CM | POA: Diagnosis not present

## 2016-11-16 DIAGNOSIS — F419 Anxiety disorder, unspecified: Secondary | ICD-10-CM | POA: Diagnosis not present

## 2016-11-17 DIAGNOSIS — F419 Anxiety disorder, unspecified: Secondary | ICD-10-CM | POA: Diagnosis not present

## 2016-11-17 DIAGNOSIS — I10 Essential (primary) hypertension: Secondary | ICD-10-CM | POA: Diagnosis not present

## 2016-11-17 DIAGNOSIS — M810 Age-related osteoporosis without current pathological fracture: Secondary | ICD-10-CM | POA: Diagnosis not present

## 2016-11-17 DIAGNOSIS — R412 Retrograde amnesia: Secondary | ICD-10-CM | POA: Diagnosis not present

## 2016-11-18 DIAGNOSIS — F419 Anxiety disorder, unspecified: Secondary | ICD-10-CM | POA: Diagnosis not present

## 2016-11-18 DIAGNOSIS — I10 Essential (primary) hypertension: Secondary | ICD-10-CM | POA: Diagnosis not present

## 2016-11-18 DIAGNOSIS — M810 Age-related osteoporosis without current pathological fracture: Secondary | ICD-10-CM | POA: Diagnosis not present

## 2016-11-18 DIAGNOSIS — R412 Retrograde amnesia: Secondary | ICD-10-CM | POA: Diagnosis not present

## 2016-11-19 DIAGNOSIS — F419 Anxiety disorder, unspecified: Secondary | ICD-10-CM | POA: Diagnosis not present

## 2016-11-19 DIAGNOSIS — I10 Essential (primary) hypertension: Secondary | ICD-10-CM | POA: Diagnosis not present

## 2016-11-19 DIAGNOSIS — M810 Age-related osteoporosis without current pathological fracture: Secondary | ICD-10-CM | POA: Diagnosis not present

## 2016-11-19 DIAGNOSIS — R412 Retrograde amnesia: Secondary | ICD-10-CM | POA: Diagnosis not present

## 2016-11-20 DIAGNOSIS — M810 Age-related osteoporosis without current pathological fracture: Secondary | ICD-10-CM | POA: Diagnosis not present

## 2016-11-20 DIAGNOSIS — F419 Anxiety disorder, unspecified: Secondary | ICD-10-CM | POA: Diagnosis not present

## 2016-11-20 DIAGNOSIS — R412 Retrograde amnesia: Secondary | ICD-10-CM | POA: Diagnosis not present

## 2016-11-20 DIAGNOSIS — I10 Essential (primary) hypertension: Secondary | ICD-10-CM | POA: Diagnosis not present

## 2016-11-21 DIAGNOSIS — R412 Retrograde amnesia: Secondary | ICD-10-CM | POA: Diagnosis not present

## 2016-11-21 DIAGNOSIS — M810 Age-related osteoporosis without current pathological fracture: Secondary | ICD-10-CM | POA: Diagnosis not present

## 2016-11-21 DIAGNOSIS — F419 Anxiety disorder, unspecified: Secondary | ICD-10-CM | POA: Diagnosis not present

## 2016-11-21 DIAGNOSIS — I10 Essential (primary) hypertension: Secondary | ICD-10-CM | POA: Diagnosis not present

## 2016-11-22 DIAGNOSIS — R412 Retrograde amnesia: Secondary | ICD-10-CM | POA: Diagnosis not present

## 2016-11-22 DIAGNOSIS — I10 Essential (primary) hypertension: Secondary | ICD-10-CM | POA: Diagnosis not present

## 2016-11-22 DIAGNOSIS — M810 Age-related osteoporosis without current pathological fracture: Secondary | ICD-10-CM | POA: Diagnosis not present

## 2016-11-22 DIAGNOSIS — F419 Anxiety disorder, unspecified: Secondary | ICD-10-CM | POA: Diagnosis not present

## 2016-11-23 DIAGNOSIS — M810 Age-related osteoporosis without current pathological fracture: Secondary | ICD-10-CM | POA: Diagnosis not present

## 2016-11-23 DIAGNOSIS — I10 Essential (primary) hypertension: Secondary | ICD-10-CM | POA: Diagnosis not present

## 2016-11-23 DIAGNOSIS — F419 Anxiety disorder, unspecified: Secondary | ICD-10-CM | POA: Diagnosis not present

## 2016-11-23 DIAGNOSIS — R412 Retrograde amnesia: Secondary | ICD-10-CM | POA: Diagnosis not present

## 2016-11-24 DIAGNOSIS — R412 Retrograde amnesia: Secondary | ICD-10-CM | POA: Diagnosis not present

## 2016-11-24 DIAGNOSIS — F419 Anxiety disorder, unspecified: Secondary | ICD-10-CM | POA: Diagnosis not present

## 2016-11-24 DIAGNOSIS — I10 Essential (primary) hypertension: Secondary | ICD-10-CM | POA: Diagnosis not present

## 2016-11-24 DIAGNOSIS — M810 Age-related osteoporosis without current pathological fracture: Secondary | ICD-10-CM | POA: Diagnosis not present

## 2016-11-25 DIAGNOSIS — I10 Essential (primary) hypertension: Secondary | ICD-10-CM | POA: Diagnosis not present

## 2016-11-25 DIAGNOSIS — M810 Age-related osteoporosis without current pathological fracture: Secondary | ICD-10-CM | POA: Diagnosis not present

## 2016-11-25 DIAGNOSIS — F419 Anxiety disorder, unspecified: Secondary | ICD-10-CM | POA: Diagnosis not present

## 2016-11-25 DIAGNOSIS — R412 Retrograde amnesia: Secondary | ICD-10-CM | POA: Diagnosis not present

## 2016-11-26 DIAGNOSIS — F419 Anxiety disorder, unspecified: Secondary | ICD-10-CM | POA: Diagnosis not present

## 2016-11-26 DIAGNOSIS — I10 Essential (primary) hypertension: Secondary | ICD-10-CM | POA: Diagnosis not present

## 2016-11-26 DIAGNOSIS — M810 Age-related osteoporosis without current pathological fracture: Secondary | ICD-10-CM | POA: Diagnosis not present

## 2016-11-26 DIAGNOSIS — R412 Retrograde amnesia: Secondary | ICD-10-CM | POA: Diagnosis not present

## 2016-11-27 DIAGNOSIS — R412 Retrograde amnesia: Secondary | ICD-10-CM | POA: Diagnosis not present

## 2016-11-27 DIAGNOSIS — F419 Anxiety disorder, unspecified: Secondary | ICD-10-CM | POA: Diagnosis not present

## 2016-11-27 DIAGNOSIS — M810 Age-related osteoporosis without current pathological fracture: Secondary | ICD-10-CM | POA: Diagnosis not present

## 2016-11-27 DIAGNOSIS — I10 Essential (primary) hypertension: Secondary | ICD-10-CM | POA: Diagnosis not present

## 2016-11-28 DIAGNOSIS — M810 Age-related osteoporosis without current pathological fracture: Secondary | ICD-10-CM | POA: Diagnosis not present

## 2016-11-28 DIAGNOSIS — R412 Retrograde amnesia: Secondary | ICD-10-CM | POA: Diagnosis not present

## 2016-11-28 DIAGNOSIS — F419 Anxiety disorder, unspecified: Secondary | ICD-10-CM | POA: Diagnosis not present

## 2016-11-28 DIAGNOSIS — I10 Essential (primary) hypertension: Secondary | ICD-10-CM | POA: Diagnosis not present

## 2016-11-29 DIAGNOSIS — I10 Essential (primary) hypertension: Secondary | ICD-10-CM | POA: Diagnosis not present

## 2016-11-29 DIAGNOSIS — R412 Retrograde amnesia: Secondary | ICD-10-CM | POA: Diagnosis not present

## 2016-11-29 DIAGNOSIS — F419 Anxiety disorder, unspecified: Secondary | ICD-10-CM | POA: Diagnosis not present

## 2016-11-29 DIAGNOSIS — M810 Age-related osteoporosis without current pathological fracture: Secondary | ICD-10-CM | POA: Diagnosis not present

## 2016-11-30 DIAGNOSIS — M810 Age-related osteoporosis without current pathological fracture: Secondary | ICD-10-CM | POA: Diagnosis not present

## 2016-11-30 DIAGNOSIS — I10 Essential (primary) hypertension: Secondary | ICD-10-CM | POA: Diagnosis not present

## 2016-11-30 DIAGNOSIS — F419 Anxiety disorder, unspecified: Secondary | ICD-10-CM | POA: Diagnosis not present

## 2016-11-30 DIAGNOSIS — R412 Retrograde amnesia: Secondary | ICD-10-CM | POA: Diagnosis not present

## 2016-12-01 DIAGNOSIS — R412 Retrograde amnesia: Secondary | ICD-10-CM | POA: Diagnosis not present

## 2016-12-01 DIAGNOSIS — I10 Essential (primary) hypertension: Secondary | ICD-10-CM | POA: Diagnosis not present

## 2016-12-01 DIAGNOSIS — M810 Age-related osteoporosis without current pathological fracture: Secondary | ICD-10-CM | POA: Diagnosis not present

## 2016-12-01 DIAGNOSIS — F419 Anxiety disorder, unspecified: Secondary | ICD-10-CM | POA: Diagnosis not present

## 2016-12-02 DIAGNOSIS — I10 Essential (primary) hypertension: Secondary | ICD-10-CM | POA: Diagnosis not present

## 2016-12-02 DIAGNOSIS — F419 Anxiety disorder, unspecified: Secondary | ICD-10-CM | POA: Diagnosis not present

## 2016-12-02 DIAGNOSIS — M810 Age-related osteoporosis without current pathological fracture: Secondary | ICD-10-CM | POA: Diagnosis not present

## 2016-12-02 DIAGNOSIS — R412 Retrograde amnesia: Secondary | ICD-10-CM | POA: Diagnosis not present

## 2016-12-03 DIAGNOSIS — M810 Age-related osteoporosis without current pathological fracture: Secondary | ICD-10-CM | POA: Diagnosis not present

## 2016-12-03 DIAGNOSIS — I10 Essential (primary) hypertension: Secondary | ICD-10-CM | POA: Diagnosis not present

## 2016-12-03 DIAGNOSIS — R412 Retrograde amnesia: Secondary | ICD-10-CM | POA: Diagnosis not present

## 2016-12-03 DIAGNOSIS — F419 Anxiety disorder, unspecified: Secondary | ICD-10-CM | POA: Diagnosis not present

## 2016-12-04 DIAGNOSIS — R412 Retrograde amnesia: Secondary | ICD-10-CM | POA: Diagnosis not present

## 2016-12-04 DIAGNOSIS — I10 Essential (primary) hypertension: Secondary | ICD-10-CM | POA: Diagnosis not present

## 2016-12-04 DIAGNOSIS — F419 Anxiety disorder, unspecified: Secondary | ICD-10-CM | POA: Diagnosis not present

## 2016-12-04 DIAGNOSIS — M810 Age-related osteoporosis without current pathological fracture: Secondary | ICD-10-CM | POA: Diagnosis not present

## 2016-12-05 DIAGNOSIS — F419 Anxiety disorder, unspecified: Secondary | ICD-10-CM | POA: Diagnosis not present

## 2016-12-05 DIAGNOSIS — R412 Retrograde amnesia: Secondary | ICD-10-CM | POA: Diagnosis not present

## 2016-12-05 DIAGNOSIS — M810 Age-related osteoporosis without current pathological fracture: Secondary | ICD-10-CM | POA: Diagnosis not present

## 2016-12-05 DIAGNOSIS — I10 Essential (primary) hypertension: Secondary | ICD-10-CM | POA: Diagnosis not present

## 2016-12-06 DIAGNOSIS — M810 Age-related osteoporosis without current pathological fracture: Secondary | ICD-10-CM | POA: Diagnosis not present

## 2016-12-06 DIAGNOSIS — I10 Essential (primary) hypertension: Secondary | ICD-10-CM | POA: Diagnosis not present

## 2016-12-06 DIAGNOSIS — F419 Anxiety disorder, unspecified: Secondary | ICD-10-CM | POA: Diagnosis not present

## 2016-12-06 DIAGNOSIS — R412 Retrograde amnesia: Secondary | ICD-10-CM | POA: Diagnosis not present

## 2016-12-07 DIAGNOSIS — I10 Essential (primary) hypertension: Secondary | ICD-10-CM | POA: Diagnosis not present

## 2016-12-07 DIAGNOSIS — M810 Age-related osteoporosis without current pathological fracture: Secondary | ICD-10-CM | POA: Diagnosis not present

## 2016-12-07 DIAGNOSIS — F419 Anxiety disorder, unspecified: Secondary | ICD-10-CM | POA: Diagnosis not present

## 2016-12-07 DIAGNOSIS — R412 Retrograde amnesia: Secondary | ICD-10-CM | POA: Diagnosis not present

## 2016-12-08 DIAGNOSIS — R412 Retrograde amnesia: Secondary | ICD-10-CM | POA: Diagnosis not present

## 2016-12-08 DIAGNOSIS — F419 Anxiety disorder, unspecified: Secondary | ICD-10-CM | POA: Diagnosis not present

## 2016-12-08 DIAGNOSIS — M810 Age-related osteoporosis without current pathological fracture: Secondary | ICD-10-CM | POA: Diagnosis not present

## 2016-12-08 DIAGNOSIS — I10 Essential (primary) hypertension: Secondary | ICD-10-CM | POA: Diagnosis not present

## 2016-12-09 DIAGNOSIS — I10 Essential (primary) hypertension: Secondary | ICD-10-CM | POA: Diagnosis not present

## 2016-12-09 DIAGNOSIS — Z0000 Encounter for general adult medical examination without abnormal findings: Secondary | ICD-10-CM | POA: Diagnosis not present

## 2016-12-09 DIAGNOSIS — R412 Retrograde amnesia: Secondary | ICD-10-CM | POA: Diagnosis not present

## 2016-12-09 DIAGNOSIS — Z6825 Body mass index (BMI) 25.0-25.9, adult: Secondary | ICD-10-CM | POA: Diagnosis not present

## 2016-12-09 DIAGNOSIS — K219 Gastro-esophageal reflux disease without esophagitis: Secondary | ICD-10-CM | POA: Diagnosis not present

## 2016-12-09 DIAGNOSIS — F419 Anxiety disorder, unspecified: Secondary | ICD-10-CM | POA: Diagnosis not present

## 2016-12-09 DIAGNOSIS — M810 Age-related osteoporosis without current pathological fracture: Secondary | ICD-10-CM | POA: Diagnosis not present

## 2016-12-10 DIAGNOSIS — I10 Essential (primary) hypertension: Secondary | ICD-10-CM | POA: Diagnosis not present

## 2016-12-10 DIAGNOSIS — R412 Retrograde amnesia: Secondary | ICD-10-CM | POA: Diagnosis not present

## 2016-12-10 DIAGNOSIS — F419 Anxiety disorder, unspecified: Secondary | ICD-10-CM | POA: Diagnosis not present

## 2016-12-10 DIAGNOSIS — M810 Age-related osteoporosis without current pathological fracture: Secondary | ICD-10-CM | POA: Diagnosis not present

## 2016-12-11 DIAGNOSIS — F419 Anxiety disorder, unspecified: Secondary | ICD-10-CM | POA: Diagnosis not present

## 2016-12-11 DIAGNOSIS — I10 Essential (primary) hypertension: Secondary | ICD-10-CM | POA: Diagnosis not present

## 2016-12-11 DIAGNOSIS — R412 Retrograde amnesia: Secondary | ICD-10-CM | POA: Diagnosis not present

## 2016-12-11 DIAGNOSIS — M810 Age-related osteoporosis without current pathological fracture: Secondary | ICD-10-CM | POA: Diagnosis not present

## 2016-12-12 DIAGNOSIS — R412 Retrograde amnesia: Secondary | ICD-10-CM | POA: Diagnosis not present

## 2016-12-12 DIAGNOSIS — M810 Age-related osteoporosis without current pathological fracture: Secondary | ICD-10-CM | POA: Diagnosis not present

## 2016-12-12 DIAGNOSIS — F419 Anxiety disorder, unspecified: Secondary | ICD-10-CM | POA: Diagnosis not present

## 2016-12-12 DIAGNOSIS — I10 Essential (primary) hypertension: Secondary | ICD-10-CM | POA: Diagnosis not present

## 2016-12-13 DIAGNOSIS — I10 Essential (primary) hypertension: Secondary | ICD-10-CM | POA: Diagnosis not present

## 2016-12-13 DIAGNOSIS — M810 Age-related osteoporosis without current pathological fracture: Secondary | ICD-10-CM | POA: Diagnosis not present

## 2016-12-13 DIAGNOSIS — F419 Anxiety disorder, unspecified: Secondary | ICD-10-CM | POA: Diagnosis not present

## 2016-12-13 DIAGNOSIS — R412 Retrograde amnesia: Secondary | ICD-10-CM | POA: Diagnosis not present

## 2016-12-14 DIAGNOSIS — I10 Essential (primary) hypertension: Secondary | ICD-10-CM | POA: Diagnosis not present

## 2016-12-14 DIAGNOSIS — R412 Retrograde amnesia: Secondary | ICD-10-CM | POA: Diagnosis not present

## 2016-12-14 DIAGNOSIS — F419 Anxiety disorder, unspecified: Secondary | ICD-10-CM | POA: Diagnosis not present

## 2016-12-14 DIAGNOSIS — M810 Age-related osteoporosis without current pathological fracture: Secondary | ICD-10-CM | POA: Diagnosis not present

## 2016-12-16 DIAGNOSIS — I10 Essential (primary) hypertension: Secondary | ICD-10-CM | POA: Diagnosis not present

## 2016-12-16 DIAGNOSIS — R412 Retrograde amnesia: Secondary | ICD-10-CM | POA: Diagnosis not present

## 2016-12-16 DIAGNOSIS — F419 Anxiety disorder, unspecified: Secondary | ICD-10-CM | POA: Diagnosis not present

## 2016-12-16 DIAGNOSIS — M810 Age-related osteoporosis without current pathological fracture: Secondary | ICD-10-CM | POA: Diagnosis not present

## 2016-12-17 DIAGNOSIS — I10 Essential (primary) hypertension: Secondary | ICD-10-CM | POA: Diagnosis not present

## 2016-12-17 DIAGNOSIS — F419 Anxiety disorder, unspecified: Secondary | ICD-10-CM | POA: Diagnosis not present

## 2016-12-17 DIAGNOSIS — R412 Retrograde amnesia: Secondary | ICD-10-CM | POA: Diagnosis not present

## 2016-12-17 DIAGNOSIS — M810 Age-related osteoporosis without current pathological fracture: Secondary | ICD-10-CM | POA: Diagnosis not present

## 2016-12-18 DIAGNOSIS — F419 Anxiety disorder, unspecified: Secondary | ICD-10-CM | POA: Diagnosis not present

## 2016-12-18 DIAGNOSIS — I10 Essential (primary) hypertension: Secondary | ICD-10-CM | POA: Diagnosis not present

## 2016-12-18 DIAGNOSIS — R412 Retrograde amnesia: Secondary | ICD-10-CM | POA: Diagnosis not present

## 2016-12-18 DIAGNOSIS — M810 Age-related osteoporosis without current pathological fracture: Secondary | ICD-10-CM | POA: Diagnosis not present

## 2016-12-18 DIAGNOSIS — Z961 Presence of intraocular lens: Secondary | ICD-10-CM | POA: Diagnosis not present

## 2016-12-19 DIAGNOSIS — I10 Essential (primary) hypertension: Secondary | ICD-10-CM | POA: Diagnosis not present

## 2016-12-19 DIAGNOSIS — F419 Anxiety disorder, unspecified: Secondary | ICD-10-CM | POA: Diagnosis not present

## 2016-12-19 DIAGNOSIS — M810 Age-related osteoporosis without current pathological fracture: Secondary | ICD-10-CM | POA: Diagnosis not present

## 2016-12-19 DIAGNOSIS — R412 Retrograde amnesia: Secondary | ICD-10-CM | POA: Diagnosis not present

## 2016-12-20 DIAGNOSIS — F419 Anxiety disorder, unspecified: Secondary | ICD-10-CM | POA: Diagnosis not present

## 2016-12-20 DIAGNOSIS — I10 Essential (primary) hypertension: Secondary | ICD-10-CM | POA: Diagnosis not present

## 2016-12-20 DIAGNOSIS — R412 Retrograde amnesia: Secondary | ICD-10-CM | POA: Diagnosis not present

## 2016-12-20 DIAGNOSIS — M810 Age-related osteoporosis without current pathological fracture: Secondary | ICD-10-CM | POA: Diagnosis not present

## 2016-12-21 DIAGNOSIS — R412 Retrograde amnesia: Secondary | ICD-10-CM | POA: Diagnosis not present

## 2016-12-21 DIAGNOSIS — I10 Essential (primary) hypertension: Secondary | ICD-10-CM | POA: Diagnosis not present

## 2016-12-21 DIAGNOSIS — F419 Anxiety disorder, unspecified: Secondary | ICD-10-CM | POA: Diagnosis not present

## 2016-12-21 DIAGNOSIS — M810 Age-related osteoporosis without current pathological fracture: Secondary | ICD-10-CM | POA: Diagnosis not present

## 2016-12-22 DIAGNOSIS — M810 Age-related osteoporosis without current pathological fracture: Secondary | ICD-10-CM | POA: Diagnosis not present

## 2016-12-22 DIAGNOSIS — F419 Anxiety disorder, unspecified: Secondary | ICD-10-CM | POA: Diagnosis not present

## 2016-12-22 DIAGNOSIS — I10 Essential (primary) hypertension: Secondary | ICD-10-CM | POA: Diagnosis not present

## 2016-12-22 DIAGNOSIS — R412 Retrograde amnesia: Secondary | ICD-10-CM | POA: Diagnosis not present

## 2016-12-23 DIAGNOSIS — R412 Retrograde amnesia: Secondary | ICD-10-CM | POA: Diagnosis not present

## 2016-12-23 DIAGNOSIS — I10 Essential (primary) hypertension: Secondary | ICD-10-CM | POA: Diagnosis not present

## 2016-12-23 DIAGNOSIS — M810 Age-related osteoporosis without current pathological fracture: Secondary | ICD-10-CM | POA: Diagnosis not present

## 2016-12-23 DIAGNOSIS — F419 Anxiety disorder, unspecified: Secondary | ICD-10-CM | POA: Diagnosis not present

## 2016-12-24 DIAGNOSIS — F419 Anxiety disorder, unspecified: Secondary | ICD-10-CM | POA: Diagnosis not present

## 2016-12-24 DIAGNOSIS — R412 Retrograde amnesia: Secondary | ICD-10-CM | POA: Diagnosis not present

## 2016-12-24 DIAGNOSIS — I10 Essential (primary) hypertension: Secondary | ICD-10-CM | POA: Diagnosis not present

## 2016-12-24 DIAGNOSIS — M810 Age-related osteoporosis without current pathological fracture: Secondary | ICD-10-CM | POA: Diagnosis not present

## 2016-12-25 DIAGNOSIS — M810 Age-related osteoporosis without current pathological fracture: Secondary | ICD-10-CM | POA: Diagnosis not present

## 2016-12-25 DIAGNOSIS — F419 Anxiety disorder, unspecified: Secondary | ICD-10-CM | POA: Diagnosis not present

## 2016-12-25 DIAGNOSIS — R412 Retrograde amnesia: Secondary | ICD-10-CM | POA: Diagnosis not present

## 2016-12-25 DIAGNOSIS — I10 Essential (primary) hypertension: Secondary | ICD-10-CM | POA: Diagnosis not present

## 2016-12-26 DIAGNOSIS — F419 Anxiety disorder, unspecified: Secondary | ICD-10-CM | POA: Diagnosis not present

## 2016-12-26 DIAGNOSIS — R412 Retrograde amnesia: Secondary | ICD-10-CM | POA: Diagnosis not present

## 2016-12-26 DIAGNOSIS — M810 Age-related osteoporosis without current pathological fracture: Secondary | ICD-10-CM | POA: Diagnosis not present

## 2016-12-26 DIAGNOSIS — I10 Essential (primary) hypertension: Secondary | ICD-10-CM | POA: Diagnosis not present

## 2016-12-27 DIAGNOSIS — F419 Anxiety disorder, unspecified: Secondary | ICD-10-CM | POA: Diagnosis not present

## 2016-12-27 DIAGNOSIS — M810 Age-related osteoporosis without current pathological fracture: Secondary | ICD-10-CM | POA: Diagnosis not present

## 2016-12-27 DIAGNOSIS — I10 Essential (primary) hypertension: Secondary | ICD-10-CM | POA: Diagnosis not present

## 2016-12-27 DIAGNOSIS — R412 Retrograde amnesia: Secondary | ICD-10-CM | POA: Diagnosis not present

## 2016-12-28 DIAGNOSIS — R412 Retrograde amnesia: Secondary | ICD-10-CM | POA: Diagnosis not present

## 2016-12-28 DIAGNOSIS — I10 Essential (primary) hypertension: Secondary | ICD-10-CM | POA: Diagnosis not present

## 2016-12-28 DIAGNOSIS — F419 Anxiety disorder, unspecified: Secondary | ICD-10-CM | POA: Diagnosis not present

## 2016-12-28 DIAGNOSIS — M810 Age-related osteoporosis without current pathological fracture: Secondary | ICD-10-CM | POA: Diagnosis not present

## 2016-12-29 DIAGNOSIS — F419 Anxiety disorder, unspecified: Secondary | ICD-10-CM | POA: Diagnosis not present

## 2016-12-29 DIAGNOSIS — I10 Essential (primary) hypertension: Secondary | ICD-10-CM | POA: Diagnosis not present

## 2016-12-29 DIAGNOSIS — R412 Retrograde amnesia: Secondary | ICD-10-CM | POA: Diagnosis not present

## 2016-12-29 DIAGNOSIS — M810 Age-related osteoporosis without current pathological fracture: Secondary | ICD-10-CM | POA: Diagnosis not present

## 2016-12-30 DIAGNOSIS — I10 Essential (primary) hypertension: Secondary | ICD-10-CM | POA: Diagnosis not present

## 2016-12-30 DIAGNOSIS — F419 Anxiety disorder, unspecified: Secondary | ICD-10-CM | POA: Diagnosis not present

## 2016-12-30 DIAGNOSIS — R412 Retrograde amnesia: Secondary | ICD-10-CM | POA: Diagnosis not present

## 2016-12-30 DIAGNOSIS — M810 Age-related osteoporosis without current pathological fracture: Secondary | ICD-10-CM | POA: Diagnosis not present

## 2016-12-31 DIAGNOSIS — I10 Essential (primary) hypertension: Secondary | ICD-10-CM | POA: Diagnosis not present

## 2016-12-31 DIAGNOSIS — M810 Age-related osteoporosis without current pathological fracture: Secondary | ICD-10-CM | POA: Diagnosis not present

## 2016-12-31 DIAGNOSIS — F419 Anxiety disorder, unspecified: Secondary | ICD-10-CM | POA: Diagnosis not present

## 2016-12-31 DIAGNOSIS — R412 Retrograde amnesia: Secondary | ICD-10-CM | POA: Diagnosis not present

## 2017-01-01 DIAGNOSIS — M810 Age-related osteoporosis without current pathological fracture: Secondary | ICD-10-CM | POA: Diagnosis not present

## 2017-01-01 DIAGNOSIS — R412 Retrograde amnesia: Secondary | ICD-10-CM | POA: Diagnosis not present

## 2017-01-01 DIAGNOSIS — I10 Essential (primary) hypertension: Secondary | ICD-10-CM | POA: Diagnosis not present

## 2017-01-01 DIAGNOSIS — F419 Anxiety disorder, unspecified: Secondary | ICD-10-CM | POA: Diagnosis not present

## 2017-01-02 DIAGNOSIS — I10 Essential (primary) hypertension: Secondary | ICD-10-CM | POA: Diagnosis not present

## 2017-01-02 DIAGNOSIS — F419 Anxiety disorder, unspecified: Secondary | ICD-10-CM | POA: Diagnosis not present

## 2017-01-02 DIAGNOSIS — R412 Retrograde amnesia: Secondary | ICD-10-CM | POA: Diagnosis not present

## 2017-01-02 DIAGNOSIS — M810 Age-related osteoporosis without current pathological fracture: Secondary | ICD-10-CM | POA: Diagnosis not present

## 2017-01-03 DIAGNOSIS — F419 Anxiety disorder, unspecified: Secondary | ICD-10-CM | POA: Diagnosis not present

## 2017-01-03 DIAGNOSIS — R412 Retrograde amnesia: Secondary | ICD-10-CM | POA: Diagnosis not present

## 2017-01-03 DIAGNOSIS — M810 Age-related osteoporosis without current pathological fracture: Secondary | ICD-10-CM | POA: Diagnosis not present

## 2017-01-03 DIAGNOSIS — I10 Essential (primary) hypertension: Secondary | ICD-10-CM | POA: Diagnosis not present

## 2017-01-04 DIAGNOSIS — I10 Essential (primary) hypertension: Secondary | ICD-10-CM | POA: Diagnosis not present

## 2017-01-04 DIAGNOSIS — M810 Age-related osteoporosis without current pathological fracture: Secondary | ICD-10-CM | POA: Diagnosis not present

## 2017-01-04 DIAGNOSIS — R412 Retrograde amnesia: Secondary | ICD-10-CM | POA: Diagnosis not present

## 2017-01-04 DIAGNOSIS — F419 Anxiety disorder, unspecified: Secondary | ICD-10-CM | POA: Diagnosis not present

## 2017-01-05 DIAGNOSIS — I10 Essential (primary) hypertension: Secondary | ICD-10-CM | POA: Diagnosis not present

## 2017-01-05 DIAGNOSIS — R412 Retrograde amnesia: Secondary | ICD-10-CM | POA: Diagnosis not present

## 2017-01-05 DIAGNOSIS — F419 Anxiety disorder, unspecified: Secondary | ICD-10-CM | POA: Diagnosis not present

## 2017-01-05 DIAGNOSIS — M810 Age-related osteoporosis without current pathological fracture: Secondary | ICD-10-CM | POA: Diagnosis not present

## 2017-01-06 DIAGNOSIS — F419 Anxiety disorder, unspecified: Secondary | ICD-10-CM | POA: Diagnosis not present

## 2017-01-06 DIAGNOSIS — M810 Age-related osteoporosis without current pathological fracture: Secondary | ICD-10-CM | POA: Diagnosis not present

## 2017-01-06 DIAGNOSIS — R412 Retrograde amnesia: Secondary | ICD-10-CM | POA: Diagnosis not present

## 2017-01-06 DIAGNOSIS — I10 Essential (primary) hypertension: Secondary | ICD-10-CM | POA: Diagnosis not present

## 2017-01-07 DIAGNOSIS — F419 Anxiety disorder, unspecified: Secondary | ICD-10-CM | POA: Diagnosis not present

## 2017-01-07 DIAGNOSIS — M810 Age-related osteoporosis without current pathological fracture: Secondary | ICD-10-CM | POA: Diagnosis not present

## 2017-01-07 DIAGNOSIS — I10 Essential (primary) hypertension: Secondary | ICD-10-CM | POA: Diagnosis not present

## 2017-01-07 DIAGNOSIS — R412 Retrograde amnesia: Secondary | ICD-10-CM | POA: Diagnosis not present

## 2017-01-08 DIAGNOSIS — M810 Age-related osteoporosis without current pathological fracture: Secondary | ICD-10-CM | POA: Diagnosis not present

## 2017-01-08 DIAGNOSIS — R412 Retrograde amnesia: Secondary | ICD-10-CM | POA: Diagnosis not present

## 2017-01-08 DIAGNOSIS — I10 Essential (primary) hypertension: Secondary | ICD-10-CM | POA: Diagnosis not present

## 2017-01-08 DIAGNOSIS — F419 Anxiety disorder, unspecified: Secondary | ICD-10-CM | POA: Diagnosis not present

## 2017-01-09 DIAGNOSIS — F419 Anxiety disorder, unspecified: Secondary | ICD-10-CM | POA: Diagnosis not present

## 2017-01-09 DIAGNOSIS — M810 Age-related osteoporosis without current pathological fracture: Secondary | ICD-10-CM | POA: Diagnosis not present

## 2017-01-09 DIAGNOSIS — R412 Retrograde amnesia: Secondary | ICD-10-CM | POA: Diagnosis not present

## 2017-01-09 DIAGNOSIS — I10 Essential (primary) hypertension: Secondary | ICD-10-CM | POA: Diagnosis not present

## 2017-01-10 DIAGNOSIS — R412 Retrograde amnesia: Secondary | ICD-10-CM | POA: Diagnosis not present

## 2017-01-10 DIAGNOSIS — F419 Anxiety disorder, unspecified: Secondary | ICD-10-CM | POA: Diagnosis not present

## 2017-01-10 DIAGNOSIS — M810 Age-related osteoporosis without current pathological fracture: Secondary | ICD-10-CM | POA: Diagnosis not present

## 2017-01-10 DIAGNOSIS — I10 Essential (primary) hypertension: Secondary | ICD-10-CM | POA: Diagnosis not present

## 2017-01-11 DIAGNOSIS — F419 Anxiety disorder, unspecified: Secondary | ICD-10-CM | POA: Diagnosis not present

## 2017-01-11 DIAGNOSIS — M810 Age-related osteoporosis without current pathological fracture: Secondary | ICD-10-CM | POA: Diagnosis not present

## 2017-01-11 DIAGNOSIS — R412 Retrograde amnesia: Secondary | ICD-10-CM | POA: Diagnosis not present

## 2017-01-11 DIAGNOSIS — I10 Essential (primary) hypertension: Secondary | ICD-10-CM | POA: Diagnosis not present

## 2017-01-12 DIAGNOSIS — M810 Age-related osteoporosis without current pathological fracture: Secondary | ICD-10-CM | POA: Diagnosis not present

## 2017-01-12 DIAGNOSIS — I10 Essential (primary) hypertension: Secondary | ICD-10-CM | POA: Diagnosis not present

## 2017-01-12 DIAGNOSIS — R412 Retrograde amnesia: Secondary | ICD-10-CM | POA: Diagnosis not present

## 2017-01-12 DIAGNOSIS — F419 Anxiety disorder, unspecified: Secondary | ICD-10-CM | POA: Diagnosis not present

## 2017-01-13 DIAGNOSIS — R412 Retrograde amnesia: Secondary | ICD-10-CM | POA: Diagnosis not present

## 2017-01-13 DIAGNOSIS — I10 Essential (primary) hypertension: Secondary | ICD-10-CM | POA: Diagnosis not present

## 2017-01-13 DIAGNOSIS — M810 Age-related osteoporosis without current pathological fracture: Secondary | ICD-10-CM | POA: Diagnosis not present

## 2017-01-13 DIAGNOSIS — F419 Anxiety disorder, unspecified: Secondary | ICD-10-CM | POA: Diagnosis not present

## 2017-01-14 DIAGNOSIS — F419 Anxiety disorder, unspecified: Secondary | ICD-10-CM | POA: Diagnosis not present

## 2017-01-14 DIAGNOSIS — I10 Essential (primary) hypertension: Secondary | ICD-10-CM | POA: Diagnosis not present

## 2017-01-14 DIAGNOSIS — R412 Retrograde amnesia: Secondary | ICD-10-CM | POA: Diagnosis not present

## 2017-01-14 DIAGNOSIS — M810 Age-related osteoporosis without current pathological fracture: Secondary | ICD-10-CM | POA: Diagnosis not present

## 2017-01-15 DIAGNOSIS — F419 Anxiety disorder, unspecified: Secondary | ICD-10-CM | POA: Diagnosis not present

## 2017-01-15 DIAGNOSIS — M810 Age-related osteoporosis without current pathological fracture: Secondary | ICD-10-CM | POA: Diagnosis not present

## 2017-01-15 DIAGNOSIS — R412 Retrograde amnesia: Secondary | ICD-10-CM | POA: Diagnosis not present

## 2017-01-15 DIAGNOSIS — I10 Essential (primary) hypertension: Secondary | ICD-10-CM | POA: Diagnosis not present

## 2017-01-16 DIAGNOSIS — T1501XA Foreign body in cornea, right eye, initial encounter: Secondary | ICD-10-CM | POA: Diagnosis not present

## 2017-01-16 DIAGNOSIS — F419 Anxiety disorder, unspecified: Secondary | ICD-10-CM | POA: Diagnosis not present

## 2017-01-16 DIAGNOSIS — R412 Retrograde amnesia: Secondary | ICD-10-CM | POA: Diagnosis not present

## 2017-01-16 DIAGNOSIS — I10 Essential (primary) hypertension: Secondary | ICD-10-CM | POA: Diagnosis not present

## 2017-01-16 DIAGNOSIS — M810 Age-related osteoporosis without current pathological fracture: Secondary | ICD-10-CM | POA: Diagnosis not present

## 2017-01-17 DIAGNOSIS — M810 Age-related osteoporosis without current pathological fracture: Secondary | ICD-10-CM | POA: Diagnosis not present

## 2017-01-17 DIAGNOSIS — R412 Retrograde amnesia: Secondary | ICD-10-CM | POA: Diagnosis not present

## 2017-01-17 DIAGNOSIS — F419 Anxiety disorder, unspecified: Secondary | ICD-10-CM | POA: Diagnosis not present

## 2017-01-17 DIAGNOSIS — I10 Essential (primary) hypertension: Secondary | ICD-10-CM | POA: Diagnosis not present

## 2017-01-18 DIAGNOSIS — R412 Retrograde amnesia: Secondary | ICD-10-CM | POA: Diagnosis not present

## 2017-01-18 DIAGNOSIS — F419 Anxiety disorder, unspecified: Secondary | ICD-10-CM | POA: Diagnosis not present

## 2017-01-18 DIAGNOSIS — I10 Essential (primary) hypertension: Secondary | ICD-10-CM | POA: Diagnosis not present

## 2017-01-18 DIAGNOSIS — M810 Age-related osteoporosis without current pathological fracture: Secondary | ICD-10-CM | POA: Diagnosis not present

## 2017-01-19 DIAGNOSIS — R412 Retrograde amnesia: Secondary | ICD-10-CM | POA: Diagnosis not present

## 2017-01-19 DIAGNOSIS — I10 Essential (primary) hypertension: Secondary | ICD-10-CM | POA: Diagnosis not present

## 2017-01-19 DIAGNOSIS — M810 Age-related osteoporosis without current pathological fracture: Secondary | ICD-10-CM | POA: Diagnosis not present

## 2017-01-19 DIAGNOSIS — F419 Anxiety disorder, unspecified: Secondary | ICD-10-CM | POA: Diagnosis not present

## 2017-01-20 DIAGNOSIS — R412 Retrograde amnesia: Secondary | ICD-10-CM | POA: Diagnosis not present

## 2017-01-20 DIAGNOSIS — F419 Anxiety disorder, unspecified: Secondary | ICD-10-CM | POA: Diagnosis not present

## 2017-01-20 DIAGNOSIS — I10 Essential (primary) hypertension: Secondary | ICD-10-CM | POA: Diagnosis not present

## 2017-01-20 DIAGNOSIS — M810 Age-related osteoporosis without current pathological fracture: Secondary | ICD-10-CM | POA: Diagnosis not present

## 2017-01-21 DIAGNOSIS — F419 Anxiety disorder, unspecified: Secondary | ICD-10-CM | POA: Diagnosis not present

## 2017-01-21 DIAGNOSIS — I10 Essential (primary) hypertension: Secondary | ICD-10-CM | POA: Diagnosis not present

## 2017-01-21 DIAGNOSIS — M810 Age-related osteoporosis without current pathological fracture: Secondary | ICD-10-CM | POA: Diagnosis not present

## 2017-01-21 DIAGNOSIS — R412 Retrograde amnesia: Secondary | ICD-10-CM | POA: Diagnosis not present

## 2017-01-22 DIAGNOSIS — I10 Essential (primary) hypertension: Secondary | ICD-10-CM | POA: Diagnosis not present

## 2017-01-22 DIAGNOSIS — M810 Age-related osteoporosis without current pathological fracture: Secondary | ICD-10-CM | POA: Diagnosis not present

## 2017-01-22 DIAGNOSIS — R412 Retrograde amnesia: Secondary | ICD-10-CM | POA: Diagnosis not present

## 2017-01-22 DIAGNOSIS — F419 Anxiety disorder, unspecified: Secondary | ICD-10-CM | POA: Diagnosis not present

## 2017-01-23 DIAGNOSIS — R412 Retrograde amnesia: Secondary | ICD-10-CM | POA: Diagnosis not present

## 2017-01-23 DIAGNOSIS — M810 Age-related osteoporosis without current pathological fracture: Secondary | ICD-10-CM | POA: Diagnosis not present

## 2017-01-23 DIAGNOSIS — I10 Essential (primary) hypertension: Secondary | ICD-10-CM | POA: Diagnosis not present

## 2017-01-23 DIAGNOSIS — F419 Anxiety disorder, unspecified: Secondary | ICD-10-CM | POA: Diagnosis not present

## 2017-01-24 DIAGNOSIS — R412 Retrograde amnesia: Secondary | ICD-10-CM | POA: Diagnosis not present

## 2017-01-24 DIAGNOSIS — F419 Anxiety disorder, unspecified: Secondary | ICD-10-CM | POA: Diagnosis not present

## 2017-01-24 DIAGNOSIS — I10 Essential (primary) hypertension: Secondary | ICD-10-CM | POA: Diagnosis not present

## 2017-01-24 DIAGNOSIS — M810 Age-related osteoporosis without current pathological fracture: Secondary | ICD-10-CM | POA: Diagnosis not present

## 2017-01-25 DIAGNOSIS — I10 Essential (primary) hypertension: Secondary | ICD-10-CM | POA: Diagnosis not present

## 2017-01-25 DIAGNOSIS — R412 Retrograde amnesia: Secondary | ICD-10-CM | POA: Diagnosis not present

## 2017-01-25 DIAGNOSIS — F419 Anxiety disorder, unspecified: Secondary | ICD-10-CM | POA: Diagnosis not present

## 2017-01-25 DIAGNOSIS — M810 Age-related osteoporosis without current pathological fracture: Secondary | ICD-10-CM | POA: Diagnosis not present

## 2017-01-26 DIAGNOSIS — I10 Essential (primary) hypertension: Secondary | ICD-10-CM | POA: Diagnosis not present

## 2017-01-26 DIAGNOSIS — R412 Retrograde amnesia: Secondary | ICD-10-CM | POA: Diagnosis not present

## 2017-01-26 DIAGNOSIS — F419 Anxiety disorder, unspecified: Secondary | ICD-10-CM | POA: Diagnosis not present

## 2017-01-26 DIAGNOSIS — M810 Age-related osteoporosis without current pathological fracture: Secondary | ICD-10-CM | POA: Diagnosis not present

## 2017-01-27 DIAGNOSIS — M810 Age-related osteoporosis without current pathological fracture: Secondary | ICD-10-CM | POA: Diagnosis not present

## 2017-01-27 DIAGNOSIS — F419 Anxiety disorder, unspecified: Secondary | ICD-10-CM | POA: Diagnosis not present

## 2017-01-27 DIAGNOSIS — R412 Retrograde amnesia: Secondary | ICD-10-CM | POA: Diagnosis not present

## 2017-01-27 DIAGNOSIS — I10 Essential (primary) hypertension: Secondary | ICD-10-CM | POA: Diagnosis not present

## 2017-01-28 DIAGNOSIS — R412 Retrograde amnesia: Secondary | ICD-10-CM | POA: Diagnosis not present

## 2017-01-28 DIAGNOSIS — I10 Essential (primary) hypertension: Secondary | ICD-10-CM | POA: Diagnosis not present

## 2017-01-28 DIAGNOSIS — F419 Anxiety disorder, unspecified: Secondary | ICD-10-CM | POA: Diagnosis not present

## 2017-01-28 DIAGNOSIS — M810 Age-related osteoporosis without current pathological fracture: Secondary | ICD-10-CM | POA: Diagnosis not present

## 2017-01-29 DIAGNOSIS — I10 Essential (primary) hypertension: Secondary | ICD-10-CM | POA: Diagnosis not present

## 2017-01-29 DIAGNOSIS — M810 Age-related osteoporosis without current pathological fracture: Secondary | ICD-10-CM | POA: Diagnosis not present

## 2017-01-29 DIAGNOSIS — R412 Retrograde amnesia: Secondary | ICD-10-CM | POA: Diagnosis not present

## 2017-01-29 DIAGNOSIS — F419 Anxiety disorder, unspecified: Secondary | ICD-10-CM | POA: Diagnosis not present

## 2017-01-30 DIAGNOSIS — I10 Essential (primary) hypertension: Secondary | ICD-10-CM | POA: Diagnosis not present

## 2017-01-30 DIAGNOSIS — M810 Age-related osteoporosis without current pathological fracture: Secondary | ICD-10-CM | POA: Diagnosis not present

## 2017-01-30 DIAGNOSIS — F419 Anxiety disorder, unspecified: Secondary | ICD-10-CM | POA: Diagnosis not present

## 2017-01-30 DIAGNOSIS — R412 Retrograde amnesia: Secondary | ICD-10-CM | POA: Diagnosis not present

## 2017-01-31 DIAGNOSIS — I10 Essential (primary) hypertension: Secondary | ICD-10-CM | POA: Diagnosis not present

## 2017-01-31 DIAGNOSIS — M810 Age-related osteoporosis without current pathological fracture: Secondary | ICD-10-CM | POA: Diagnosis not present

## 2017-01-31 DIAGNOSIS — F419 Anxiety disorder, unspecified: Secondary | ICD-10-CM | POA: Diagnosis not present

## 2017-01-31 DIAGNOSIS — R412 Retrograde amnesia: Secondary | ICD-10-CM | POA: Diagnosis not present

## 2017-02-01 DIAGNOSIS — I10 Essential (primary) hypertension: Secondary | ICD-10-CM | POA: Diagnosis not present

## 2017-02-01 DIAGNOSIS — M810 Age-related osteoporosis without current pathological fracture: Secondary | ICD-10-CM | POA: Diagnosis not present

## 2017-02-01 DIAGNOSIS — F419 Anxiety disorder, unspecified: Secondary | ICD-10-CM | POA: Diagnosis not present

## 2017-02-01 DIAGNOSIS — R412 Retrograde amnesia: Secondary | ICD-10-CM | POA: Diagnosis not present

## 2017-02-02 DIAGNOSIS — R412 Retrograde amnesia: Secondary | ICD-10-CM | POA: Diagnosis not present

## 2017-02-02 DIAGNOSIS — F419 Anxiety disorder, unspecified: Secondary | ICD-10-CM | POA: Diagnosis not present

## 2017-02-02 DIAGNOSIS — I10 Essential (primary) hypertension: Secondary | ICD-10-CM | POA: Diagnosis not present

## 2017-02-02 DIAGNOSIS — M810 Age-related osteoporosis without current pathological fracture: Secondary | ICD-10-CM | POA: Diagnosis not present

## 2017-02-03 DIAGNOSIS — I10 Essential (primary) hypertension: Secondary | ICD-10-CM | POA: Diagnosis not present

## 2017-02-03 DIAGNOSIS — R412 Retrograde amnesia: Secondary | ICD-10-CM | POA: Diagnosis not present

## 2017-02-03 DIAGNOSIS — F419 Anxiety disorder, unspecified: Secondary | ICD-10-CM | POA: Diagnosis not present

## 2017-02-03 DIAGNOSIS — M810 Age-related osteoporosis without current pathological fracture: Secondary | ICD-10-CM | POA: Diagnosis not present

## 2017-02-04 DIAGNOSIS — M810 Age-related osteoporosis without current pathological fracture: Secondary | ICD-10-CM | POA: Diagnosis not present

## 2017-02-04 DIAGNOSIS — R412 Retrograde amnesia: Secondary | ICD-10-CM | POA: Diagnosis not present

## 2017-02-04 DIAGNOSIS — I10 Essential (primary) hypertension: Secondary | ICD-10-CM | POA: Diagnosis not present

## 2017-02-04 DIAGNOSIS — F419 Anxiety disorder, unspecified: Secondary | ICD-10-CM | POA: Diagnosis not present

## 2017-02-05 DIAGNOSIS — I10 Essential (primary) hypertension: Secondary | ICD-10-CM | POA: Diagnosis not present

## 2017-02-05 DIAGNOSIS — M810 Age-related osteoporosis without current pathological fracture: Secondary | ICD-10-CM | POA: Diagnosis not present

## 2017-02-05 DIAGNOSIS — R412 Retrograde amnesia: Secondary | ICD-10-CM | POA: Diagnosis not present

## 2017-02-05 DIAGNOSIS — F419 Anxiety disorder, unspecified: Secondary | ICD-10-CM | POA: Diagnosis not present

## 2017-02-06 DIAGNOSIS — R412 Retrograde amnesia: Secondary | ICD-10-CM | POA: Diagnosis not present

## 2017-02-06 DIAGNOSIS — M810 Age-related osteoporosis without current pathological fracture: Secondary | ICD-10-CM | POA: Diagnosis not present

## 2017-02-06 DIAGNOSIS — F419 Anxiety disorder, unspecified: Secondary | ICD-10-CM | POA: Diagnosis not present

## 2017-02-06 DIAGNOSIS — I10 Essential (primary) hypertension: Secondary | ICD-10-CM | POA: Diagnosis not present

## 2017-02-07 DIAGNOSIS — I10 Essential (primary) hypertension: Secondary | ICD-10-CM | POA: Diagnosis not present

## 2017-02-07 DIAGNOSIS — R412 Retrograde amnesia: Secondary | ICD-10-CM | POA: Diagnosis not present

## 2017-02-07 DIAGNOSIS — F419 Anxiety disorder, unspecified: Secondary | ICD-10-CM | POA: Diagnosis not present

## 2017-02-07 DIAGNOSIS — M810 Age-related osteoporosis without current pathological fracture: Secondary | ICD-10-CM | POA: Diagnosis not present

## 2017-02-08 DIAGNOSIS — F419 Anxiety disorder, unspecified: Secondary | ICD-10-CM | POA: Diagnosis not present

## 2017-02-08 DIAGNOSIS — I10 Essential (primary) hypertension: Secondary | ICD-10-CM | POA: Diagnosis not present

## 2017-02-08 DIAGNOSIS — M810 Age-related osteoporosis without current pathological fracture: Secondary | ICD-10-CM | POA: Diagnosis not present

## 2017-02-08 DIAGNOSIS — R412 Retrograde amnesia: Secondary | ICD-10-CM | POA: Diagnosis not present

## 2017-02-09 DIAGNOSIS — I10 Essential (primary) hypertension: Secondary | ICD-10-CM | POA: Diagnosis not present

## 2017-02-09 DIAGNOSIS — M810 Age-related osteoporosis without current pathological fracture: Secondary | ICD-10-CM | POA: Diagnosis not present

## 2017-02-09 DIAGNOSIS — R412 Retrograde amnesia: Secondary | ICD-10-CM | POA: Diagnosis not present

## 2017-02-09 DIAGNOSIS — F419 Anxiety disorder, unspecified: Secondary | ICD-10-CM | POA: Diagnosis not present

## 2017-02-10 DIAGNOSIS — R412 Retrograde amnesia: Secondary | ICD-10-CM | POA: Diagnosis not present

## 2017-02-10 DIAGNOSIS — I10 Essential (primary) hypertension: Secondary | ICD-10-CM | POA: Diagnosis not present

## 2017-02-10 DIAGNOSIS — F419 Anxiety disorder, unspecified: Secondary | ICD-10-CM | POA: Diagnosis not present

## 2017-02-10 DIAGNOSIS — M810 Age-related osteoporosis without current pathological fracture: Secondary | ICD-10-CM | POA: Diagnosis not present

## 2017-02-11 DIAGNOSIS — F419 Anxiety disorder, unspecified: Secondary | ICD-10-CM | POA: Diagnosis not present

## 2017-02-11 DIAGNOSIS — R412 Retrograde amnesia: Secondary | ICD-10-CM | POA: Diagnosis not present

## 2017-02-11 DIAGNOSIS — M810 Age-related osteoporosis without current pathological fracture: Secondary | ICD-10-CM | POA: Diagnosis not present

## 2017-02-11 DIAGNOSIS — I10 Essential (primary) hypertension: Secondary | ICD-10-CM | POA: Diagnosis not present

## 2017-02-12 DIAGNOSIS — I10 Essential (primary) hypertension: Secondary | ICD-10-CM | POA: Diagnosis not present

## 2017-02-12 DIAGNOSIS — F419 Anxiety disorder, unspecified: Secondary | ICD-10-CM | POA: Diagnosis not present

## 2017-02-12 DIAGNOSIS — R412 Retrograde amnesia: Secondary | ICD-10-CM | POA: Diagnosis not present

## 2017-02-12 DIAGNOSIS — M810 Age-related osteoporosis without current pathological fracture: Secondary | ICD-10-CM | POA: Diagnosis not present

## 2017-02-13 DIAGNOSIS — M810 Age-related osteoporosis without current pathological fracture: Secondary | ICD-10-CM | POA: Diagnosis not present

## 2017-02-13 DIAGNOSIS — F419 Anxiety disorder, unspecified: Secondary | ICD-10-CM | POA: Diagnosis not present

## 2017-02-13 DIAGNOSIS — I10 Essential (primary) hypertension: Secondary | ICD-10-CM | POA: Diagnosis not present

## 2017-02-13 DIAGNOSIS — R412 Retrograde amnesia: Secondary | ICD-10-CM | POA: Diagnosis not present

## 2017-02-14 DIAGNOSIS — I10 Essential (primary) hypertension: Secondary | ICD-10-CM | POA: Diagnosis not present

## 2017-02-14 DIAGNOSIS — F419 Anxiety disorder, unspecified: Secondary | ICD-10-CM | POA: Diagnosis not present

## 2017-02-14 DIAGNOSIS — R412 Retrograde amnesia: Secondary | ICD-10-CM | POA: Diagnosis not present

## 2017-02-14 DIAGNOSIS — M810 Age-related osteoporosis without current pathological fracture: Secondary | ICD-10-CM | POA: Diagnosis not present

## 2017-02-15 DIAGNOSIS — M810 Age-related osteoporosis without current pathological fracture: Secondary | ICD-10-CM | POA: Diagnosis not present

## 2017-02-15 DIAGNOSIS — I10 Essential (primary) hypertension: Secondary | ICD-10-CM | POA: Diagnosis not present

## 2017-02-15 DIAGNOSIS — R412 Retrograde amnesia: Secondary | ICD-10-CM | POA: Diagnosis not present

## 2017-02-15 DIAGNOSIS — F419 Anxiety disorder, unspecified: Secondary | ICD-10-CM | POA: Diagnosis not present

## 2017-02-16 DIAGNOSIS — R412 Retrograde amnesia: Secondary | ICD-10-CM | POA: Diagnosis not present

## 2017-02-16 DIAGNOSIS — I10 Essential (primary) hypertension: Secondary | ICD-10-CM | POA: Diagnosis not present

## 2017-02-16 DIAGNOSIS — F419 Anxiety disorder, unspecified: Secondary | ICD-10-CM | POA: Diagnosis not present

## 2017-02-16 DIAGNOSIS — M810 Age-related osteoporosis without current pathological fracture: Secondary | ICD-10-CM | POA: Diagnosis not present

## 2017-02-17 DIAGNOSIS — M810 Age-related osteoporosis without current pathological fracture: Secondary | ICD-10-CM | POA: Diagnosis not present

## 2017-02-17 DIAGNOSIS — I10 Essential (primary) hypertension: Secondary | ICD-10-CM | POA: Diagnosis not present

## 2017-02-17 DIAGNOSIS — F419 Anxiety disorder, unspecified: Secondary | ICD-10-CM | POA: Diagnosis not present

## 2017-02-17 DIAGNOSIS — R412 Retrograde amnesia: Secondary | ICD-10-CM | POA: Diagnosis not present

## 2017-02-18 DIAGNOSIS — I10 Essential (primary) hypertension: Secondary | ICD-10-CM | POA: Diagnosis not present

## 2017-02-18 DIAGNOSIS — R412 Retrograde amnesia: Secondary | ICD-10-CM | POA: Diagnosis not present

## 2017-02-18 DIAGNOSIS — M810 Age-related osteoporosis without current pathological fracture: Secondary | ICD-10-CM | POA: Diagnosis not present

## 2017-02-18 DIAGNOSIS — F419 Anxiety disorder, unspecified: Secondary | ICD-10-CM | POA: Diagnosis not present

## 2017-02-19 DIAGNOSIS — F419 Anxiety disorder, unspecified: Secondary | ICD-10-CM | POA: Diagnosis not present

## 2017-02-19 DIAGNOSIS — M810 Age-related osteoporosis without current pathological fracture: Secondary | ICD-10-CM | POA: Diagnosis not present

## 2017-02-19 DIAGNOSIS — R412 Retrograde amnesia: Secondary | ICD-10-CM | POA: Diagnosis not present

## 2017-02-19 DIAGNOSIS — I10 Essential (primary) hypertension: Secondary | ICD-10-CM | POA: Diagnosis not present

## 2017-02-20 DIAGNOSIS — M810 Age-related osteoporosis without current pathological fracture: Secondary | ICD-10-CM | POA: Diagnosis not present

## 2017-02-20 DIAGNOSIS — F419 Anxiety disorder, unspecified: Secondary | ICD-10-CM | POA: Diagnosis not present

## 2017-02-20 DIAGNOSIS — R412 Retrograde amnesia: Secondary | ICD-10-CM | POA: Diagnosis not present

## 2017-02-20 DIAGNOSIS — I10 Essential (primary) hypertension: Secondary | ICD-10-CM | POA: Diagnosis not present

## 2017-02-21 DIAGNOSIS — M810 Age-related osteoporosis without current pathological fracture: Secondary | ICD-10-CM | POA: Diagnosis not present

## 2017-02-21 DIAGNOSIS — F419 Anxiety disorder, unspecified: Secondary | ICD-10-CM | POA: Diagnosis not present

## 2017-02-21 DIAGNOSIS — R412 Retrograde amnesia: Secondary | ICD-10-CM | POA: Diagnosis not present

## 2017-02-21 DIAGNOSIS — I10 Essential (primary) hypertension: Secondary | ICD-10-CM | POA: Diagnosis not present

## 2017-02-22 DIAGNOSIS — F419 Anxiety disorder, unspecified: Secondary | ICD-10-CM | POA: Diagnosis not present

## 2017-02-22 DIAGNOSIS — M810 Age-related osteoporosis without current pathological fracture: Secondary | ICD-10-CM | POA: Diagnosis not present

## 2017-02-22 DIAGNOSIS — R412 Retrograde amnesia: Secondary | ICD-10-CM | POA: Diagnosis not present

## 2017-02-22 DIAGNOSIS — I10 Essential (primary) hypertension: Secondary | ICD-10-CM | POA: Diagnosis not present

## 2017-02-23 DIAGNOSIS — M810 Age-related osteoporosis without current pathological fracture: Secondary | ICD-10-CM | POA: Diagnosis not present

## 2017-02-23 DIAGNOSIS — I10 Essential (primary) hypertension: Secondary | ICD-10-CM | POA: Diagnosis not present

## 2017-02-23 DIAGNOSIS — R412 Retrograde amnesia: Secondary | ICD-10-CM | POA: Diagnosis not present

## 2017-02-23 DIAGNOSIS — F419 Anxiety disorder, unspecified: Secondary | ICD-10-CM | POA: Diagnosis not present

## 2017-02-24 DIAGNOSIS — R412 Retrograde amnesia: Secondary | ICD-10-CM | POA: Diagnosis not present

## 2017-02-24 DIAGNOSIS — F419 Anxiety disorder, unspecified: Secondary | ICD-10-CM | POA: Diagnosis not present

## 2017-02-24 DIAGNOSIS — M810 Age-related osteoporosis without current pathological fracture: Secondary | ICD-10-CM | POA: Diagnosis not present

## 2017-02-24 DIAGNOSIS — I10 Essential (primary) hypertension: Secondary | ICD-10-CM | POA: Diagnosis not present

## 2017-02-25 DIAGNOSIS — R412 Retrograde amnesia: Secondary | ICD-10-CM | POA: Diagnosis not present

## 2017-02-25 DIAGNOSIS — I10 Essential (primary) hypertension: Secondary | ICD-10-CM | POA: Diagnosis not present

## 2017-02-25 DIAGNOSIS — M810 Age-related osteoporosis without current pathological fracture: Secondary | ICD-10-CM | POA: Diagnosis not present

## 2017-02-25 DIAGNOSIS — F419 Anxiety disorder, unspecified: Secondary | ICD-10-CM | POA: Diagnosis not present

## 2017-02-26 DIAGNOSIS — F419 Anxiety disorder, unspecified: Secondary | ICD-10-CM | POA: Diagnosis not present

## 2017-02-26 DIAGNOSIS — I10 Essential (primary) hypertension: Secondary | ICD-10-CM | POA: Diagnosis not present

## 2017-02-26 DIAGNOSIS — R412 Retrograde amnesia: Secondary | ICD-10-CM | POA: Diagnosis not present

## 2017-02-26 DIAGNOSIS — M810 Age-related osteoporosis without current pathological fracture: Secondary | ICD-10-CM | POA: Diagnosis not present

## 2017-02-27 DIAGNOSIS — R412 Retrograde amnesia: Secondary | ICD-10-CM | POA: Diagnosis not present

## 2017-02-27 DIAGNOSIS — M810 Age-related osteoporosis without current pathological fracture: Secondary | ICD-10-CM | POA: Diagnosis not present

## 2017-02-27 DIAGNOSIS — I10 Essential (primary) hypertension: Secondary | ICD-10-CM | POA: Diagnosis not present

## 2017-02-27 DIAGNOSIS — F419 Anxiety disorder, unspecified: Secondary | ICD-10-CM | POA: Diagnosis not present

## 2017-02-28 DIAGNOSIS — I10 Essential (primary) hypertension: Secondary | ICD-10-CM | POA: Diagnosis not present

## 2017-02-28 DIAGNOSIS — F419 Anxiety disorder, unspecified: Secondary | ICD-10-CM | POA: Diagnosis not present

## 2017-02-28 DIAGNOSIS — R412 Retrograde amnesia: Secondary | ICD-10-CM | POA: Diagnosis not present

## 2017-02-28 DIAGNOSIS — M810 Age-related osteoporosis without current pathological fracture: Secondary | ICD-10-CM | POA: Diagnosis not present

## 2017-03-01 DIAGNOSIS — I10 Essential (primary) hypertension: Secondary | ICD-10-CM | POA: Diagnosis not present

## 2017-03-01 DIAGNOSIS — M810 Age-related osteoporosis without current pathological fracture: Secondary | ICD-10-CM | POA: Diagnosis not present

## 2017-03-01 DIAGNOSIS — R412 Retrograde amnesia: Secondary | ICD-10-CM | POA: Diagnosis not present

## 2017-03-01 DIAGNOSIS — F419 Anxiety disorder, unspecified: Secondary | ICD-10-CM | POA: Diagnosis not present

## 2017-03-02 DIAGNOSIS — M810 Age-related osteoporosis without current pathological fracture: Secondary | ICD-10-CM | POA: Diagnosis not present

## 2017-03-02 DIAGNOSIS — I10 Essential (primary) hypertension: Secondary | ICD-10-CM | POA: Diagnosis not present

## 2017-03-02 DIAGNOSIS — R412 Retrograde amnesia: Secondary | ICD-10-CM | POA: Diagnosis not present

## 2017-03-02 DIAGNOSIS — F419 Anxiety disorder, unspecified: Secondary | ICD-10-CM | POA: Diagnosis not present

## 2017-03-03 DIAGNOSIS — I10 Essential (primary) hypertension: Secondary | ICD-10-CM | POA: Diagnosis not present

## 2017-03-03 DIAGNOSIS — R412 Retrograde amnesia: Secondary | ICD-10-CM | POA: Diagnosis not present

## 2017-03-03 DIAGNOSIS — M810 Age-related osteoporosis without current pathological fracture: Secondary | ICD-10-CM | POA: Diagnosis not present

## 2017-03-03 DIAGNOSIS — F419 Anxiety disorder, unspecified: Secondary | ICD-10-CM | POA: Diagnosis not present

## 2017-03-04 DIAGNOSIS — M810 Age-related osteoporosis without current pathological fracture: Secondary | ICD-10-CM | POA: Diagnosis not present

## 2017-03-04 DIAGNOSIS — I10 Essential (primary) hypertension: Secondary | ICD-10-CM | POA: Diagnosis not present

## 2017-03-04 DIAGNOSIS — F419 Anxiety disorder, unspecified: Secondary | ICD-10-CM | POA: Diagnosis not present

## 2017-03-04 DIAGNOSIS — R412 Retrograde amnesia: Secondary | ICD-10-CM | POA: Diagnosis not present

## 2017-03-05 DIAGNOSIS — F419 Anxiety disorder, unspecified: Secondary | ICD-10-CM | POA: Diagnosis not present

## 2017-03-05 DIAGNOSIS — I10 Essential (primary) hypertension: Secondary | ICD-10-CM | POA: Diagnosis not present

## 2017-03-05 DIAGNOSIS — M810 Age-related osteoporosis without current pathological fracture: Secondary | ICD-10-CM | POA: Diagnosis not present

## 2017-03-05 DIAGNOSIS — R412 Retrograde amnesia: Secondary | ICD-10-CM | POA: Diagnosis not present

## 2017-03-06 DIAGNOSIS — I10 Essential (primary) hypertension: Secondary | ICD-10-CM | POA: Diagnosis not present

## 2017-03-06 DIAGNOSIS — R412 Retrograde amnesia: Secondary | ICD-10-CM | POA: Diagnosis not present

## 2017-03-06 DIAGNOSIS — M810 Age-related osteoporosis without current pathological fracture: Secondary | ICD-10-CM | POA: Diagnosis not present

## 2017-03-06 DIAGNOSIS — F419 Anxiety disorder, unspecified: Secondary | ICD-10-CM | POA: Diagnosis not present

## 2017-03-07 DIAGNOSIS — F419 Anxiety disorder, unspecified: Secondary | ICD-10-CM | POA: Diagnosis not present

## 2017-03-07 DIAGNOSIS — M810 Age-related osteoporosis without current pathological fracture: Secondary | ICD-10-CM | POA: Diagnosis not present

## 2017-03-07 DIAGNOSIS — R412 Retrograde amnesia: Secondary | ICD-10-CM | POA: Diagnosis not present

## 2017-03-07 DIAGNOSIS — I10 Essential (primary) hypertension: Secondary | ICD-10-CM | POA: Diagnosis not present

## 2017-03-08 DIAGNOSIS — F419 Anxiety disorder, unspecified: Secondary | ICD-10-CM | POA: Diagnosis not present

## 2017-03-08 DIAGNOSIS — R412 Retrograde amnesia: Secondary | ICD-10-CM | POA: Diagnosis not present

## 2017-03-08 DIAGNOSIS — I10 Essential (primary) hypertension: Secondary | ICD-10-CM | POA: Diagnosis not present

## 2017-03-08 DIAGNOSIS — M810 Age-related osteoporosis without current pathological fracture: Secondary | ICD-10-CM | POA: Diagnosis not present

## 2017-03-09 DIAGNOSIS — M810 Age-related osteoporosis without current pathological fracture: Secondary | ICD-10-CM | POA: Diagnosis not present

## 2017-03-09 DIAGNOSIS — F419 Anxiety disorder, unspecified: Secondary | ICD-10-CM | POA: Diagnosis not present

## 2017-03-09 DIAGNOSIS — R412 Retrograde amnesia: Secondary | ICD-10-CM | POA: Diagnosis not present

## 2017-03-09 DIAGNOSIS — I10 Essential (primary) hypertension: Secondary | ICD-10-CM | POA: Diagnosis not present

## 2017-03-10 DIAGNOSIS — F419 Anxiety disorder, unspecified: Secondary | ICD-10-CM | POA: Diagnosis not present

## 2017-03-10 DIAGNOSIS — R412 Retrograde amnesia: Secondary | ICD-10-CM | POA: Diagnosis not present

## 2017-03-10 DIAGNOSIS — I10 Essential (primary) hypertension: Secondary | ICD-10-CM | POA: Diagnosis not present

## 2017-03-10 DIAGNOSIS — M810 Age-related osteoporosis without current pathological fracture: Secondary | ICD-10-CM | POA: Diagnosis not present

## 2017-03-11 DIAGNOSIS — M810 Age-related osteoporosis without current pathological fracture: Secondary | ICD-10-CM | POA: Diagnosis not present

## 2017-03-11 DIAGNOSIS — R412 Retrograde amnesia: Secondary | ICD-10-CM | POA: Diagnosis not present

## 2017-03-11 DIAGNOSIS — F419 Anxiety disorder, unspecified: Secondary | ICD-10-CM | POA: Diagnosis not present

## 2017-03-11 DIAGNOSIS — I10 Essential (primary) hypertension: Secondary | ICD-10-CM | POA: Diagnosis not present

## 2017-03-12 DIAGNOSIS — R412 Retrograde amnesia: Secondary | ICD-10-CM | POA: Diagnosis not present

## 2017-03-12 DIAGNOSIS — M810 Age-related osteoporosis without current pathological fracture: Secondary | ICD-10-CM | POA: Diagnosis not present

## 2017-03-12 DIAGNOSIS — I10 Essential (primary) hypertension: Secondary | ICD-10-CM | POA: Diagnosis not present

## 2017-03-12 DIAGNOSIS — F419 Anxiety disorder, unspecified: Secondary | ICD-10-CM | POA: Diagnosis not present

## 2017-03-13 DIAGNOSIS — F419 Anxiety disorder, unspecified: Secondary | ICD-10-CM | POA: Diagnosis not present

## 2017-03-13 DIAGNOSIS — M810 Age-related osteoporosis without current pathological fracture: Secondary | ICD-10-CM | POA: Diagnosis not present

## 2017-03-13 DIAGNOSIS — I10 Essential (primary) hypertension: Secondary | ICD-10-CM | POA: Diagnosis not present

## 2017-03-13 DIAGNOSIS — R412 Retrograde amnesia: Secondary | ICD-10-CM | POA: Diagnosis not present

## 2017-03-14 DIAGNOSIS — I10 Essential (primary) hypertension: Secondary | ICD-10-CM | POA: Diagnosis not present

## 2017-03-14 DIAGNOSIS — F419 Anxiety disorder, unspecified: Secondary | ICD-10-CM | POA: Diagnosis not present

## 2017-03-14 DIAGNOSIS — M810 Age-related osteoporosis without current pathological fracture: Secondary | ICD-10-CM | POA: Diagnosis not present

## 2017-03-14 DIAGNOSIS — R412 Retrograde amnesia: Secondary | ICD-10-CM | POA: Diagnosis not present

## 2017-03-15 DIAGNOSIS — R412 Retrograde amnesia: Secondary | ICD-10-CM | POA: Diagnosis not present

## 2017-03-15 DIAGNOSIS — M810 Age-related osteoporosis without current pathological fracture: Secondary | ICD-10-CM | POA: Diagnosis not present

## 2017-03-15 DIAGNOSIS — I10 Essential (primary) hypertension: Secondary | ICD-10-CM | POA: Diagnosis not present

## 2017-03-15 DIAGNOSIS — F419 Anxiety disorder, unspecified: Secondary | ICD-10-CM | POA: Diagnosis not present

## 2017-03-16 DIAGNOSIS — M810 Age-related osteoporosis without current pathological fracture: Secondary | ICD-10-CM | POA: Diagnosis not present

## 2017-03-16 DIAGNOSIS — R412 Retrograde amnesia: Secondary | ICD-10-CM | POA: Diagnosis not present

## 2017-03-16 DIAGNOSIS — I10 Essential (primary) hypertension: Secondary | ICD-10-CM | POA: Diagnosis not present

## 2017-03-16 DIAGNOSIS — F419 Anxiety disorder, unspecified: Secondary | ICD-10-CM | POA: Diagnosis not present

## 2017-03-17 DIAGNOSIS — M810 Age-related osteoporosis without current pathological fracture: Secondary | ICD-10-CM | POA: Diagnosis not present

## 2017-03-17 DIAGNOSIS — R412 Retrograde amnesia: Secondary | ICD-10-CM | POA: Diagnosis not present

## 2017-03-17 DIAGNOSIS — I10 Essential (primary) hypertension: Secondary | ICD-10-CM | POA: Diagnosis not present

## 2017-03-17 DIAGNOSIS — F419 Anxiety disorder, unspecified: Secondary | ICD-10-CM | POA: Diagnosis not present

## 2017-03-18 DIAGNOSIS — I10 Essential (primary) hypertension: Secondary | ICD-10-CM | POA: Diagnosis not present

## 2017-03-18 DIAGNOSIS — M810 Age-related osteoporosis without current pathological fracture: Secondary | ICD-10-CM | POA: Diagnosis not present

## 2017-03-18 DIAGNOSIS — F419 Anxiety disorder, unspecified: Secondary | ICD-10-CM | POA: Diagnosis not present

## 2017-03-18 DIAGNOSIS — Z6825 Body mass index (BMI) 25.0-25.9, adult: Secondary | ICD-10-CM | POA: Diagnosis not present

## 2017-03-18 DIAGNOSIS — R412 Retrograde amnesia: Secondary | ICD-10-CM | POA: Diagnosis not present

## 2017-03-18 DIAGNOSIS — R600 Localized edema: Secondary | ICD-10-CM | POA: Diagnosis not present

## 2017-03-18 DIAGNOSIS — K219 Gastro-esophageal reflux disease without esophagitis: Secondary | ICD-10-CM | POA: Diagnosis not present

## 2017-03-19 DIAGNOSIS — M810 Age-related osteoporosis without current pathological fracture: Secondary | ICD-10-CM | POA: Diagnosis not present

## 2017-03-19 DIAGNOSIS — R412 Retrograde amnesia: Secondary | ICD-10-CM | POA: Diagnosis not present

## 2017-03-19 DIAGNOSIS — I10 Essential (primary) hypertension: Secondary | ICD-10-CM | POA: Diagnosis not present

## 2017-03-19 DIAGNOSIS — F419 Anxiety disorder, unspecified: Secondary | ICD-10-CM | POA: Diagnosis not present

## 2017-03-20 DIAGNOSIS — F419 Anxiety disorder, unspecified: Secondary | ICD-10-CM | POA: Diagnosis not present

## 2017-03-20 DIAGNOSIS — M810 Age-related osteoporosis without current pathological fracture: Secondary | ICD-10-CM | POA: Diagnosis not present

## 2017-03-20 DIAGNOSIS — I10 Essential (primary) hypertension: Secondary | ICD-10-CM | POA: Diagnosis not present

## 2017-03-20 DIAGNOSIS — R412 Retrograde amnesia: Secondary | ICD-10-CM | POA: Diagnosis not present

## 2017-03-21 DIAGNOSIS — F419 Anxiety disorder, unspecified: Secondary | ICD-10-CM | POA: Diagnosis not present

## 2017-03-21 DIAGNOSIS — M810 Age-related osteoporosis without current pathological fracture: Secondary | ICD-10-CM | POA: Diagnosis not present

## 2017-03-21 DIAGNOSIS — I10 Essential (primary) hypertension: Secondary | ICD-10-CM | POA: Diagnosis not present

## 2017-03-21 DIAGNOSIS — R412 Retrograde amnesia: Secondary | ICD-10-CM | POA: Diagnosis not present

## 2017-03-22 DIAGNOSIS — R412 Retrograde amnesia: Secondary | ICD-10-CM | POA: Diagnosis not present

## 2017-03-22 DIAGNOSIS — I10 Essential (primary) hypertension: Secondary | ICD-10-CM | POA: Diagnosis not present

## 2017-03-22 DIAGNOSIS — F419 Anxiety disorder, unspecified: Secondary | ICD-10-CM | POA: Diagnosis not present

## 2017-03-22 DIAGNOSIS — M810 Age-related osteoporosis without current pathological fracture: Secondary | ICD-10-CM | POA: Diagnosis not present

## 2017-03-23 DIAGNOSIS — I10 Essential (primary) hypertension: Secondary | ICD-10-CM | POA: Diagnosis not present

## 2017-03-23 DIAGNOSIS — M810 Age-related osteoporosis without current pathological fracture: Secondary | ICD-10-CM | POA: Diagnosis not present

## 2017-03-23 DIAGNOSIS — F419 Anxiety disorder, unspecified: Secondary | ICD-10-CM | POA: Diagnosis not present

## 2017-03-23 DIAGNOSIS — R412 Retrograde amnesia: Secondary | ICD-10-CM | POA: Diagnosis not present

## 2017-03-24 DIAGNOSIS — M810 Age-related osteoporosis without current pathological fracture: Secondary | ICD-10-CM | POA: Diagnosis not present

## 2017-03-24 DIAGNOSIS — I10 Essential (primary) hypertension: Secondary | ICD-10-CM | POA: Diagnosis not present

## 2017-03-24 DIAGNOSIS — R412 Retrograde amnesia: Secondary | ICD-10-CM | POA: Diagnosis not present

## 2017-03-24 DIAGNOSIS — F419 Anxiety disorder, unspecified: Secondary | ICD-10-CM | POA: Diagnosis not present

## 2017-03-25 DIAGNOSIS — I10 Essential (primary) hypertension: Secondary | ICD-10-CM | POA: Diagnosis not present

## 2017-03-25 DIAGNOSIS — F419 Anxiety disorder, unspecified: Secondary | ICD-10-CM | POA: Diagnosis not present

## 2017-03-25 DIAGNOSIS — M810 Age-related osteoporosis without current pathological fracture: Secondary | ICD-10-CM | POA: Diagnosis not present

## 2017-03-25 DIAGNOSIS — R412 Retrograde amnesia: Secondary | ICD-10-CM | POA: Diagnosis not present

## 2017-03-26 DIAGNOSIS — F419 Anxiety disorder, unspecified: Secondary | ICD-10-CM | POA: Diagnosis not present

## 2017-03-26 DIAGNOSIS — I10 Essential (primary) hypertension: Secondary | ICD-10-CM | POA: Diagnosis not present

## 2017-03-26 DIAGNOSIS — R412 Retrograde amnesia: Secondary | ICD-10-CM | POA: Diagnosis not present

## 2017-03-26 DIAGNOSIS — M810 Age-related osteoporosis without current pathological fracture: Secondary | ICD-10-CM | POA: Diagnosis not present

## 2017-03-27 DIAGNOSIS — M810 Age-related osteoporosis without current pathological fracture: Secondary | ICD-10-CM | POA: Diagnosis not present

## 2017-03-27 DIAGNOSIS — R412 Retrograde amnesia: Secondary | ICD-10-CM | POA: Diagnosis not present

## 2017-03-27 DIAGNOSIS — I10 Essential (primary) hypertension: Secondary | ICD-10-CM | POA: Diagnosis not present

## 2017-03-27 DIAGNOSIS — F419 Anxiety disorder, unspecified: Secondary | ICD-10-CM | POA: Diagnosis not present

## 2017-03-28 DIAGNOSIS — I10 Essential (primary) hypertension: Secondary | ICD-10-CM | POA: Diagnosis not present

## 2017-03-28 DIAGNOSIS — F419 Anxiety disorder, unspecified: Secondary | ICD-10-CM | POA: Diagnosis not present

## 2017-03-28 DIAGNOSIS — R412 Retrograde amnesia: Secondary | ICD-10-CM | POA: Diagnosis not present

## 2017-03-28 DIAGNOSIS — M810 Age-related osteoporosis without current pathological fracture: Secondary | ICD-10-CM | POA: Diagnosis not present

## 2017-03-29 DIAGNOSIS — M810 Age-related osteoporosis without current pathological fracture: Secondary | ICD-10-CM | POA: Diagnosis not present

## 2017-03-29 DIAGNOSIS — F419 Anxiety disorder, unspecified: Secondary | ICD-10-CM | POA: Diagnosis not present

## 2017-03-29 DIAGNOSIS — R412 Retrograde amnesia: Secondary | ICD-10-CM | POA: Diagnosis not present

## 2017-03-29 DIAGNOSIS — I10 Essential (primary) hypertension: Secondary | ICD-10-CM | POA: Diagnosis not present

## 2017-03-30 DIAGNOSIS — F419 Anxiety disorder, unspecified: Secondary | ICD-10-CM | POA: Diagnosis not present

## 2017-03-30 DIAGNOSIS — I10 Essential (primary) hypertension: Secondary | ICD-10-CM | POA: Diagnosis not present

## 2017-03-30 DIAGNOSIS — R412 Retrograde amnesia: Secondary | ICD-10-CM | POA: Diagnosis not present

## 2017-03-30 DIAGNOSIS — M810 Age-related osteoporosis without current pathological fracture: Secondary | ICD-10-CM | POA: Diagnosis not present

## 2017-03-31 DIAGNOSIS — I10 Essential (primary) hypertension: Secondary | ICD-10-CM | POA: Diagnosis not present

## 2017-03-31 DIAGNOSIS — M810 Age-related osteoporosis without current pathological fracture: Secondary | ICD-10-CM | POA: Diagnosis not present

## 2017-03-31 DIAGNOSIS — F419 Anxiety disorder, unspecified: Secondary | ICD-10-CM | POA: Diagnosis not present

## 2017-03-31 DIAGNOSIS — R412 Retrograde amnesia: Secondary | ICD-10-CM | POA: Diagnosis not present

## 2017-04-01 DIAGNOSIS — I10 Essential (primary) hypertension: Secondary | ICD-10-CM | POA: Diagnosis not present

## 2017-04-01 DIAGNOSIS — F419 Anxiety disorder, unspecified: Secondary | ICD-10-CM | POA: Diagnosis not present

## 2017-04-01 DIAGNOSIS — R412 Retrograde amnesia: Secondary | ICD-10-CM | POA: Diagnosis not present

## 2017-04-01 DIAGNOSIS — M810 Age-related osteoporosis without current pathological fracture: Secondary | ICD-10-CM | POA: Diagnosis not present

## 2017-04-02 DIAGNOSIS — F419 Anxiety disorder, unspecified: Secondary | ICD-10-CM | POA: Diagnosis not present

## 2017-04-02 DIAGNOSIS — I10 Essential (primary) hypertension: Secondary | ICD-10-CM | POA: Diagnosis not present

## 2017-04-02 DIAGNOSIS — R412 Retrograde amnesia: Secondary | ICD-10-CM | POA: Diagnosis not present

## 2017-04-02 DIAGNOSIS — M810 Age-related osteoporosis without current pathological fracture: Secondary | ICD-10-CM | POA: Diagnosis not present

## 2017-04-03 DIAGNOSIS — I10 Essential (primary) hypertension: Secondary | ICD-10-CM | POA: Diagnosis not present

## 2017-04-03 DIAGNOSIS — R412 Retrograde amnesia: Secondary | ICD-10-CM | POA: Diagnosis not present

## 2017-04-03 DIAGNOSIS — M810 Age-related osteoporosis without current pathological fracture: Secondary | ICD-10-CM | POA: Diagnosis not present

## 2017-04-03 DIAGNOSIS — F419 Anxiety disorder, unspecified: Secondary | ICD-10-CM | POA: Diagnosis not present

## 2017-04-04 DIAGNOSIS — M810 Age-related osteoporosis without current pathological fracture: Secondary | ICD-10-CM | POA: Diagnosis not present

## 2017-04-04 DIAGNOSIS — F419 Anxiety disorder, unspecified: Secondary | ICD-10-CM | POA: Diagnosis not present

## 2017-04-04 DIAGNOSIS — I10 Essential (primary) hypertension: Secondary | ICD-10-CM | POA: Diagnosis not present

## 2017-04-04 DIAGNOSIS — R412 Retrograde amnesia: Secondary | ICD-10-CM | POA: Diagnosis not present

## 2017-04-05 DIAGNOSIS — R412 Retrograde amnesia: Secondary | ICD-10-CM | POA: Diagnosis not present

## 2017-04-05 DIAGNOSIS — F419 Anxiety disorder, unspecified: Secondary | ICD-10-CM | POA: Diagnosis not present

## 2017-04-05 DIAGNOSIS — M810 Age-related osteoporosis without current pathological fracture: Secondary | ICD-10-CM | POA: Diagnosis not present

## 2017-04-05 DIAGNOSIS — I10 Essential (primary) hypertension: Secondary | ICD-10-CM | POA: Diagnosis not present

## 2017-04-06 DIAGNOSIS — F419 Anxiety disorder, unspecified: Secondary | ICD-10-CM | POA: Diagnosis not present

## 2017-04-06 DIAGNOSIS — M810 Age-related osteoporosis without current pathological fracture: Secondary | ICD-10-CM | POA: Diagnosis not present

## 2017-04-06 DIAGNOSIS — R412 Retrograde amnesia: Secondary | ICD-10-CM | POA: Diagnosis not present

## 2017-04-06 DIAGNOSIS — I10 Essential (primary) hypertension: Secondary | ICD-10-CM | POA: Diagnosis not present

## 2017-04-07 DIAGNOSIS — F419 Anxiety disorder, unspecified: Secondary | ICD-10-CM | POA: Diagnosis not present

## 2017-04-07 DIAGNOSIS — I10 Essential (primary) hypertension: Secondary | ICD-10-CM | POA: Diagnosis not present

## 2017-04-07 DIAGNOSIS — M810 Age-related osteoporosis without current pathological fracture: Secondary | ICD-10-CM | POA: Diagnosis not present

## 2017-04-07 DIAGNOSIS — R412 Retrograde amnesia: Secondary | ICD-10-CM | POA: Diagnosis not present

## 2017-04-08 DIAGNOSIS — I10 Essential (primary) hypertension: Secondary | ICD-10-CM | POA: Diagnosis not present

## 2017-04-08 DIAGNOSIS — M810 Age-related osteoporosis without current pathological fracture: Secondary | ICD-10-CM | POA: Diagnosis not present

## 2017-04-08 DIAGNOSIS — R412 Retrograde amnesia: Secondary | ICD-10-CM | POA: Diagnosis not present

## 2017-04-08 DIAGNOSIS — F419 Anxiety disorder, unspecified: Secondary | ICD-10-CM | POA: Diagnosis not present

## 2017-04-09 DIAGNOSIS — I10 Essential (primary) hypertension: Secondary | ICD-10-CM | POA: Diagnosis not present

## 2017-04-09 DIAGNOSIS — R412 Retrograde amnesia: Secondary | ICD-10-CM | POA: Diagnosis not present

## 2017-04-09 DIAGNOSIS — M810 Age-related osteoporosis without current pathological fracture: Secondary | ICD-10-CM | POA: Diagnosis not present

## 2017-04-09 DIAGNOSIS — F419 Anxiety disorder, unspecified: Secondary | ICD-10-CM | POA: Diagnosis not present

## 2017-04-10 DIAGNOSIS — F419 Anxiety disorder, unspecified: Secondary | ICD-10-CM | POA: Diagnosis not present

## 2017-04-10 DIAGNOSIS — R412 Retrograde amnesia: Secondary | ICD-10-CM | POA: Diagnosis not present

## 2017-04-10 DIAGNOSIS — I10 Essential (primary) hypertension: Secondary | ICD-10-CM | POA: Diagnosis not present

## 2017-04-10 DIAGNOSIS — M810 Age-related osteoporosis without current pathological fracture: Secondary | ICD-10-CM | POA: Diagnosis not present

## 2017-04-11 DIAGNOSIS — M810 Age-related osteoporosis without current pathological fracture: Secondary | ICD-10-CM | POA: Diagnosis not present

## 2017-04-11 DIAGNOSIS — R412 Retrograde amnesia: Secondary | ICD-10-CM | POA: Diagnosis not present

## 2017-04-11 DIAGNOSIS — I10 Essential (primary) hypertension: Secondary | ICD-10-CM | POA: Diagnosis not present

## 2017-04-11 DIAGNOSIS — F419 Anxiety disorder, unspecified: Secondary | ICD-10-CM | POA: Diagnosis not present

## 2017-04-12 DIAGNOSIS — M810 Age-related osteoporosis without current pathological fracture: Secondary | ICD-10-CM | POA: Diagnosis not present

## 2017-04-12 DIAGNOSIS — F419 Anxiety disorder, unspecified: Secondary | ICD-10-CM | POA: Diagnosis not present

## 2017-04-12 DIAGNOSIS — I10 Essential (primary) hypertension: Secondary | ICD-10-CM | POA: Diagnosis not present

## 2017-04-12 DIAGNOSIS — R412 Retrograde amnesia: Secondary | ICD-10-CM | POA: Diagnosis not present

## 2017-04-13 DIAGNOSIS — R412 Retrograde amnesia: Secondary | ICD-10-CM | POA: Diagnosis not present

## 2017-04-13 DIAGNOSIS — F419 Anxiety disorder, unspecified: Secondary | ICD-10-CM | POA: Diagnosis not present

## 2017-04-13 DIAGNOSIS — I10 Essential (primary) hypertension: Secondary | ICD-10-CM | POA: Diagnosis not present

## 2017-04-13 DIAGNOSIS — M810 Age-related osteoporosis without current pathological fracture: Secondary | ICD-10-CM | POA: Diagnosis not present

## 2017-04-14 DIAGNOSIS — F419 Anxiety disorder, unspecified: Secondary | ICD-10-CM | POA: Diagnosis not present

## 2017-04-14 DIAGNOSIS — M810 Age-related osteoporosis without current pathological fracture: Secondary | ICD-10-CM | POA: Diagnosis not present

## 2017-04-14 DIAGNOSIS — I10 Essential (primary) hypertension: Secondary | ICD-10-CM | POA: Diagnosis not present

## 2017-04-14 DIAGNOSIS — R412 Retrograde amnesia: Secondary | ICD-10-CM | POA: Diagnosis not present

## 2017-04-15 DIAGNOSIS — R412 Retrograde amnesia: Secondary | ICD-10-CM | POA: Diagnosis not present

## 2017-04-15 DIAGNOSIS — M810 Age-related osteoporosis without current pathological fracture: Secondary | ICD-10-CM | POA: Diagnosis not present

## 2017-04-15 DIAGNOSIS — I10 Essential (primary) hypertension: Secondary | ICD-10-CM | POA: Diagnosis not present

## 2017-04-15 DIAGNOSIS — F419 Anxiety disorder, unspecified: Secondary | ICD-10-CM | POA: Diagnosis not present

## 2017-04-16 DIAGNOSIS — M810 Age-related osteoporosis without current pathological fracture: Secondary | ICD-10-CM | POA: Diagnosis not present

## 2017-04-16 DIAGNOSIS — R412 Retrograde amnesia: Secondary | ICD-10-CM | POA: Diagnosis not present

## 2017-04-16 DIAGNOSIS — F419 Anxiety disorder, unspecified: Secondary | ICD-10-CM | POA: Diagnosis not present

## 2017-04-16 DIAGNOSIS — I10 Essential (primary) hypertension: Secondary | ICD-10-CM | POA: Diagnosis not present

## 2017-04-17 DIAGNOSIS — I10 Essential (primary) hypertension: Secondary | ICD-10-CM | POA: Diagnosis not present

## 2017-04-17 DIAGNOSIS — M810 Age-related osteoporosis without current pathological fracture: Secondary | ICD-10-CM | POA: Diagnosis not present

## 2017-04-17 DIAGNOSIS — R412 Retrograde amnesia: Secondary | ICD-10-CM | POA: Diagnosis not present

## 2017-04-17 DIAGNOSIS — F419 Anxiety disorder, unspecified: Secondary | ICD-10-CM | POA: Diagnosis not present

## 2017-04-18 DIAGNOSIS — F419 Anxiety disorder, unspecified: Secondary | ICD-10-CM | POA: Diagnosis not present

## 2017-04-18 DIAGNOSIS — I10 Essential (primary) hypertension: Secondary | ICD-10-CM | POA: Diagnosis not present

## 2017-04-18 DIAGNOSIS — R412 Retrograde amnesia: Secondary | ICD-10-CM | POA: Diagnosis not present

## 2017-04-18 DIAGNOSIS — M810 Age-related osteoporosis without current pathological fracture: Secondary | ICD-10-CM | POA: Diagnosis not present

## 2017-04-19 DIAGNOSIS — F419 Anxiety disorder, unspecified: Secondary | ICD-10-CM | POA: Diagnosis not present

## 2017-04-19 DIAGNOSIS — R412 Retrograde amnesia: Secondary | ICD-10-CM | POA: Diagnosis not present

## 2017-04-19 DIAGNOSIS — I10 Essential (primary) hypertension: Secondary | ICD-10-CM | POA: Diagnosis not present

## 2017-04-19 DIAGNOSIS — M810 Age-related osteoporosis without current pathological fracture: Secondary | ICD-10-CM | POA: Diagnosis not present

## 2017-04-20 DIAGNOSIS — M810 Age-related osteoporosis without current pathological fracture: Secondary | ICD-10-CM | POA: Diagnosis not present

## 2017-04-20 DIAGNOSIS — R412 Retrograde amnesia: Secondary | ICD-10-CM | POA: Diagnosis not present

## 2017-04-20 DIAGNOSIS — F419 Anxiety disorder, unspecified: Secondary | ICD-10-CM | POA: Diagnosis not present

## 2017-04-20 DIAGNOSIS — I10 Essential (primary) hypertension: Secondary | ICD-10-CM | POA: Diagnosis not present

## 2017-04-21 DIAGNOSIS — R412 Retrograde amnesia: Secondary | ICD-10-CM | POA: Diagnosis not present

## 2017-04-21 DIAGNOSIS — F419 Anxiety disorder, unspecified: Secondary | ICD-10-CM | POA: Diagnosis not present

## 2017-04-21 DIAGNOSIS — I10 Essential (primary) hypertension: Secondary | ICD-10-CM | POA: Diagnosis not present

## 2017-04-21 DIAGNOSIS — M810 Age-related osteoporosis without current pathological fracture: Secondary | ICD-10-CM | POA: Diagnosis not present

## 2017-04-22 DIAGNOSIS — F419 Anxiety disorder, unspecified: Secondary | ICD-10-CM | POA: Diagnosis not present

## 2017-04-22 DIAGNOSIS — R412 Retrograde amnesia: Secondary | ICD-10-CM | POA: Diagnosis not present

## 2017-04-22 DIAGNOSIS — M810 Age-related osteoporosis without current pathological fracture: Secondary | ICD-10-CM | POA: Diagnosis not present

## 2017-04-22 DIAGNOSIS — I10 Essential (primary) hypertension: Secondary | ICD-10-CM | POA: Diagnosis not present

## 2017-04-23 DIAGNOSIS — I10 Essential (primary) hypertension: Secondary | ICD-10-CM | POA: Diagnosis not present

## 2017-04-23 DIAGNOSIS — M810 Age-related osteoporosis without current pathological fracture: Secondary | ICD-10-CM | POA: Diagnosis not present

## 2017-04-23 DIAGNOSIS — R412 Retrograde amnesia: Secondary | ICD-10-CM | POA: Diagnosis not present

## 2017-04-23 DIAGNOSIS — F419 Anxiety disorder, unspecified: Secondary | ICD-10-CM | POA: Diagnosis not present

## 2017-04-24 DIAGNOSIS — I10 Essential (primary) hypertension: Secondary | ICD-10-CM | POA: Diagnosis not present

## 2017-04-24 DIAGNOSIS — F419 Anxiety disorder, unspecified: Secondary | ICD-10-CM | POA: Diagnosis not present

## 2017-04-24 DIAGNOSIS — R412 Retrograde amnesia: Secondary | ICD-10-CM | POA: Diagnosis not present

## 2017-04-24 DIAGNOSIS — M810 Age-related osteoporosis without current pathological fracture: Secondary | ICD-10-CM | POA: Diagnosis not present

## 2017-04-25 DIAGNOSIS — I10 Essential (primary) hypertension: Secondary | ICD-10-CM | POA: Diagnosis not present

## 2017-04-25 DIAGNOSIS — M810 Age-related osteoporosis without current pathological fracture: Secondary | ICD-10-CM | POA: Diagnosis not present

## 2017-04-25 DIAGNOSIS — R412 Retrograde amnesia: Secondary | ICD-10-CM | POA: Diagnosis not present

## 2017-04-25 DIAGNOSIS — F419 Anxiety disorder, unspecified: Secondary | ICD-10-CM | POA: Diagnosis not present

## 2017-04-26 DIAGNOSIS — I10 Essential (primary) hypertension: Secondary | ICD-10-CM | POA: Diagnosis not present

## 2017-04-26 DIAGNOSIS — R412 Retrograde amnesia: Secondary | ICD-10-CM | POA: Diagnosis not present

## 2017-04-26 DIAGNOSIS — M810 Age-related osteoporosis without current pathological fracture: Secondary | ICD-10-CM | POA: Diagnosis not present

## 2017-04-26 DIAGNOSIS — F419 Anxiety disorder, unspecified: Secondary | ICD-10-CM | POA: Diagnosis not present

## 2017-04-27 DIAGNOSIS — R412 Retrograde amnesia: Secondary | ICD-10-CM | POA: Diagnosis not present

## 2017-04-27 DIAGNOSIS — I10 Essential (primary) hypertension: Secondary | ICD-10-CM | POA: Diagnosis not present

## 2017-04-27 DIAGNOSIS — F419 Anxiety disorder, unspecified: Secondary | ICD-10-CM | POA: Diagnosis not present

## 2017-04-27 DIAGNOSIS — M810 Age-related osteoporosis without current pathological fracture: Secondary | ICD-10-CM | POA: Diagnosis not present

## 2017-04-28 DIAGNOSIS — M810 Age-related osteoporosis without current pathological fracture: Secondary | ICD-10-CM | POA: Diagnosis not present

## 2017-04-28 DIAGNOSIS — F419 Anxiety disorder, unspecified: Secondary | ICD-10-CM | POA: Diagnosis not present

## 2017-04-28 DIAGNOSIS — R412 Retrograde amnesia: Secondary | ICD-10-CM | POA: Diagnosis not present

## 2017-04-28 DIAGNOSIS — I10 Essential (primary) hypertension: Secondary | ICD-10-CM | POA: Diagnosis not present

## 2017-04-29 DIAGNOSIS — R412 Retrograde amnesia: Secondary | ICD-10-CM | POA: Diagnosis not present

## 2017-04-29 DIAGNOSIS — M810 Age-related osteoporosis without current pathological fracture: Secondary | ICD-10-CM | POA: Diagnosis not present

## 2017-04-29 DIAGNOSIS — I10 Essential (primary) hypertension: Secondary | ICD-10-CM | POA: Diagnosis not present

## 2017-04-29 DIAGNOSIS — F419 Anxiety disorder, unspecified: Secondary | ICD-10-CM | POA: Diagnosis not present

## 2017-04-30 DIAGNOSIS — R412 Retrograde amnesia: Secondary | ICD-10-CM | POA: Diagnosis not present

## 2017-04-30 DIAGNOSIS — I10 Essential (primary) hypertension: Secondary | ICD-10-CM | POA: Diagnosis not present

## 2017-04-30 DIAGNOSIS — M810 Age-related osteoporosis without current pathological fracture: Secondary | ICD-10-CM | POA: Diagnosis not present

## 2017-04-30 DIAGNOSIS — F419 Anxiety disorder, unspecified: Secondary | ICD-10-CM | POA: Diagnosis not present

## 2017-05-01 DIAGNOSIS — I10 Essential (primary) hypertension: Secondary | ICD-10-CM | POA: Diagnosis not present

## 2017-05-01 DIAGNOSIS — M810 Age-related osteoporosis without current pathological fracture: Secondary | ICD-10-CM | POA: Diagnosis not present

## 2017-05-01 DIAGNOSIS — R412 Retrograde amnesia: Secondary | ICD-10-CM | POA: Diagnosis not present

## 2017-05-01 DIAGNOSIS — F419 Anxiety disorder, unspecified: Secondary | ICD-10-CM | POA: Diagnosis not present

## 2017-05-02 DIAGNOSIS — R412 Retrograde amnesia: Secondary | ICD-10-CM | POA: Diagnosis not present

## 2017-05-02 DIAGNOSIS — F419 Anxiety disorder, unspecified: Secondary | ICD-10-CM | POA: Diagnosis not present

## 2017-05-02 DIAGNOSIS — M810 Age-related osteoporosis without current pathological fracture: Secondary | ICD-10-CM | POA: Diagnosis not present

## 2017-05-02 DIAGNOSIS — I10 Essential (primary) hypertension: Secondary | ICD-10-CM | POA: Diagnosis not present

## 2017-05-03 DIAGNOSIS — M810 Age-related osteoporosis without current pathological fracture: Secondary | ICD-10-CM | POA: Diagnosis not present

## 2017-05-03 DIAGNOSIS — F419 Anxiety disorder, unspecified: Secondary | ICD-10-CM | POA: Diagnosis not present

## 2017-05-03 DIAGNOSIS — I10 Essential (primary) hypertension: Secondary | ICD-10-CM | POA: Diagnosis not present

## 2017-05-03 DIAGNOSIS — R412 Retrograde amnesia: Secondary | ICD-10-CM | POA: Diagnosis not present

## 2017-05-04 DIAGNOSIS — I10 Essential (primary) hypertension: Secondary | ICD-10-CM | POA: Diagnosis not present

## 2017-05-04 DIAGNOSIS — F419 Anxiety disorder, unspecified: Secondary | ICD-10-CM | POA: Diagnosis not present

## 2017-05-04 DIAGNOSIS — M810 Age-related osteoporosis without current pathological fracture: Secondary | ICD-10-CM | POA: Diagnosis not present

## 2017-05-04 DIAGNOSIS — R412 Retrograde amnesia: Secondary | ICD-10-CM | POA: Diagnosis not present

## 2017-05-05 DIAGNOSIS — F419 Anxiety disorder, unspecified: Secondary | ICD-10-CM | POA: Diagnosis not present

## 2017-05-05 DIAGNOSIS — M810 Age-related osteoporosis without current pathological fracture: Secondary | ICD-10-CM | POA: Diagnosis not present

## 2017-05-05 DIAGNOSIS — I10 Essential (primary) hypertension: Secondary | ICD-10-CM | POA: Diagnosis not present

## 2017-05-05 DIAGNOSIS — R412 Retrograde amnesia: Secondary | ICD-10-CM | POA: Diagnosis not present

## 2017-05-06 DIAGNOSIS — I10 Essential (primary) hypertension: Secondary | ICD-10-CM | POA: Diagnosis not present

## 2017-05-06 DIAGNOSIS — M810 Age-related osteoporosis without current pathological fracture: Secondary | ICD-10-CM | POA: Diagnosis not present

## 2017-05-06 DIAGNOSIS — R412 Retrograde amnesia: Secondary | ICD-10-CM | POA: Diagnosis not present

## 2017-05-06 DIAGNOSIS — F419 Anxiety disorder, unspecified: Secondary | ICD-10-CM | POA: Diagnosis not present

## 2017-05-07 DIAGNOSIS — F419 Anxiety disorder, unspecified: Secondary | ICD-10-CM | POA: Diagnosis not present

## 2017-05-07 DIAGNOSIS — M810 Age-related osteoporosis without current pathological fracture: Secondary | ICD-10-CM | POA: Diagnosis not present

## 2017-05-07 DIAGNOSIS — R412 Retrograde amnesia: Secondary | ICD-10-CM | POA: Diagnosis not present

## 2017-05-07 DIAGNOSIS — I10 Essential (primary) hypertension: Secondary | ICD-10-CM | POA: Diagnosis not present

## 2017-05-08 DIAGNOSIS — R412 Retrograde amnesia: Secondary | ICD-10-CM | POA: Diagnosis not present

## 2017-05-08 DIAGNOSIS — I10 Essential (primary) hypertension: Secondary | ICD-10-CM | POA: Diagnosis not present

## 2017-05-08 DIAGNOSIS — F419 Anxiety disorder, unspecified: Secondary | ICD-10-CM | POA: Diagnosis not present

## 2017-05-08 DIAGNOSIS — M810 Age-related osteoporosis without current pathological fracture: Secondary | ICD-10-CM | POA: Diagnosis not present

## 2017-05-09 DIAGNOSIS — R412 Retrograde amnesia: Secondary | ICD-10-CM | POA: Diagnosis not present

## 2017-05-09 DIAGNOSIS — I10 Essential (primary) hypertension: Secondary | ICD-10-CM | POA: Diagnosis not present

## 2017-05-09 DIAGNOSIS — F419 Anxiety disorder, unspecified: Secondary | ICD-10-CM | POA: Diagnosis not present

## 2017-05-09 DIAGNOSIS — M810 Age-related osteoporosis without current pathological fracture: Secondary | ICD-10-CM | POA: Diagnosis not present

## 2017-05-10 DIAGNOSIS — F419 Anxiety disorder, unspecified: Secondary | ICD-10-CM | POA: Diagnosis not present

## 2017-05-10 DIAGNOSIS — R412 Retrograde amnesia: Secondary | ICD-10-CM | POA: Diagnosis not present

## 2017-05-10 DIAGNOSIS — M810 Age-related osteoporosis without current pathological fracture: Secondary | ICD-10-CM | POA: Diagnosis not present

## 2017-05-10 DIAGNOSIS — I10 Essential (primary) hypertension: Secondary | ICD-10-CM | POA: Diagnosis not present

## 2017-05-11 DIAGNOSIS — I10 Essential (primary) hypertension: Secondary | ICD-10-CM | POA: Diagnosis not present

## 2017-05-11 DIAGNOSIS — R412 Retrograde amnesia: Secondary | ICD-10-CM | POA: Diagnosis not present

## 2017-05-11 DIAGNOSIS — M810 Age-related osteoporosis without current pathological fracture: Secondary | ICD-10-CM | POA: Diagnosis not present

## 2017-05-11 DIAGNOSIS — F419 Anxiety disorder, unspecified: Secondary | ICD-10-CM | POA: Diagnosis not present

## 2017-05-12 DIAGNOSIS — M810 Age-related osteoporosis without current pathological fracture: Secondary | ICD-10-CM | POA: Diagnosis not present

## 2017-05-12 DIAGNOSIS — F419 Anxiety disorder, unspecified: Secondary | ICD-10-CM | POA: Diagnosis not present

## 2017-05-12 DIAGNOSIS — I10 Essential (primary) hypertension: Secondary | ICD-10-CM | POA: Diagnosis not present

## 2017-05-12 DIAGNOSIS — R412 Retrograde amnesia: Secondary | ICD-10-CM | POA: Diagnosis not present

## 2017-05-13 DIAGNOSIS — F419 Anxiety disorder, unspecified: Secondary | ICD-10-CM | POA: Diagnosis not present

## 2017-05-13 DIAGNOSIS — M810 Age-related osteoporosis without current pathological fracture: Secondary | ICD-10-CM | POA: Diagnosis not present

## 2017-05-13 DIAGNOSIS — R412 Retrograde amnesia: Secondary | ICD-10-CM | POA: Diagnosis not present

## 2017-05-13 DIAGNOSIS — I10 Essential (primary) hypertension: Secondary | ICD-10-CM | POA: Diagnosis not present

## 2017-05-14 DIAGNOSIS — M810 Age-related osteoporosis without current pathological fracture: Secondary | ICD-10-CM | POA: Diagnosis not present

## 2017-05-14 DIAGNOSIS — I10 Essential (primary) hypertension: Secondary | ICD-10-CM | POA: Diagnosis not present

## 2017-05-14 DIAGNOSIS — F419 Anxiety disorder, unspecified: Secondary | ICD-10-CM | POA: Diagnosis not present

## 2017-05-14 DIAGNOSIS — R412 Retrograde amnesia: Secondary | ICD-10-CM | POA: Diagnosis not present

## 2017-05-15 DIAGNOSIS — F419 Anxiety disorder, unspecified: Secondary | ICD-10-CM | POA: Diagnosis not present

## 2017-05-15 DIAGNOSIS — R412 Retrograde amnesia: Secondary | ICD-10-CM | POA: Diagnosis not present

## 2017-05-15 DIAGNOSIS — I10 Essential (primary) hypertension: Secondary | ICD-10-CM | POA: Diagnosis not present

## 2017-05-15 DIAGNOSIS — M810 Age-related osteoporosis without current pathological fracture: Secondary | ICD-10-CM | POA: Diagnosis not present

## 2017-05-16 DIAGNOSIS — I10 Essential (primary) hypertension: Secondary | ICD-10-CM | POA: Diagnosis not present

## 2017-05-16 DIAGNOSIS — F419 Anxiety disorder, unspecified: Secondary | ICD-10-CM | POA: Diagnosis not present

## 2017-05-16 DIAGNOSIS — R412 Retrograde amnesia: Secondary | ICD-10-CM | POA: Diagnosis not present

## 2017-05-16 DIAGNOSIS — M810 Age-related osteoporosis without current pathological fracture: Secondary | ICD-10-CM | POA: Diagnosis not present

## 2017-05-17 DIAGNOSIS — F419 Anxiety disorder, unspecified: Secondary | ICD-10-CM | POA: Diagnosis not present

## 2017-05-17 DIAGNOSIS — M810 Age-related osteoporosis without current pathological fracture: Secondary | ICD-10-CM | POA: Diagnosis not present

## 2017-05-17 DIAGNOSIS — R412 Retrograde amnesia: Secondary | ICD-10-CM | POA: Diagnosis not present

## 2017-05-17 DIAGNOSIS — I10 Essential (primary) hypertension: Secondary | ICD-10-CM | POA: Diagnosis not present

## 2017-05-18 DIAGNOSIS — F419 Anxiety disorder, unspecified: Secondary | ICD-10-CM | POA: Diagnosis not present

## 2017-05-18 DIAGNOSIS — R412 Retrograde amnesia: Secondary | ICD-10-CM | POA: Diagnosis not present

## 2017-05-18 DIAGNOSIS — I10 Essential (primary) hypertension: Secondary | ICD-10-CM | POA: Diagnosis not present

## 2017-05-18 DIAGNOSIS — M810 Age-related osteoporosis without current pathological fracture: Secondary | ICD-10-CM | POA: Diagnosis not present

## 2017-05-19 DIAGNOSIS — F419 Anxiety disorder, unspecified: Secondary | ICD-10-CM | POA: Diagnosis not present

## 2017-05-19 DIAGNOSIS — R412 Retrograde amnesia: Secondary | ICD-10-CM | POA: Diagnosis not present

## 2017-05-19 DIAGNOSIS — I10 Essential (primary) hypertension: Secondary | ICD-10-CM | POA: Diagnosis not present

## 2017-05-19 DIAGNOSIS — M810 Age-related osteoporosis without current pathological fracture: Secondary | ICD-10-CM | POA: Diagnosis not present

## 2017-05-20 DIAGNOSIS — I10 Essential (primary) hypertension: Secondary | ICD-10-CM | POA: Diagnosis not present

## 2017-05-20 DIAGNOSIS — M810 Age-related osteoporosis without current pathological fracture: Secondary | ICD-10-CM | POA: Diagnosis not present

## 2017-05-20 DIAGNOSIS — R412 Retrograde amnesia: Secondary | ICD-10-CM | POA: Diagnosis not present

## 2017-05-20 DIAGNOSIS — F419 Anxiety disorder, unspecified: Secondary | ICD-10-CM | POA: Diagnosis not present

## 2017-05-21 DIAGNOSIS — R412 Retrograde amnesia: Secondary | ICD-10-CM | POA: Diagnosis not present

## 2017-05-21 DIAGNOSIS — F419 Anxiety disorder, unspecified: Secondary | ICD-10-CM | POA: Diagnosis not present

## 2017-05-21 DIAGNOSIS — I10 Essential (primary) hypertension: Secondary | ICD-10-CM | POA: Diagnosis not present

## 2017-05-21 DIAGNOSIS — M810 Age-related osteoporosis without current pathological fracture: Secondary | ICD-10-CM | POA: Diagnosis not present

## 2017-05-22 DIAGNOSIS — I10 Essential (primary) hypertension: Secondary | ICD-10-CM | POA: Diagnosis not present

## 2017-05-22 DIAGNOSIS — M810 Age-related osteoporosis without current pathological fracture: Secondary | ICD-10-CM | POA: Diagnosis not present

## 2017-05-22 DIAGNOSIS — F419 Anxiety disorder, unspecified: Secondary | ICD-10-CM | POA: Diagnosis not present

## 2017-05-22 DIAGNOSIS — R412 Retrograde amnesia: Secondary | ICD-10-CM | POA: Diagnosis not present

## 2017-05-23 DIAGNOSIS — F419 Anxiety disorder, unspecified: Secondary | ICD-10-CM | POA: Diagnosis not present

## 2017-05-23 DIAGNOSIS — I10 Essential (primary) hypertension: Secondary | ICD-10-CM | POA: Diagnosis not present

## 2017-05-23 DIAGNOSIS — R412 Retrograde amnesia: Secondary | ICD-10-CM | POA: Diagnosis not present

## 2017-05-23 DIAGNOSIS — M810 Age-related osteoporosis without current pathological fracture: Secondary | ICD-10-CM | POA: Diagnosis not present

## 2017-05-24 DIAGNOSIS — M810 Age-related osteoporosis without current pathological fracture: Secondary | ICD-10-CM | POA: Diagnosis not present

## 2017-05-24 DIAGNOSIS — F419 Anxiety disorder, unspecified: Secondary | ICD-10-CM | POA: Diagnosis not present

## 2017-05-24 DIAGNOSIS — R412 Retrograde amnesia: Secondary | ICD-10-CM | POA: Diagnosis not present

## 2017-05-24 DIAGNOSIS — I10 Essential (primary) hypertension: Secondary | ICD-10-CM | POA: Diagnosis not present

## 2017-05-25 DIAGNOSIS — F419 Anxiety disorder, unspecified: Secondary | ICD-10-CM | POA: Diagnosis not present

## 2017-05-25 DIAGNOSIS — R412 Retrograde amnesia: Secondary | ICD-10-CM | POA: Diagnosis not present

## 2017-05-25 DIAGNOSIS — I10 Essential (primary) hypertension: Secondary | ICD-10-CM | POA: Diagnosis not present

## 2017-05-25 DIAGNOSIS — M810 Age-related osteoporosis without current pathological fracture: Secondary | ICD-10-CM | POA: Diagnosis not present

## 2017-05-26 DIAGNOSIS — M810 Age-related osteoporosis without current pathological fracture: Secondary | ICD-10-CM | POA: Diagnosis not present

## 2017-05-26 DIAGNOSIS — I10 Essential (primary) hypertension: Secondary | ICD-10-CM | POA: Diagnosis not present

## 2017-05-26 DIAGNOSIS — R412 Retrograde amnesia: Secondary | ICD-10-CM | POA: Diagnosis not present

## 2017-05-26 DIAGNOSIS — F419 Anxiety disorder, unspecified: Secondary | ICD-10-CM | POA: Diagnosis not present

## 2017-05-27 DIAGNOSIS — M810 Age-related osteoporosis without current pathological fracture: Secondary | ICD-10-CM | POA: Diagnosis not present

## 2017-05-27 DIAGNOSIS — I10 Essential (primary) hypertension: Secondary | ICD-10-CM | POA: Diagnosis not present

## 2017-05-27 DIAGNOSIS — R412 Retrograde amnesia: Secondary | ICD-10-CM | POA: Diagnosis not present

## 2017-05-27 DIAGNOSIS — F419 Anxiety disorder, unspecified: Secondary | ICD-10-CM | POA: Diagnosis not present

## 2017-05-28 DIAGNOSIS — M810 Age-related osteoporosis without current pathological fracture: Secondary | ICD-10-CM | POA: Diagnosis not present

## 2017-05-28 DIAGNOSIS — R412 Retrograde amnesia: Secondary | ICD-10-CM | POA: Diagnosis not present

## 2017-05-28 DIAGNOSIS — F419 Anxiety disorder, unspecified: Secondary | ICD-10-CM | POA: Diagnosis not present

## 2017-05-28 DIAGNOSIS — I10 Essential (primary) hypertension: Secondary | ICD-10-CM | POA: Diagnosis not present

## 2017-05-29 DIAGNOSIS — M810 Age-related osteoporosis without current pathological fracture: Secondary | ICD-10-CM | POA: Diagnosis not present

## 2017-05-29 DIAGNOSIS — R412 Retrograde amnesia: Secondary | ICD-10-CM | POA: Diagnosis not present

## 2017-05-29 DIAGNOSIS — I10 Essential (primary) hypertension: Secondary | ICD-10-CM | POA: Diagnosis not present

## 2017-05-29 DIAGNOSIS — F419 Anxiety disorder, unspecified: Secondary | ICD-10-CM | POA: Diagnosis not present

## 2017-05-30 DIAGNOSIS — I10 Essential (primary) hypertension: Secondary | ICD-10-CM | POA: Diagnosis not present

## 2017-05-30 DIAGNOSIS — R412 Retrograde amnesia: Secondary | ICD-10-CM | POA: Diagnosis not present

## 2017-05-30 DIAGNOSIS — M810 Age-related osteoporosis without current pathological fracture: Secondary | ICD-10-CM | POA: Diagnosis not present

## 2017-05-30 DIAGNOSIS — F419 Anxiety disorder, unspecified: Secondary | ICD-10-CM | POA: Diagnosis not present

## 2017-05-31 DIAGNOSIS — M810 Age-related osteoporosis without current pathological fracture: Secondary | ICD-10-CM | POA: Diagnosis not present

## 2017-05-31 DIAGNOSIS — R412 Retrograde amnesia: Secondary | ICD-10-CM | POA: Diagnosis not present

## 2017-05-31 DIAGNOSIS — F419 Anxiety disorder, unspecified: Secondary | ICD-10-CM | POA: Diagnosis not present

## 2017-05-31 DIAGNOSIS — I10 Essential (primary) hypertension: Secondary | ICD-10-CM | POA: Diagnosis not present

## 2017-06-01 DIAGNOSIS — I10 Essential (primary) hypertension: Secondary | ICD-10-CM | POA: Diagnosis not present

## 2017-06-01 DIAGNOSIS — M810 Age-related osteoporosis without current pathological fracture: Secondary | ICD-10-CM | POA: Diagnosis not present

## 2017-06-01 DIAGNOSIS — R412 Retrograde amnesia: Secondary | ICD-10-CM | POA: Diagnosis not present

## 2017-06-01 DIAGNOSIS — F419 Anxiety disorder, unspecified: Secondary | ICD-10-CM | POA: Diagnosis not present

## 2017-06-02 DIAGNOSIS — M810 Age-related osteoporosis without current pathological fracture: Secondary | ICD-10-CM | POA: Diagnosis not present

## 2017-06-02 DIAGNOSIS — I10 Essential (primary) hypertension: Secondary | ICD-10-CM | POA: Diagnosis not present

## 2017-06-02 DIAGNOSIS — F419 Anxiety disorder, unspecified: Secondary | ICD-10-CM | POA: Diagnosis not present

## 2017-06-02 DIAGNOSIS — R412 Retrograde amnesia: Secondary | ICD-10-CM | POA: Diagnosis not present

## 2017-06-03 DIAGNOSIS — F419 Anxiety disorder, unspecified: Secondary | ICD-10-CM | POA: Diagnosis not present

## 2017-06-03 DIAGNOSIS — I10 Essential (primary) hypertension: Secondary | ICD-10-CM | POA: Diagnosis not present

## 2017-06-03 DIAGNOSIS — R412 Retrograde amnesia: Secondary | ICD-10-CM | POA: Diagnosis not present

## 2017-06-03 DIAGNOSIS — M810 Age-related osteoporosis without current pathological fracture: Secondary | ICD-10-CM | POA: Diagnosis not present

## 2017-06-04 DIAGNOSIS — M810 Age-related osteoporosis without current pathological fracture: Secondary | ICD-10-CM | POA: Diagnosis not present

## 2017-06-04 DIAGNOSIS — F419 Anxiety disorder, unspecified: Secondary | ICD-10-CM | POA: Diagnosis not present

## 2017-06-04 DIAGNOSIS — R412 Retrograde amnesia: Secondary | ICD-10-CM | POA: Diagnosis not present

## 2017-06-04 DIAGNOSIS — I10 Essential (primary) hypertension: Secondary | ICD-10-CM | POA: Diagnosis not present

## 2017-06-05 DIAGNOSIS — F419 Anxiety disorder, unspecified: Secondary | ICD-10-CM | POA: Diagnosis not present

## 2017-06-05 DIAGNOSIS — R412 Retrograde amnesia: Secondary | ICD-10-CM | POA: Diagnosis not present

## 2017-06-05 DIAGNOSIS — I10 Essential (primary) hypertension: Secondary | ICD-10-CM | POA: Diagnosis not present

## 2017-06-05 DIAGNOSIS — M810 Age-related osteoporosis without current pathological fracture: Secondary | ICD-10-CM | POA: Diagnosis not present

## 2017-06-06 DIAGNOSIS — I10 Essential (primary) hypertension: Secondary | ICD-10-CM | POA: Diagnosis not present

## 2017-06-06 DIAGNOSIS — M810 Age-related osteoporosis without current pathological fracture: Secondary | ICD-10-CM | POA: Diagnosis not present

## 2017-06-06 DIAGNOSIS — R412 Retrograde amnesia: Secondary | ICD-10-CM | POA: Diagnosis not present

## 2017-06-06 DIAGNOSIS — F419 Anxiety disorder, unspecified: Secondary | ICD-10-CM | POA: Diagnosis not present

## 2017-06-07 DIAGNOSIS — M810 Age-related osteoporosis without current pathological fracture: Secondary | ICD-10-CM | POA: Diagnosis not present

## 2017-06-07 DIAGNOSIS — F419 Anxiety disorder, unspecified: Secondary | ICD-10-CM | POA: Diagnosis not present

## 2017-06-07 DIAGNOSIS — R412 Retrograde amnesia: Secondary | ICD-10-CM | POA: Diagnosis not present

## 2017-06-07 DIAGNOSIS — I10 Essential (primary) hypertension: Secondary | ICD-10-CM | POA: Diagnosis not present

## 2017-06-08 DIAGNOSIS — F419 Anxiety disorder, unspecified: Secondary | ICD-10-CM | POA: Diagnosis not present

## 2017-06-08 DIAGNOSIS — M810 Age-related osteoporosis without current pathological fracture: Secondary | ICD-10-CM | POA: Diagnosis not present

## 2017-06-08 DIAGNOSIS — I10 Essential (primary) hypertension: Secondary | ICD-10-CM | POA: Diagnosis not present

## 2017-06-08 DIAGNOSIS — R412 Retrograde amnesia: Secondary | ICD-10-CM | POA: Diagnosis not present

## 2017-06-09 DIAGNOSIS — I10 Essential (primary) hypertension: Secondary | ICD-10-CM | POA: Diagnosis not present

## 2017-06-09 DIAGNOSIS — M810 Age-related osteoporosis without current pathological fracture: Secondary | ICD-10-CM | POA: Diagnosis not present

## 2017-06-09 DIAGNOSIS — R412 Retrograde amnesia: Secondary | ICD-10-CM | POA: Diagnosis not present

## 2017-06-09 DIAGNOSIS — F419 Anxiety disorder, unspecified: Secondary | ICD-10-CM | POA: Diagnosis not present

## 2017-06-10 DIAGNOSIS — I10 Essential (primary) hypertension: Secondary | ICD-10-CM | POA: Diagnosis not present

## 2017-06-10 DIAGNOSIS — F419 Anxiety disorder, unspecified: Secondary | ICD-10-CM | POA: Diagnosis not present

## 2017-06-10 DIAGNOSIS — M810 Age-related osteoporosis without current pathological fracture: Secondary | ICD-10-CM | POA: Diagnosis not present

## 2017-06-10 DIAGNOSIS — R412 Retrograde amnesia: Secondary | ICD-10-CM | POA: Diagnosis not present

## 2017-06-11 DIAGNOSIS — I10 Essential (primary) hypertension: Secondary | ICD-10-CM | POA: Diagnosis not present

## 2017-06-11 DIAGNOSIS — F419 Anxiety disorder, unspecified: Secondary | ICD-10-CM | POA: Diagnosis not present

## 2017-06-11 DIAGNOSIS — M810 Age-related osteoporosis without current pathological fracture: Secondary | ICD-10-CM | POA: Diagnosis not present

## 2017-06-11 DIAGNOSIS — R412 Retrograde amnesia: Secondary | ICD-10-CM | POA: Diagnosis not present

## 2017-06-12 DIAGNOSIS — I10 Essential (primary) hypertension: Secondary | ICD-10-CM | POA: Diagnosis not present

## 2017-06-12 DIAGNOSIS — F419 Anxiety disorder, unspecified: Secondary | ICD-10-CM | POA: Diagnosis not present

## 2017-06-12 DIAGNOSIS — R412 Retrograde amnesia: Secondary | ICD-10-CM | POA: Diagnosis not present

## 2017-06-12 DIAGNOSIS — M810 Age-related osteoporosis without current pathological fracture: Secondary | ICD-10-CM | POA: Diagnosis not present

## 2017-06-13 DIAGNOSIS — M810 Age-related osteoporosis without current pathological fracture: Secondary | ICD-10-CM | POA: Diagnosis not present

## 2017-06-13 DIAGNOSIS — F419 Anxiety disorder, unspecified: Secondary | ICD-10-CM | POA: Diagnosis not present

## 2017-06-13 DIAGNOSIS — R412 Retrograde amnesia: Secondary | ICD-10-CM | POA: Diagnosis not present

## 2017-06-13 DIAGNOSIS — I10 Essential (primary) hypertension: Secondary | ICD-10-CM | POA: Diagnosis not present

## 2017-06-14 DIAGNOSIS — I10 Essential (primary) hypertension: Secondary | ICD-10-CM | POA: Diagnosis not present

## 2017-06-14 DIAGNOSIS — F419 Anxiety disorder, unspecified: Secondary | ICD-10-CM | POA: Diagnosis not present

## 2017-06-14 DIAGNOSIS — R412 Retrograde amnesia: Secondary | ICD-10-CM | POA: Diagnosis not present

## 2017-06-14 DIAGNOSIS — M810 Age-related osteoporosis without current pathological fracture: Secondary | ICD-10-CM | POA: Diagnosis not present

## 2017-06-15 DIAGNOSIS — M810 Age-related osteoporosis without current pathological fracture: Secondary | ICD-10-CM | POA: Diagnosis not present

## 2017-06-15 DIAGNOSIS — I10 Essential (primary) hypertension: Secondary | ICD-10-CM | POA: Diagnosis not present

## 2017-06-15 DIAGNOSIS — F419 Anxiety disorder, unspecified: Secondary | ICD-10-CM | POA: Diagnosis not present

## 2017-06-15 DIAGNOSIS — R412 Retrograde amnesia: Secondary | ICD-10-CM | POA: Diagnosis not present

## 2017-06-16 DIAGNOSIS — R412 Retrograde amnesia: Secondary | ICD-10-CM | POA: Diagnosis not present

## 2017-06-16 DIAGNOSIS — I10 Essential (primary) hypertension: Secondary | ICD-10-CM | POA: Diagnosis not present

## 2017-06-16 DIAGNOSIS — M810 Age-related osteoporosis without current pathological fracture: Secondary | ICD-10-CM | POA: Diagnosis not present

## 2017-06-16 DIAGNOSIS — F419 Anxiety disorder, unspecified: Secondary | ICD-10-CM | POA: Diagnosis not present

## 2017-06-17 DIAGNOSIS — M810 Age-related osteoporosis without current pathological fracture: Secondary | ICD-10-CM | POA: Diagnosis not present

## 2017-06-17 DIAGNOSIS — F419 Anxiety disorder, unspecified: Secondary | ICD-10-CM | POA: Diagnosis not present

## 2017-06-17 DIAGNOSIS — R412 Retrograde amnesia: Secondary | ICD-10-CM | POA: Diagnosis not present

## 2017-06-17 DIAGNOSIS — I10 Essential (primary) hypertension: Secondary | ICD-10-CM | POA: Diagnosis not present

## 2017-06-18 DIAGNOSIS — F419 Anxiety disorder, unspecified: Secondary | ICD-10-CM | POA: Diagnosis not present

## 2017-06-18 DIAGNOSIS — I10 Essential (primary) hypertension: Secondary | ICD-10-CM | POA: Diagnosis not present

## 2017-06-18 DIAGNOSIS — M810 Age-related osteoporosis without current pathological fracture: Secondary | ICD-10-CM | POA: Diagnosis not present

## 2017-06-18 DIAGNOSIS — R412 Retrograde amnesia: Secondary | ICD-10-CM | POA: Diagnosis not present

## 2017-06-19 DIAGNOSIS — F419 Anxiety disorder, unspecified: Secondary | ICD-10-CM | POA: Diagnosis not present

## 2017-06-19 DIAGNOSIS — R412 Retrograde amnesia: Secondary | ICD-10-CM | POA: Diagnosis not present

## 2017-06-19 DIAGNOSIS — M810 Age-related osteoporosis without current pathological fracture: Secondary | ICD-10-CM | POA: Diagnosis not present

## 2017-06-19 DIAGNOSIS — I10 Essential (primary) hypertension: Secondary | ICD-10-CM | POA: Diagnosis not present

## 2017-06-20 DIAGNOSIS — I10 Essential (primary) hypertension: Secondary | ICD-10-CM | POA: Diagnosis not present

## 2017-06-20 DIAGNOSIS — K219 Gastro-esophageal reflux disease without esophagitis: Secondary | ICD-10-CM | POA: Diagnosis not present

## 2017-06-20 DIAGNOSIS — R412 Retrograde amnesia: Secondary | ICD-10-CM | POA: Diagnosis not present

## 2017-06-20 DIAGNOSIS — Z6825 Body mass index (BMI) 25.0-25.9, adult: Secondary | ICD-10-CM | POA: Diagnosis not present

## 2017-06-20 DIAGNOSIS — F419 Anxiety disorder, unspecified: Secondary | ICD-10-CM | POA: Diagnosis not present

## 2017-06-20 DIAGNOSIS — M810 Age-related osteoporosis without current pathological fracture: Secondary | ICD-10-CM | POA: Diagnosis not present

## 2017-06-20 DIAGNOSIS — L2089 Other atopic dermatitis: Secondary | ICD-10-CM | POA: Diagnosis not present

## 2017-06-21 DIAGNOSIS — R412 Retrograde amnesia: Secondary | ICD-10-CM | POA: Diagnosis not present

## 2017-06-21 DIAGNOSIS — F419 Anxiety disorder, unspecified: Secondary | ICD-10-CM | POA: Diagnosis not present

## 2017-06-21 DIAGNOSIS — I10 Essential (primary) hypertension: Secondary | ICD-10-CM | POA: Diagnosis not present

## 2017-06-21 DIAGNOSIS — M810 Age-related osteoporosis without current pathological fracture: Secondary | ICD-10-CM | POA: Diagnosis not present

## 2017-06-22 DIAGNOSIS — I10 Essential (primary) hypertension: Secondary | ICD-10-CM | POA: Diagnosis not present

## 2017-06-22 DIAGNOSIS — M810 Age-related osteoporosis without current pathological fracture: Secondary | ICD-10-CM | POA: Diagnosis not present

## 2017-06-22 DIAGNOSIS — F419 Anxiety disorder, unspecified: Secondary | ICD-10-CM | POA: Diagnosis not present

## 2017-06-22 DIAGNOSIS — R412 Retrograde amnesia: Secondary | ICD-10-CM | POA: Diagnosis not present

## 2017-06-23 DIAGNOSIS — I10 Essential (primary) hypertension: Secondary | ICD-10-CM | POA: Diagnosis not present

## 2017-06-23 DIAGNOSIS — R412 Retrograde amnesia: Secondary | ICD-10-CM | POA: Diagnosis not present

## 2017-06-23 DIAGNOSIS — M810 Age-related osteoporosis without current pathological fracture: Secondary | ICD-10-CM | POA: Diagnosis not present

## 2017-06-23 DIAGNOSIS — F419 Anxiety disorder, unspecified: Secondary | ICD-10-CM | POA: Diagnosis not present

## 2017-06-24 DIAGNOSIS — M810 Age-related osteoporosis without current pathological fracture: Secondary | ICD-10-CM | POA: Diagnosis not present

## 2017-06-24 DIAGNOSIS — F419 Anxiety disorder, unspecified: Secondary | ICD-10-CM | POA: Diagnosis not present

## 2017-06-24 DIAGNOSIS — R412 Retrograde amnesia: Secondary | ICD-10-CM | POA: Diagnosis not present

## 2017-06-24 DIAGNOSIS — I10 Essential (primary) hypertension: Secondary | ICD-10-CM | POA: Diagnosis not present

## 2017-06-25 DIAGNOSIS — F419 Anxiety disorder, unspecified: Secondary | ICD-10-CM | POA: Diagnosis not present

## 2017-06-25 DIAGNOSIS — I10 Essential (primary) hypertension: Secondary | ICD-10-CM | POA: Diagnosis not present

## 2017-06-25 DIAGNOSIS — R412 Retrograde amnesia: Secondary | ICD-10-CM | POA: Diagnosis not present

## 2017-06-25 DIAGNOSIS — M810 Age-related osteoporosis without current pathological fracture: Secondary | ICD-10-CM | POA: Diagnosis not present

## 2017-06-26 DIAGNOSIS — M810 Age-related osteoporosis without current pathological fracture: Secondary | ICD-10-CM | POA: Diagnosis not present

## 2017-06-26 DIAGNOSIS — F419 Anxiety disorder, unspecified: Secondary | ICD-10-CM | POA: Diagnosis not present

## 2017-06-26 DIAGNOSIS — I10 Essential (primary) hypertension: Secondary | ICD-10-CM | POA: Diagnosis not present

## 2017-06-26 DIAGNOSIS — R412 Retrograde amnesia: Secondary | ICD-10-CM | POA: Diagnosis not present

## 2017-06-27 DIAGNOSIS — I10 Essential (primary) hypertension: Secondary | ICD-10-CM | POA: Diagnosis not present

## 2017-06-27 DIAGNOSIS — R412 Retrograde amnesia: Secondary | ICD-10-CM | POA: Diagnosis not present

## 2017-06-27 DIAGNOSIS — F419 Anxiety disorder, unspecified: Secondary | ICD-10-CM | POA: Diagnosis not present

## 2017-06-27 DIAGNOSIS — M810 Age-related osteoporosis without current pathological fracture: Secondary | ICD-10-CM | POA: Diagnosis not present

## 2017-06-28 DIAGNOSIS — M810 Age-related osteoporosis without current pathological fracture: Secondary | ICD-10-CM | POA: Diagnosis not present

## 2017-06-28 DIAGNOSIS — R412 Retrograde amnesia: Secondary | ICD-10-CM | POA: Diagnosis not present

## 2017-06-28 DIAGNOSIS — I10 Essential (primary) hypertension: Secondary | ICD-10-CM | POA: Diagnosis not present

## 2017-06-28 DIAGNOSIS — F419 Anxiety disorder, unspecified: Secondary | ICD-10-CM | POA: Diagnosis not present

## 2017-06-29 DIAGNOSIS — R412 Retrograde amnesia: Secondary | ICD-10-CM | POA: Diagnosis not present

## 2017-06-29 DIAGNOSIS — M810 Age-related osteoporosis without current pathological fracture: Secondary | ICD-10-CM | POA: Diagnosis not present

## 2017-06-29 DIAGNOSIS — I10 Essential (primary) hypertension: Secondary | ICD-10-CM | POA: Diagnosis not present

## 2017-06-29 DIAGNOSIS — F419 Anxiety disorder, unspecified: Secondary | ICD-10-CM | POA: Diagnosis not present

## 2017-06-30 DIAGNOSIS — I10 Essential (primary) hypertension: Secondary | ICD-10-CM | POA: Diagnosis not present

## 2017-06-30 DIAGNOSIS — F419 Anxiety disorder, unspecified: Secondary | ICD-10-CM | POA: Diagnosis not present

## 2017-06-30 DIAGNOSIS — M810 Age-related osteoporosis without current pathological fracture: Secondary | ICD-10-CM | POA: Diagnosis not present

## 2017-06-30 DIAGNOSIS — R412 Retrograde amnesia: Secondary | ICD-10-CM | POA: Diagnosis not present

## 2017-07-01 DIAGNOSIS — I10 Essential (primary) hypertension: Secondary | ICD-10-CM | POA: Diagnosis not present

## 2017-07-01 DIAGNOSIS — M810 Age-related osteoporosis without current pathological fracture: Secondary | ICD-10-CM | POA: Diagnosis not present

## 2017-07-01 DIAGNOSIS — F419 Anxiety disorder, unspecified: Secondary | ICD-10-CM | POA: Diagnosis not present

## 2017-07-01 DIAGNOSIS — R412 Retrograde amnesia: Secondary | ICD-10-CM | POA: Diagnosis not present

## 2017-07-02 DIAGNOSIS — I10 Essential (primary) hypertension: Secondary | ICD-10-CM | POA: Diagnosis not present

## 2017-07-02 DIAGNOSIS — F419 Anxiety disorder, unspecified: Secondary | ICD-10-CM | POA: Diagnosis not present

## 2017-07-02 DIAGNOSIS — R412 Retrograde amnesia: Secondary | ICD-10-CM | POA: Diagnosis not present

## 2017-07-02 DIAGNOSIS — M810 Age-related osteoporosis without current pathological fracture: Secondary | ICD-10-CM | POA: Diagnosis not present

## 2017-07-03 DIAGNOSIS — R412 Retrograde amnesia: Secondary | ICD-10-CM | POA: Diagnosis not present

## 2017-07-03 DIAGNOSIS — M810 Age-related osteoporosis without current pathological fracture: Secondary | ICD-10-CM | POA: Diagnosis not present

## 2017-07-03 DIAGNOSIS — I10 Essential (primary) hypertension: Secondary | ICD-10-CM | POA: Diagnosis not present

## 2017-07-03 DIAGNOSIS — F419 Anxiety disorder, unspecified: Secondary | ICD-10-CM | POA: Diagnosis not present

## 2017-07-04 DIAGNOSIS — M810 Age-related osteoporosis without current pathological fracture: Secondary | ICD-10-CM | POA: Diagnosis not present

## 2017-07-04 DIAGNOSIS — I10 Essential (primary) hypertension: Secondary | ICD-10-CM | POA: Diagnosis not present

## 2017-07-04 DIAGNOSIS — R412 Retrograde amnesia: Secondary | ICD-10-CM | POA: Diagnosis not present

## 2017-07-04 DIAGNOSIS — F419 Anxiety disorder, unspecified: Secondary | ICD-10-CM | POA: Diagnosis not present

## 2017-07-05 DIAGNOSIS — M810 Age-related osteoporosis without current pathological fracture: Secondary | ICD-10-CM | POA: Diagnosis not present

## 2017-07-05 DIAGNOSIS — F419 Anxiety disorder, unspecified: Secondary | ICD-10-CM | POA: Diagnosis not present

## 2017-07-05 DIAGNOSIS — R412 Retrograde amnesia: Secondary | ICD-10-CM | POA: Diagnosis not present

## 2017-07-05 DIAGNOSIS — I10 Essential (primary) hypertension: Secondary | ICD-10-CM | POA: Diagnosis not present

## 2017-07-06 DIAGNOSIS — R412 Retrograde amnesia: Secondary | ICD-10-CM | POA: Diagnosis not present

## 2017-07-06 DIAGNOSIS — F419 Anxiety disorder, unspecified: Secondary | ICD-10-CM | POA: Diagnosis not present

## 2017-07-06 DIAGNOSIS — M810 Age-related osteoporosis without current pathological fracture: Secondary | ICD-10-CM | POA: Diagnosis not present

## 2017-07-06 DIAGNOSIS — I10 Essential (primary) hypertension: Secondary | ICD-10-CM | POA: Diagnosis not present

## 2017-07-07 DIAGNOSIS — M810 Age-related osteoporosis without current pathological fracture: Secondary | ICD-10-CM | POA: Diagnosis not present

## 2017-07-07 DIAGNOSIS — R412 Retrograde amnesia: Secondary | ICD-10-CM | POA: Diagnosis not present

## 2017-07-07 DIAGNOSIS — I10 Essential (primary) hypertension: Secondary | ICD-10-CM | POA: Diagnosis not present

## 2017-07-07 DIAGNOSIS — F419 Anxiety disorder, unspecified: Secondary | ICD-10-CM | POA: Diagnosis not present

## 2017-07-08 DIAGNOSIS — R412 Retrograde amnesia: Secondary | ICD-10-CM | POA: Diagnosis not present

## 2017-07-08 DIAGNOSIS — I10 Essential (primary) hypertension: Secondary | ICD-10-CM | POA: Diagnosis not present

## 2017-07-08 DIAGNOSIS — M810 Age-related osteoporosis without current pathological fracture: Secondary | ICD-10-CM | POA: Diagnosis not present

## 2017-07-08 DIAGNOSIS — F419 Anxiety disorder, unspecified: Secondary | ICD-10-CM | POA: Diagnosis not present

## 2017-07-09 DIAGNOSIS — F419 Anxiety disorder, unspecified: Secondary | ICD-10-CM | POA: Diagnosis not present

## 2017-07-09 DIAGNOSIS — M810 Age-related osteoporosis without current pathological fracture: Secondary | ICD-10-CM | POA: Diagnosis not present

## 2017-07-09 DIAGNOSIS — I10 Essential (primary) hypertension: Secondary | ICD-10-CM | POA: Diagnosis not present

## 2017-07-09 DIAGNOSIS — R412 Retrograde amnesia: Secondary | ICD-10-CM | POA: Diagnosis not present

## 2017-07-10 DIAGNOSIS — R412 Retrograde amnesia: Secondary | ICD-10-CM | POA: Diagnosis not present

## 2017-07-10 DIAGNOSIS — F419 Anxiety disorder, unspecified: Secondary | ICD-10-CM | POA: Diagnosis not present

## 2017-07-10 DIAGNOSIS — I10 Essential (primary) hypertension: Secondary | ICD-10-CM | POA: Diagnosis not present

## 2017-07-10 DIAGNOSIS — M810 Age-related osteoporosis without current pathological fracture: Secondary | ICD-10-CM | POA: Diagnosis not present

## 2017-07-11 DIAGNOSIS — M810 Age-related osteoporosis without current pathological fracture: Secondary | ICD-10-CM | POA: Diagnosis not present

## 2017-07-11 DIAGNOSIS — R412 Retrograde amnesia: Secondary | ICD-10-CM | POA: Diagnosis not present

## 2017-07-11 DIAGNOSIS — I10 Essential (primary) hypertension: Secondary | ICD-10-CM | POA: Diagnosis not present

## 2017-07-11 DIAGNOSIS — F419 Anxiety disorder, unspecified: Secondary | ICD-10-CM | POA: Diagnosis not present

## 2017-07-12 DIAGNOSIS — R412 Retrograde amnesia: Secondary | ICD-10-CM | POA: Diagnosis not present

## 2017-07-12 DIAGNOSIS — I10 Essential (primary) hypertension: Secondary | ICD-10-CM | POA: Diagnosis not present

## 2017-07-12 DIAGNOSIS — M810 Age-related osteoporosis without current pathological fracture: Secondary | ICD-10-CM | POA: Diagnosis not present

## 2017-07-12 DIAGNOSIS — F419 Anxiety disorder, unspecified: Secondary | ICD-10-CM | POA: Diagnosis not present

## 2017-07-13 DIAGNOSIS — M810 Age-related osteoporosis without current pathological fracture: Secondary | ICD-10-CM | POA: Diagnosis not present

## 2017-07-13 DIAGNOSIS — I10 Essential (primary) hypertension: Secondary | ICD-10-CM | POA: Diagnosis not present

## 2017-07-13 DIAGNOSIS — F419 Anxiety disorder, unspecified: Secondary | ICD-10-CM | POA: Diagnosis not present

## 2017-07-13 DIAGNOSIS — R412 Retrograde amnesia: Secondary | ICD-10-CM | POA: Diagnosis not present

## 2017-07-14 DIAGNOSIS — M810 Age-related osteoporosis without current pathological fracture: Secondary | ICD-10-CM | POA: Diagnosis not present

## 2017-07-14 DIAGNOSIS — I10 Essential (primary) hypertension: Secondary | ICD-10-CM | POA: Diagnosis not present

## 2017-07-14 DIAGNOSIS — R412 Retrograde amnesia: Secondary | ICD-10-CM | POA: Diagnosis not present

## 2017-07-14 DIAGNOSIS — F419 Anxiety disorder, unspecified: Secondary | ICD-10-CM | POA: Diagnosis not present

## 2017-07-15 DIAGNOSIS — I10 Essential (primary) hypertension: Secondary | ICD-10-CM | POA: Diagnosis not present

## 2017-07-15 DIAGNOSIS — F419 Anxiety disorder, unspecified: Secondary | ICD-10-CM | POA: Diagnosis not present

## 2017-07-15 DIAGNOSIS — R412 Retrograde amnesia: Secondary | ICD-10-CM | POA: Diagnosis not present

## 2017-07-15 DIAGNOSIS — M810 Age-related osteoporosis without current pathological fracture: Secondary | ICD-10-CM | POA: Diagnosis not present

## 2017-07-16 DIAGNOSIS — F419 Anxiety disorder, unspecified: Secondary | ICD-10-CM | POA: Diagnosis not present

## 2017-07-16 DIAGNOSIS — R412 Retrograde amnesia: Secondary | ICD-10-CM | POA: Diagnosis not present

## 2017-07-16 DIAGNOSIS — I10 Essential (primary) hypertension: Secondary | ICD-10-CM | POA: Diagnosis not present

## 2017-07-16 DIAGNOSIS — M810 Age-related osteoporosis without current pathological fracture: Secondary | ICD-10-CM | POA: Diagnosis not present

## 2017-07-17 DIAGNOSIS — I10 Essential (primary) hypertension: Secondary | ICD-10-CM | POA: Diagnosis not present

## 2017-07-17 DIAGNOSIS — M810 Age-related osteoporosis without current pathological fracture: Secondary | ICD-10-CM | POA: Diagnosis not present

## 2017-07-17 DIAGNOSIS — R412 Retrograde amnesia: Secondary | ICD-10-CM | POA: Diagnosis not present

## 2017-07-17 DIAGNOSIS — F419 Anxiety disorder, unspecified: Secondary | ICD-10-CM | POA: Diagnosis not present

## 2017-07-18 DIAGNOSIS — I10 Essential (primary) hypertension: Secondary | ICD-10-CM | POA: Diagnosis not present

## 2017-07-18 DIAGNOSIS — F419 Anxiety disorder, unspecified: Secondary | ICD-10-CM | POA: Diagnosis not present

## 2017-07-18 DIAGNOSIS — R412 Retrograde amnesia: Secondary | ICD-10-CM | POA: Diagnosis not present

## 2017-07-18 DIAGNOSIS — M810 Age-related osteoporosis without current pathological fracture: Secondary | ICD-10-CM | POA: Diagnosis not present

## 2017-07-19 DIAGNOSIS — R412 Retrograde amnesia: Secondary | ICD-10-CM | POA: Diagnosis not present

## 2017-07-19 DIAGNOSIS — F419 Anxiety disorder, unspecified: Secondary | ICD-10-CM | POA: Diagnosis not present

## 2017-07-19 DIAGNOSIS — M810 Age-related osteoporosis without current pathological fracture: Secondary | ICD-10-CM | POA: Diagnosis not present

## 2017-07-19 DIAGNOSIS — I10 Essential (primary) hypertension: Secondary | ICD-10-CM | POA: Diagnosis not present

## 2017-07-20 DIAGNOSIS — I10 Essential (primary) hypertension: Secondary | ICD-10-CM | POA: Diagnosis not present

## 2017-07-20 DIAGNOSIS — M810 Age-related osteoporosis without current pathological fracture: Secondary | ICD-10-CM | POA: Diagnosis not present

## 2017-07-20 DIAGNOSIS — F419 Anxiety disorder, unspecified: Secondary | ICD-10-CM | POA: Diagnosis not present

## 2017-07-20 DIAGNOSIS — R412 Retrograde amnesia: Secondary | ICD-10-CM | POA: Diagnosis not present

## 2017-07-21 DIAGNOSIS — I10 Essential (primary) hypertension: Secondary | ICD-10-CM | POA: Diagnosis not present

## 2017-07-21 DIAGNOSIS — R412 Retrograde amnesia: Secondary | ICD-10-CM | POA: Diagnosis not present

## 2017-07-21 DIAGNOSIS — M810 Age-related osteoporosis without current pathological fracture: Secondary | ICD-10-CM | POA: Diagnosis not present

## 2017-07-21 DIAGNOSIS — F419 Anxiety disorder, unspecified: Secondary | ICD-10-CM | POA: Diagnosis not present

## 2017-07-22 DIAGNOSIS — I10 Essential (primary) hypertension: Secondary | ICD-10-CM | POA: Diagnosis not present

## 2017-07-22 DIAGNOSIS — M810 Age-related osteoporosis without current pathological fracture: Secondary | ICD-10-CM | POA: Diagnosis not present

## 2017-07-22 DIAGNOSIS — R412 Retrograde amnesia: Secondary | ICD-10-CM | POA: Diagnosis not present

## 2017-07-22 DIAGNOSIS — F419 Anxiety disorder, unspecified: Secondary | ICD-10-CM | POA: Diagnosis not present

## 2017-07-23 DIAGNOSIS — R412 Retrograde amnesia: Secondary | ICD-10-CM | POA: Diagnosis not present

## 2017-07-23 DIAGNOSIS — F419 Anxiety disorder, unspecified: Secondary | ICD-10-CM | POA: Diagnosis not present

## 2017-07-23 DIAGNOSIS — I10 Essential (primary) hypertension: Secondary | ICD-10-CM | POA: Diagnosis not present

## 2017-07-23 DIAGNOSIS — M810 Age-related osteoporosis without current pathological fracture: Secondary | ICD-10-CM | POA: Diagnosis not present

## 2017-07-24 DIAGNOSIS — M810 Age-related osteoporosis without current pathological fracture: Secondary | ICD-10-CM | POA: Diagnosis not present

## 2017-07-24 DIAGNOSIS — R412 Retrograde amnesia: Secondary | ICD-10-CM | POA: Diagnosis not present

## 2017-07-24 DIAGNOSIS — I10 Essential (primary) hypertension: Secondary | ICD-10-CM | POA: Diagnosis not present

## 2017-07-24 DIAGNOSIS — F419 Anxiety disorder, unspecified: Secondary | ICD-10-CM | POA: Diagnosis not present

## 2017-07-25 DIAGNOSIS — I10 Essential (primary) hypertension: Secondary | ICD-10-CM | POA: Diagnosis not present

## 2017-07-25 DIAGNOSIS — R412 Retrograde amnesia: Secondary | ICD-10-CM | POA: Diagnosis not present

## 2017-07-25 DIAGNOSIS — M810 Age-related osteoporosis without current pathological fracture: Secondary | ICD-10-CM | POA: Diagnosis not present

## 2017-07-25 DIAGNOSIS — F419 Anxiety disorder, unspecified: Secondary | ICD-10-CM | POA: Diagnosis not present

## 2017-07-26 DIAGNOSIS — R412 Retrograde amnesia: Secondary | ICD-10-CM | POA: Diagnosis not present

## 2017-07-26 DIAGNOSIS — M810 Age-related osteoporosis without current pathological fracture: Secondary | ICD-10-CM | POA: Diagnosis not present

## 2017-07-26 DIAGNOSIS — I10 Essential (primary) hypertension: Secondary | ICD-10-CM | POA: Diagnosis not present

## 2017-07-26 DIAGNOSIS — F419 Anxiety disorder, unspecified: Secondary | ICD-10-CM | POA: Diagnosis not present

## 2017-07-27 DIAGNOSIS — R412 Retrograde amnesia: Secondary | ICD-10-CM | POA: Diagnosis not present

## 2017-07-27 DIAGNOSIS — M810 Age-related osteoporosis without current pathological fracture: Secondary | ICD-10-CM | POA: Diagnosis not present

## 2017-07-27 DIAGNOSIS — F419 Anxiety disorder, unspecified: Secondary | ICD-10-CM | POA: Diagnosis not present

## 2017-07-27 DIAGNOSIS — I10 Essential (primary) hypertension: Secondary | ICD-10-CM | POA: Diagnosis not present

## 2017-07-28 DIAGNOSIS — M810 Age-related osteoporosis without current pathological fracture: Secondary | ICD-10-CM | POA: Diagnosis not present

## 2017-07-28 DIAGNOSIS — F419 Anxiety disorder, unspecified: Secondary | ICD-10-CM | POA: Diagnosis not present

## 2017-07-28 DIAGNOSIS — R412 Retrograde amnesia: Secondary | ICD-10-CM | POA: Diagnosis not present

## 2017-07-28 DIAGNOSIS — I10 Essential (primary) hypertension: Secondary | ICD-10-CM | POA: Diagnosis not present

## 2017-07-29 DIAGNOSIS — I10 Essential (primary) hypertension: Secondary | ICD-10-CM | POA: Diagnosis not present

## 2017-07-29 DIAGNOSIS — F419 Anxiety disorder, unspecified: Secondary | ICD-10-CM | POA: Diagnosis not present

## 2017-07-29 DIAGNOSIS — R412 Retrograde amnesia: Secondary | ICD-10-CM | POA: Diagnosis not present

## 2017-07-29 DIAGNOSIS — M810 Age-related osteoporosis without current pathological fracture: Secondary | ICD-10-CM | POA: Diagnosis not present

## 2017-07-30 DIAGNOSIS — M810 Age-related osteoporosis without current pathological fracture: Secondary | ICD-10-CM | POA: Diagnosis not present

## 2017-07-30 DIAGNOSIS — I10 Essential (primary) hypertension: Secondary | ICD-10-CM | POA: Diagnosis not present

## 2017-07-30 DIAGNOSIS — R412 Retrograde amnesia: Secondary | ICD-10-CM | POA: Diagnosis not present

## 2017-07-30 DIAGNOSIS — F419 Anxiety disorder, unspecified: Secondary | ICD-10-CM | POA: Diagnosis not present

## 2017-07-31 DIAGNOSIS — R412 Retrograde amnesia: Secondary | ICD-10-CM | POA: Diagnosis not present

## 2017-07-31 DIAGNOSIS — I10 Essential (primary) hypertension: Secondary | ICD-10-CM | POA: Diagnosis not present

## 2017-07-31 DIAGNOSIS — M810 Age-related osteoporosis without current pathological fracture: Secondary | ICD-10-CM | POA: Diagnosis not present

## 2017-07-31 DIAGNOSIS — F419 Anxiety disorder, unspecified: Secondary | ICD-10-CM | POA: Diagnosis not present

## 2017-08-01 DIAGNOSIS — I10 Essential (primary) hypertension: Secondary | ICD-10-CM | POA: Diagnosis not present

## 2017-08-01 DIAGNOSIS — F419 Anxiety disorder, unspecified: Secondary | ICD-10-CM | POA: Diagnosis not present

## 2017-08-01 DIAGNOSIS — M810 Age-related osteoporosis without current pathological fracture: Secondary | ICD-10-CM | POA: Diagnosis not present

## 2017-08-01 DIAGNOSIS — R412 Retrograde amnesia: Secondary | ICD-10-CM | POA: Diagnosis not present

## 2017-08-02 DIAGNOSIS — F419 Anxiety disorder, unspecified: Secondary | ICD-10-CM | POA: Diagnosis not present

## 2017-08-02 DIAGNOSIS — R412 Retrograde amnesia: Secondary | ICD-10-CM | POA: Diagnosis not present

## 2017-08-02 DIAGNOSIS — I10 Essential (primary) hypertension: Secondary | ICD-10-CM | POA: Diagnosis not present

## 2017-08-02 DIAGNOSIS — M810 Age-related osteoporosis without current pathological fracture: Secondary | ICD-10-CM | POA: Diagnosis not present

## 2017-08-03 DIAGNOSIS — F419 Anxiety disorder, unspecified: Secondary | ICD-10-CM | POA: Diagnosis not present

## 2017-08-03 DIAGNOSIS — M810 Age-related osteoporosis without current pathological fracture: Secondary | ICD-10-CM | POA: Diagnosis not present

## 2017-08-03 DIAGNOSIS — I10 Essential (primary) hypertension: Secondary | ICD-10-CM | POA: Diagnosis not present

## 2017-08-03 DIAGNOSIS — R412 Retrograde amnesia: Secondary | ICD-10-CM | POA: Diagnosis not present

## 2017-08-04 DIAGNOSIS — L503 Dermatographic urticaria: Secondary | ICD-10-CM | POA: Diagnosis not present

## 2017-08-04 DIAGNOSIS — F419 Anxiety disorder, unspecified: Secondary | ICD-10-CM | POA: Diagnosis not present

## 2017-08-04 DIAGNOSIS — I10 Essential (primary) hypertension: Secondary | ICD-10-CM | POA: Diagnosis not present

## 2017-08-04 DIAGNOSIS — L308 Other specified dermatitis: Secondary | ICD-10-CM | POA: Diagnosis not present

## 2017-08-04 DIAGNOSIS — M810 Age-related osteoporosis without current pathological fracture: Secondary | ICD-10-CM | POA: Diagnosis not present

## 2017-08-04 DIAGNOSIS — R412 Retrograde amnesia: Secondary | ICD-10-CM | POA: Diagnosis not present

## 2017-08-05 DIAGNOSIS — M810 Age-related osteoporosis without current pathological fracture: Secondary | ICD-10-CM | POA: Diagnosis not present

## 2017-08-05 DIAGNOSIS — F419 Anxiety disorder, unspecified: Secondary | ICD-10-CM | POA: Diagnosis not present

## 2017-08-05 DIAGNOSIS — R412 Retrograde amnesia: Secondary | ICD-10-CM | POA: Diagnosis not present

## 2017-08-05 DIAGNOSIS — I10 Essential (primary) hypertension: Secondary | ICD-10-CM | POA: Diagnosis not present

## 2017-08-06 DIAGNOSIS — F419 Anxiety disorder, unspecified: Secondary | ICD-10-CM | POA: Diagnosis not present

## 2017-08-06 DIAGNOSIS — M810 Age-related osteoporosis without current pathological fracture: Secondary | ICD-10-CM | POA: Diagnosis not present

## 2017-08-06 DIAGNOSIS — I10 Essential (primary) hypertension: Secondary | ICD-10-CM | POA: Diagnosis not present

## 2017-08-06 DIAGNOSIS — R412 Retrograde amnesia: Secondary | ICD-10-CM | POA: Diagnosis not present

## 2017-08-07 DIAGNOSIS — F419 Anxiety disorder, unspecified: Secondary | ICD-10-CM | POA: Diagnosis not present

## 2017-08-07 DIAGNOSIS — R412 Retrograde amnesia: Secondary | ICD-10-CM | POA: Diagnosis not present

## 2017-08-07 DIAGNOSIS — M810 Age-related osteoporosis without current pathological fracture: Secondary | ICD-10-CM | POA: Diagnosis not present

## 2017-08-07 DIAGNOSIS — I10 Essential (primary) hypertension: Secondary | ICD-10-CM | POA: Diagnosis not present

## 2017-08-08 DIAGNOSIS — M810 Age-related osteoporosis without current pathological fracture: Secondary | ICD-10-CM | POA: Diagnosis not present

## 2017-08-08 DIAGNOSIS — R412 Retrograde amnesia: Secondary | ICD-10-CM | POA: Diagnosis not present

## 2017-08-08 DIAGNOSIS — I10 Essential (primary) hypertension: Secondary | ICD-10-CM | POA: Diagnosis not present

## 2017-08-08 DIAGNOSIS — F419 Anxiety disorder, unspecified: Secondary | ICD-10-CM | POA: Diagnosis not present

## 2017-08-09 DIAGNOSIS — I10 Essential (primary) hypertension: Secondary | ICD-10-CM | POA: Diagnosis not present

## 2017-08-09 DIAGNOSIS — R412 Retrograde amnesia: Secondary | ICD-10-CM | POA: Diagnosis not present

## 2017-08-09 DIAGNOSIS — F419 Anxiety disorder, unspecified: Secondary | ICD-10-CM | POA: Diagnosis not present

## 2017-08-09 DIAGNOSIS — M810 Age-related osteoporosis without current pathological fracture: Secondary | ICD-10-CM | POA: Diagnosis not present

## 2017-08-10 DIAGNOSIS — F419 Anxiety disorder, unspecified: Secondary | ICD-10-CM | POA: Diagnosis not present

## 2017-08-10 DIAGNOSIS — M810 Age-related osteoporosis without current pathological fracture: Secondary | ICD-10-CM | POA: Diagnosis not present

## 2017-08-10 DIAGNOSIS — R412 Retrograde amnesia: Secondary | ICD-10-CM | POA: Diagnosis not present

## 2017-08-10 DIAGNOSIS — I10 Essential (primary) hypertension: Secondary | ICD-10-CM | POA: Diagnosis not present

## 2017-08-11 DIAGNOSIS — F419 Anxiety disorder, unspecified: Secondary | ICD-10-CM | POA: Diagnosis not present

## 2017-08-11 DIAGNOSIS — I10 Essential (primary) hypertension: Secondary | ICD-10-CM | POA: Diagnosis not present

## 2017-08-11 DIAGNOSIS — R412 Retrograde amnesia: Secondary | ICD-10-CM | POA: Diagnosis not present

## 2017-08-11 DIAGNOSIS — M810 Age-related osteoporosis without current pathological fracture: Secondary | ICD-10-CM | POA: Diagnosis not present

## 2017-08-12 DIAGNOSIS — I10 Essential (primary) hypertension: Secondary | ICD-10-CM | POA: Diagnosis not present

## 2017-08-12 DIAGNOSIS — F419 Anxiety disorder, unspecified: Secondary | ICD-10-CM | POA: Diagnosis not present

## 2017-08-12 DIAGNOSIS — R412 Retrograde amnesia: Secondary | ICD-10-CM | POA: Diagnosis not present

## 2017-08-12 DIAGNOSIS — M810 Age-related osteoporosis without current pathological fracture: Secondary | ICD-10-CM | POA: Diagnosis not present

## 2017-08-13 DIAGNOSIS — I10 Essential (primary) hypertension: Secondary | ICD-10-CM | POA: Diagnosis not present

## 2017-08-13 DIAGNOSIS — M810 Age-related osteoporosis without current pathological fracture: Secondary | ICD-10-CM | POA: Diagnosis not present

## 2017-08-13 DIAGNOSIS — R412 Retrograde amnesia: Secondary | ICD-10-CM | POA: Diagnosis not present

## 2017-08-13 DIAGNOSIS — F419 Anxiety disorder, unspecified: Secondary | ICD-10-CM | POA: Diagnosis not present

## 2017-08-14 DIAGNOSIS — R412 Retrograde amnesia: Secondary | ICD-10-CM | POA: Diagnosis not present

## 2017-08-14 DIAGNOSIS — I10 Essential (primary) hypertension: Secondary | ICD-10-CM | POA: Diagnosis not present

## 2017-08-14 DIAGNOSIS — M810 Age-related osteoporosis without current pathological fracture: Secondary | ICD-10-CM | POA: Diagnosis not present

## 2017-08-14 DIAGNOSIS — F419 Anxiety disorder, unspecified: Secondary | ICD-10-CM | POA: Diagnosis not present

## 2017-08-15 DIAGNOSIS — F419 Anxiety disorder, unspecified: Secondary | ICD-10-CM | POA: Diagnosis not present

## 2017-08-15 DIAGNOSIS — I10 Essential (primary) hypertension: Secondary | ICD-10-CM | POA: Diagnosis not present

## 2017-08-15 DIAGNOSIS — R412 Retrograde amnesia: Secondary | ICD-10-CM | POA: Diagnosis not present

## 2017-08-15 DIAGNOSIS — M810 Age-related osteoporosis without current pathological fracture: Secondary | ICD-10-CM | POA: Diagnosis not present

## 2017-08-16 DIAGNOSIS — R412 Retrograde amnesia: Secondary | ICD-10-CM | POA: Diagnosis not present

## 2017-08-16 DIAGNOSIS — F419 Anxiety disorder, unspecified: Secondary | ICD-10-CM | POA: Diagnosis not present

## 2017-08-16 DIAGNOSIS — I10 Essential (primary) hypertension: Secondary | ICD-10-CM | POA: Diagnosis not present

## 2017-08-16 DIAGNOSIS — M810 Age-related osteoporosis without current pathological fracture: Secondary | ICD-10-CM | POA: Diagnosis not present

## 2017-08-17 DIAGNOSIS — R412 Retrograde amnesia: Secondary | ICD-10-CM | POA: Diagnosis not present

## 2017-08-17 DIAGNOSIS — F419 Anxiety disorder, unspecified: Secondary | ICD-10-CM | POA: Diagnosis not present

## 2017-08-17 DIAGNOSIS — I10 Essential (primary) hypertension: Secondary | ICD-10-CM | POA: Diagnosis not present

## 2017-08-17 DIAGNOSIS — M810 Age-related osteoporosis without current pathological fracture: Secondary | ICD-10-CM | POA: Diagnosis not present

## 2017-08-18 DIAGNOSIS — I10 Essential (primary) hypertension: Secondary | ICD-10-CM | POA: Diagnosis not present

## 2017-08-18 DIAGNOSIS — M810 Age-related osteoporosis without current pathological fracture: Secondary | ICD-10-CM | POA: Diagnosis not present

## 2017-08-18 DIAGNOSIS — R412 Retrograde amnesia: Secondary | ICD-10-CM | POA: Diagnosis not present

## 2017-08-18 DIAGNOSIS — F419 Anxiety disorder, unspecified: Secondary | ICD-10-CM | POA: Diagnosis not present

## 2017-08-19 DIAGNOSIS — F419 Anxiety disorder, unspecified: Secondary | ICD-10-CM | POA: Diagnosis not present

## 2017-08-19 DIAGNOSIS — M810 Age-related osteoporosis without current pathological fracture: Secondary | ICD-10-CM | POA: Diagnosis not present

## 2017-08-19 DIAGNOSIS — I10 Essential (primary) hypertension: Secondary | ICD-10-CM | POA: Diagnosis not present

## 2017-08-19 DIAGNOSIS — R412 Retrograde amnesia: Secondary | ICD-10-CM | POA: Diagnosis not present

## 2017-08-20 DIAGNOSIS — I10 Essential (primary) hypertension: Secondary | ICD-10-CM | POA: Diagnosis not present

## 2017-08-20 DIAGNOSIS — R412 Retrograde amnesia: Secondary | ICD-10-CM | POA: Diagnosis not present

## 2017-08-20 DIAGNOSIS — F419 Anxiety disorder, unspecified: Secondary | ICD-10-CM | POA: Diagnosis not present

## 2017-08-20 DIAGNOSIS — M810 Age-related osteoporosis without current pathological fracture: Secondary | ICD-10-CM | POA: Diagnosis not present

## 2017-08-21 DIAGNOSIS — I10 Essential (primary) hypertension: Secondary | ICD-10-CM | POA: Diagnosis not present

## 2017-08-21 DIAGNOSIS — R412 Retrograde amnesia: Secondary | ICD-10-CM | POA: Diagnosis not present

## 2017-08-21 DIAGNOSIS — M810 Age-related osteoporosis without current pathological fracture: Secondary | ICD-10-CM | POA: Diagnosis not present

## 2017-08-21 DIAGNOSIS — F419 Anxiety disorder, unspecified: Secondary | ICD-10-CM | POA: Diagnosis not present

## 2017-08-22 DIAGNOSIS — I10 Essential (primary) hypertension: Secondary | ICD-10-CM | POA: Diagnosis not present

## 2017-08-22 DIAGNOSIS — R412 Retrograde amnesia: Secondary | ICD-10-CM | POA: Diagnosis not present

## 2017-08-22 DIAGNOSIS — F419 Anxiety disorder, unspecified: Secondary | ICD-10-CM | POA: Diagnosis not present

## 2017-08-22 DIAGNOSIS — M810 Age-related osteoporosis without current pathological fracture: Secondary | ICD-10-CM | POA: Diagnosis not present

## 2017-08-23 DIAGNOSIS — I10 Essential (primary) hypertension: Secondary | ICD-10-CM | POA: Diagnosis not present

## 2017-08-23 DIAGNOSIS — R412 Retrograde amnesia: Secondary | ICD-10-CM | POA: Diagnosis not present

## 2017-08-23 DIAGNOSIS — F419 Anxiety disorder, unspecified: Secondary | ICD-10-CM | POA: Diagnosis not present

## 2017-08-23 DIAGNOSIS — M810 Age-related osteoporosis without current pathological fracture: Secondary | ICD-10-CM | POA: Diagnosis not present

## 2017-08-24 DIAGNOSIS — M810 Age-related osteoporosis without current pathological fracture: Secondary | ICD-10-CM | POA: Diagnosis not present

## 2017-08-24 DIAGNOSIS — R412 Retrograde amnesia: Secondary | ICD-10-CM | POA: Diagnosis not present

## 2017-08-24 DIAGNOSIS — I10 Essential (primary) hypertension: Secondary | ICD-10-CM | POA: Diagnosis not present

## 2017-08-24 DIAGNOSIS — F419 Anxiety disorder, unspecified: Secondary | ICD-10-CM | POA: Diagnosis not present

## 2017-08-25 DIAGNOSIS — M810 Age-related osteoporosis without current pathological fracture: Secondary | ICD-10-CM | POA: Diagnosis not present

## 2017-08-25 DIAGNOSIS — I10 Essential (primary) hypertension: Secondary | ICD-10-CM | POA: Diagnosis not present

## 2017-08-25 DIAGNOSIS — F419 Anxiety disorder, unspecified: Secondary | ICD-10-CM | POA: Diagnosis not present

## 2017-08-25 DIAGNOSIS — R412 Retrograde amnesia: Secondary | ICD-10-CM | POA: Diagnosis not present

## 2017-08-26 DIAGNOSIS — R412 Retrograde amnesia: Secondary | ICD-10-CM | POA: Diagnosis not present

## 2017-08-26 DIAGNOSIS — F419 Anxiety disorder, unspecified: Secondary | ICD-10-CM | POA: Diagnosis not present

## 2017-08-26 DIAGNOSIS — I10 Essential (primary) hypertension: Secondary | ICD-10-CM | POA: Diagnosis not present

## 2017-08-26 DIAGNOSIS — M810 Age-related osteoporosis without current pathological fracture: Secondary | ICD-10-CM | POA: Diagnosis not present

## 2017-08-27 DIAGNOSIS — R412 Retrograde amnesia: Secondary | ICD-10-CM | POA: Diagnosis not present

## 2017-08-27 DIAGNOSIS — F419 Anxiety disorder, unspecified: Secondary | ICD-10-CM | POA: Diagnosis not present

## 2017-08-27 DIAGNOSIS — M810 Age-related osteoporosis without current pathological fracture: Secondary | ICD-10-CM | POA: Diagnosis not present

## 2017-08-27 DIAGNOSIS — I10 Essential (primary) hypertension: Secondary | ICD-10-CM | POA: Diagnosis not present

## 2017-08-28 DIAGNOSIS — M810 Age-related osteoporosis without current pathological fracture: Secondary | ICD-10-CM | POA: Diagnosis not present

## 2017-08-28 DIAGNOSIS — F419 Anxiety disorder, unspecified: Secondary | ICD-10-CM | POA: Diagnosis not present

## 2017-08-28 DIAGNOSIS — I10 Essential (primary) hypertension: Secondary | ICD-10-CM | POA: Diagnosis not present

## 2017-08-28 DIAGNOSIS — R412 Retrograde amnesia: Secondary | ICD-10-CM | POA: Diagnosis not present

## 2017-08-29 DIAGNOSIS — R412 Retrograde amnesia: Secondary | ICD-10-CM | POA: Diagnosis not present

## 2017-08-29 DIAGNOSIS — F419 Anxiety disorder, unspecified: Secondary | ICD-10-CM | POA: Diagnosis not present

## 2017-08-29 DIAGNOSIS — I10 Essential (primary) hypertension: Secondary | ICD-10-CM | POA: Diagnosis not present

## 2017-08-29 DIAGNOSIS — M810 Age-related osteoporosis without current pathological fracture: Secondary | ICD-10-CM | POA: Diagnosis not present

## 2017-08-30 DIAGNOSIS — I10 Essential (primary) hypertension: Secondary | ICD-10-CM | POA: Diagnosis not present

## 2017-08-30 DIAGNOSIS — F419 Anxiety disorder, unspecified: Secondary | ICD-10-CM | POA: Diagnosis not present

## 2017-08-30 DIAGNOSIS — R412 Retrograde amnesia: Secondary | ICD-10-CM | POA: Diagnosis not present

## 2017-08-30 DIAGNOSIS — M810 Age-related osteoporosis without current pathological fracture: Secondary | ICD-10-CM | POA: Diagnosis not present

## 2017-08-31 DIAGNOSIS — M810 Age-related osteoporosis without current pathological fracture: Secondary | ICD-10-CM | POA: Diagnosis not present

## 2017-08-31 DIAGNOSIS — I10 Essential (primary) hypertension: Secondary | ICD-10-CM | POA: Diagnosis not present

## 2017-08-31 DIAGNOSIS — R412 Retrograde amnesia: Secondary | ICD-10-CM | POA: Diagnosis not present

## 2017-08-31 DIAGNOSIS — F419 Anxiety disorder, unspecified: Secondary | ICD-10-CM | POA: Diagnosis not present

## 2017-09-01 DIAGNOSIS — F419 Anxiety disorder, unspecified: Secondary | ICD-10-CM | POA: Diagnosis not present

## 2017-09-01 DIAGNOSIS — M810 Age-related osteoporosis without current pathological fracture: Secondary | ICD-10-CM | POA: Diagnosis not present

## 2017-09-01 DIAGNOSIS — R412 Retrograde amnesia: Secondary | ICD-10-CM | POA: Diagnosis not present

## 2017-09-01 DIAGNOSIS — I10 Essential (primary) hypertension: Secondary | ICD-10-CM | POA: Diagnosis not present

## 2017-09-02 DIAGNOSIS — R412 Retrograde amnesia: Secondary | ICD-10-CM | POA: Diagnosis not present

## 2017-09-02 DIAGNOSIS — I10 Essential (primary) hypertension: Secondary | ICD-10-CM | POA: Diagnosis not present

## 2017-09-02 DIAGNOSIS — M810 Age-related osteoporosis without current pathological fracture: Secondary | ICD-10-CM | POA: Diagnosis not present

## 2017-09-02 DIAGNOSIS — F419 Anxiety disorder, unspecified: Secondary | ICD-10-CM | POA: Diagnosis not present

## 2017-09-03 DIAGNOSIS — R412 Retrograde amnesia: Secondary | ICD-10-CM | POA: Diagnosis not present

## 2017-09-03 DIAGNOSIS — M810 Age-related osteoporosis without current pathological fracture: Secondary | ICD-10-CM | POA: Diagnosis not present

## 2017-09-03 DIAGNOSIS — F419 Anxiety disorder, unspecified: Secondary | ICD-10-CM | POA: Diagnosis not present

## 2017-09-03 DIAGNOSIS — I10 Essential (primary) hypertension: Secondary | ICD-10-CM | POA: Diagnosis not present

## 2017-09-04 DIAGNOSIS — I10 Essential (primary) hypertension: Secondary | ICD-10-CM | POA: Diagnosis not present

## 2017-09-04 DIAGNOSIS — R412 Retrograde amnesia: Secondary | ICD-10-CM | POA: Diagnosis not present

## 2017-09-04 DIAGNOSIS — F419 Anxiety disorder, unspecified: Secondary | ICD-10-CM | POA: Diagnosis not present

## 2017-09-04 DIAGNOSIS — M810 Age-related osteoporosis without current pathological fracture: Secondary | ICD-10-CM | POA: Diagnosis not present

## 2017-09-05 DIAGNOSIS — I10 Essential (primary) hypertension: Secondary | ICD-10-CM | POA: Diagnosis not present

## 2017-09-05 DIAGNOSIS — M810 Age-related osteoporosis without current pathological fracture: Secondary | ICD-10-CM | POA: Diagnosis not present

## 2017-09-05 DIAGNOSIS — F419 Anxiety disorder, unspecified: Secondary | ICD-10-CM | POA: Diagnosis not present

## 2017-09-05 DIAGNOSIS — R412 Retrograde amnesia: Secondary | ICD-10-CM | POA: Diagnosis not present

## 2017-09-06 DIAGNOSIS — R412 Retrograde amnesia: Secondary | ICD-10-CM | POA: Diagnosis not present

## 2017-09-06 DIAGNOSIS — M810 Age-related osteoporosis without current pathological fracture: Secondary | ICD-10-CM | POA: Diagnosis not present

## 2017-09-06 DIAGNOSIS — F419 Anxiety disorder, unspecified: Secondary | ICD-10-CM | POA: Diagnosis not present

## 2017-09-06 DIAGNOSIS — I10 Essential (primary) hypertension: Secondary | ICD-10-CM | POA: Diagnosis not present

## 2017-09-07 DIAGNOSIS — I10 Essential (primary) hypertension: Secondary | ICD-10-CM | POA: Diagnosis not present

## 2017-09-07 DIAGNOSIS — M810 Age-related osteoporosis without current pathological fracture: Secondary | ICD-10-CM | POA: Diagnosis not present

## 2017-09-07 DIAGNOSIS — F419 Anxiety disorder, unspecified: Secondary | ICD-10-CM | POA: Diagnosis not present

## 2017-09-07 DIAGNOSIS — R412 Retrograde amnesia: Secondary | ICD-10-CM | POA: Diagnosis not present

## 2017-09-08 DIAGNOSIS — F419 Anxiety disorder, unspecified: Secondary | ICD-10-CM | POA: Diagnosis not present

## 2017-09-08 DIAGNOSIS — R412 Retrograde amnesia: Secondary | ICD-10-CM | POA: Diagnosis not present

## 2017-09-08 DIAGNOSIS — M810 Age-related osteoporosis without current pathological fracture: Secondary | ICD-10-CM | POA: Diagnosis not present

## 2017-09-08 DIAGNOSIS — I10 Essential (primary) hypertension: Secondary | ICD-10-CM | POA: Diagnosis not present

## 2017-09-09 DIAGNOSIS — M810 Age-related osteoporosis without current pathological fracture: Secondary | ICD-10-CM | POA: Diagnosis not present

## 2017-09-09 DIAGNOSIS — I10 Essential (primary) hypertension: Secondary | ICD-10-CM | POA: Diagnosis not present

## 2017-09-09 DIAGNOSIS — R412 Retrograde amnesia: Secondary | ICD-10-CM | POA: Diagnosis not present

## 2017-09-09 DIAGNOSIS — F419 Anxiety disorder, unspecified: Secondary | ICD-10-CM | POA: Diagnosis not present

## 2017-09-10 DIAGNOSIS — F419 Anxiety disorder, unspecified: Secondary | ICD-10-CM | POA: Diagnosis not present

## 2017-09-10 DIAGNOSIS — R412 Retrograde amnesia: Secondary | ICD-10-CM | POA: Diagnosis not present

## 2017-09-10 DIAGNOSIS — I10 Essential (primary) hypertension: Secondary | ICD-10-CM | POA: Diagnosis not present

## 2017-09-10 DIAGNOSIS — M810 Age-related osteoporosis without current pathological fracture: Secondary | ICD-10-CM | POA: Diagnosis not present

## 2017-09-11 DIAGNOSIS — F419 Anxiety disorder, unspecified: Secondary | ICD-10-CM | POA: Diagnosis not present

## 2017-09-11 DIAGNOSIS — I10 Essential (primary) hypertension: Secondary | ICD-10-CM | POA: Diagnosis not present

## 2017-09-11 DIAGNOSIS — M810 Age-related osteoporosis without current pathological fracture: Secondary | ICD-10-CM | POA: Diagnosis not present

## 2017-09-11 DIAGNOSIS — R412 Retrograde amnesia: Secondary | ICD-10-CM | POA: Diagnosis not present

## 2017-09-12 DIAGNOSIS — F419 Anxiety disorder, unspecified: Secondary | ICD-10-CM | POA: Diagnosis not present

## 2017-09-12 DIAGNOSIS — I10 Essential (primary) hypertension: Secondary | ICD-10-CM | POA: Diagnosis not present

## 2017-09-12 DIAGNOSIS — R412 Retrograde amnesia: Secondary | ICD-10-CM | POA: Diagnosis not present

## 2017-09-12 DIAGNOSIS — M810 Age-related osteoporosis without current pathological fracture: Secondary | ICD-10-CM | POA: Diagnosis not present

## 2017-09-13 DIAGNOSIS — I10 Essential (primary) hypertension: Secondary | ICD-10-CM | POA: Diagnosis not present

## 2017-09-13 DIAGNOSIS — R412 Retrograde amnesia: Secondary | ICD-10-CM | POA: Diagnosis not present

## 2017-09-13 DIAGNOSIS — F419 Anxiety disorder, unspecified: Secondary | ICD-10-CM | POA: Diagnosis not present

## 2017-09-13 DIAGNOSIS — M810 Age-related osteoporosis without current pathological fracture: Secondary | ICD-10-CM | POA: Diagnosis not present

## 2017-09-14 DIAGNOSIS — F419 Anxiety disorder, unspecified: Secondary | ICD-10-CM | POA: Diagnosis not present

## 2017-09-14 DIAGNOSIS — I10 Essential (primary) hypertension: Secondary | ICD-10-CM | POA: Diagnosis not present

## 2017-09-14 DIAGNOSIS — M810 Age-related osteoporosis without current pathological fracture: Secondary | ICD-10-CM | POA: Diagnosis not present

## 2017-09-14 DIAGNOSIS — R412 Retrograde amnesia: Secondary | ICD-10-CM | POA: Diagnosis not present

## 2017-09-15 DIAGNOSIS — M810 Age-related osteoporosis without current pathological fracture: Secondary | ICD-10-CM | POA: Diagnosis not present

## 2017-09-15 DIAGNOSIS — R412 Retrograde amnesia: Secondary | ICD-10-CM | POA: Diagnosis not present

## 2017-09-15 DIAGNOSIS — I10 Essential (primary) hypertension: Secondary | ICD-10-CM | POA: Diagnosis not present

## 2017-09-15 DIAGNOSIS — F419 Anxiety disorder, unspecified: Secondary | ICD-10-CM | POA: Diagnosis not present

## 2017-09-16 DIAGNOSIS — R412 Retrograde amnesia: Secondary | ICD-10-CM | POA: Diagnosis not present

## 2017-09-16 DIAGNOSIS — M810 Age-related osteoporosis without current pathological fracture: Secondary | ICD-10-CM | POA: Diagnosis not present

## 2017-09-16 DIAGNOSIS — I10 Essential (primary) hypertension: Secondary | ICD-10-CM | POA: Diagnosis not present

## 2017-09-16 DIAGNOSIS — F419 Anxiety disorder, unspecified: Secondary | ICD-10-CM | POA: Diagnosis not present

## 2017-09-17 DIAGNOSIS — I10 Essential (primary) hypertension: Secondary | ICD-10-CM | POA: Diagnosis not present

## 2017-09-17 DIAGNOSIS — R412 Retrograde amnesia: Secondary | ICD-10-CM | POA: Diagnosis not present

## 2017-09-17 DIAGNOSIS — F419 Anxiety disorder, unspecified: Secondary | ICD-10-CM | POA: Diagnosis not present

## 2017-09-17 DIAGNOSIS — M810 Age-related osteoporosis without current pathological fracture: Secondary | ICD-10-CM | POA: Diagnosis not present

## 2017-09-18 DIAGNOSIS — F419 Anxiety disorder, unspecified: Secondary | ICD-10-CM | POA: Diagnosis not present

## 2017-09-18 DIAGNOSIS — R412 Retrograde amnesia: Secondary | ICD-10-CM | POA: Diagnosis not present

## 2017-09-18 DIAGNOSIS — M810 Age-related osteoporosis without current pathological fracture: Secondary | ICD-10-CM | POA: Diagnosis not present

## 2017-09-18 DIAGNOSIS — I10 Essential (primary) hypertension: Secondary | ICD-10-CM | POA: Diagnosis not present

## 2017-09-19 DIAGNOSIS — M810 Age-related osteoporosis without current pathological fracture: Secondary | ICD-10-CM | POA: Diagnosis not present

## 2017-09-19 DIAGNOSIS — Z6824 Body mass index (BMI) 24.0-24.9, adult: Secondary | ICD-10-CM | POA: Diagnosis not present

## 2017-09-19 DIAGNOSIS — R412 Retrograde amnesia: Secondary | ICD-10-CM | POA: Diagnosis not present

## 2017-09-19 DIAGNOSIS — I10 Essential (primary) hypertension: Secondary | ICD-10-CM | POA: Diagnosis not present

## 2017-09-19 DIAGNOSIS — F419 Anxiety disorder, unspecified: Secondary | ICD-10-CM | POA: Diagnosis not present

## 2017-09-19 DIAGNOSIS — K219 Gastro-esophageal reflux disease without esophagitis: Secondary | ICD-10-CM | POA: Diagnosis not present

## 2017-09-20 DIAGNOSIS — R412 Retrograde amnesia: Secondary | ICD-10-CM | POA: Diagnosis not present

## 2017-09-20 DIAGNOSIS — M810 Age-related osteoporosis without current pathological fracture: Secondary | ICD-10-CM | POA: Diagnosis not present

## 2017-09-20 DIAGNOSIS — F419 Anxiety disorder, unspecified: Secondary | ICD-10-CM | POA: Diagnosis not present

## 2017-09-20 DIAGNOSIS — I10 Essential (primary) hypertension: Secondary | ICD-10-CM | POA: Diagnosis not present

## 2017-09-21 DIAGNOSIS — I10 Essential (primary) hypertension: Secondary | ICD-10-CM | POA: Diagnosis not present

## 2017-09-21 DIAGNOSIS — R412 Retrograde amnesia: Secondary | ICD-10-CM | POA: Diagnosis not present

## 2017-09-21 DIAGNOSIS — F419 Anxiety disorder, unspecified: Secondary | ICD-10-CM | POA: Diagnosis not present

## 2017-09-21 DIAGNOSIS — M810 Age-related osteoporosis without current pathological fracture: Secondary | ICD-10-CM | POA: Diagnosis not present

## 2017-09-22 DIAGNOSIS — I10 Essential (primary) hypertension: Secondary | ICD-10-CM | POA: Diagnosis not present

## 2017-09-22 DIAGNOSIS — F419 Anxiety disorder, unspecified: Secondary | ICD-10-CM | POA: Diagnosis not present

## 2017-09-22 DIAGNOSIS — R412 Retrograde amnesia: Secondary | ICD-10-CM | POA: Diagnosis not present

## 2017-09-22 DIAGNOSIS — M810 Age-related osteoporosis without current pathological fracture: Secondary | ICD-10-CM | POA: Diagnosis not present

## 2017-09-23 DIAGNOSIS — M810 Age-related osteoporosis without current pathological fracture: Secondary | ICD-10-CM | POA: Diagnosis not present

## 2017-09-23 DIAGNOSIS — R412 Retrograde amnesia: Secondary | ICD-10-CM | POA: Diagnosis not present

## 2017-09-23 DIAGNOSIS — F419 Anxiety disorder, unspecified: Secondary | ICD-10-CM | POA: Diagnosis not present

## 2017-09-23 DIAGNOSIS — I10 Essential (primary) hypertension: Secondary | ICD-10-CM | POA: Diagnosis not present

## 2017-09-24 DIAGNOSIS — M810 Age-related osteoporosis without current pathological fracture: Secondary | ICD-10-CM | POA: Diagnosis not present

## 2017-09-24 DIAGNOSIS — F419 Anxiety disorder, unspecified: Secondary | ICD-10-CM | POA: Diagnosis not present

## 2017-09-24 DIAGNOSIS — R412 Retrograde amnesia: Secondary | ICD-10-CM | POA: Diagnosis not present

## 2017-09-24 DIAGNOSIS — I10 Essential (primary) hypertension: Secondary | ICD-10-CM | POA: Diagnosis not present

## 2017-09-25 DIAGNOSIS — R412 Retrograde amnesia: Secondary | ICD-10-CM | POA: Diagnosis not present

## 2017-09-25 DIAGNOSIS — F419 Anxiety disorder, unspecified: Secondary | ICD-10-CM | POA: Diagnosis not present

## 2017-09-25 DIAGNOSIS — M810 Age-related osteoporosis without current pathological fracture: Secondary | ICD-10-CM | POA: Diagnosis not present

## 2017-09-25 DIAGNOSIS — I10 Essential (primary) hypertension: Secondary | ICD-10-CM | POA: Diagnosis not present

## 2017-09-26 DIAGNOSIS — R412 Retrograde amnesia: Secondary | ICD-10-CM | POA: Diagnosis not present

## 2017-09-26 DIAGNOSIS — F419 Anxiety disorder, unspecified: Secondary | ICD-10-CM | POA: Diagnosis not present

## 2017-09-26 DIAGNOSIS — I10 Essential (primary) hypertension: Secondary | ICD-10-CM | POA: Diagnosis not present

## 2017-09-26 DIAGNOSIS — M810 Age-related osteoporosis without current pathological fracture: Secondary | ICD-10-CM | POA: Diagnosis not present

## 2017-09-27 DIAGNOSIS — I10 Essential (primary) hypertension: Secondary | ICD-10-CM | POA: Diagnosis not present

## 2017-09-27 DIAGNOSIS — F419 Anxiety disorder, unspecified: Secondary | ICD-10-CM | POA: Diagnosis not present

## 2017-09-27 DIAGNOSIS — M810 Age-related osteoporosis without current pathological fracture: Secondary | ICD-10-CM | POA: Diagnosis not present

## 2017-09-27 DIAGNOSIS — R412 Retrograde amnesia: Secondary | ICD-10-CM | POA: Diagnosis not present

## 2017-09-28 DIAGNOSIS — I10 Essential (primary) hypertension: Secondary | ICD-10-CM | POA: Diagnosis not present

## 2017-09-28 DIAGNOSIS — R412 Retrograde amnesia: Secondary | ICD-10-CM | POA: Diagnosis not present

## 2017-09-28 DIAGNOSIS — F419 Anxiety disorder, unspecified: Secondary | ICD-10-CM | POA: Diagnosis not present

## 2017-09-28 DIAGNOSIS — M810 Age-related osteoporosis without current pathological fracture: Secondary | ICD-10-CM | POA: Diagnosis not present

## 2017-09-29 DIAGNOSIS — R412 Retrograde amnesia: Secondary | ICD-10-CM | POA: Diagnosis not present

## 2017-09-29 DIAGNOSIS — I10 Essential (primary) hypertension: Secondary | ICD-10-CM | POA: Diagnosis not present

## 2017-09-29 DIAGNOSIS — M810 Age-related osteoporosis without current pathological fracture: Secondary | ICD-10-CM | POA: Diagnosis not present

## 2017-09-29 DIAGNOSIS — F419 Anxiety disorder, unspecified: Secondary | ICD-10-CM | POA: Diagnosis not present

## 2017-09-30 DIAGNOSIS — M810 Age-related osteoporosis without current pathological fracture: Secondary | ICD-10-CM | POA: Diagnosis not present

## 2017-09-30 DIAGNOSIS — F419 Anxiety disorder, unspecified: Secondary | ICD-10-CM | POA: Diagnosis not present

## 2017-09-30 DIAGNOSIS — I10 Essential (primary) hypertension: Secondary | ICD-10-CM | POA: Diagnosis not present

## 2017-09-30 DIAGNOSIS — R412 Retrograde amnesia: Secondary | ICD-10-CM | POA: Diagnosis not present

## 2017-10-01 DIAGNOSIS — F419 Anxiety disorder, unspecified: Secondary | ICD-10-CM | POA: Diagnosis not present

## 2017-10-01 DIAGNOSIS — R412 Retrograde amnesia: Secondary | ICD-10-CM | POA: Diagnosis not present

## 2017-10-01 DIAGNOSIS — M810 Age-related osteoporosis without current pathological fracture: Secondary | ICD-10-CM | POA: Diagnosis not present

## 2017-10-01 DIAGNOSIS — I10 Essential (primary) hypertension: Secondary | ICD-10-CM | POA: Diagnosis not present

## 2017-10-02 DIAGNOSIS — F419 Anxiety disorder, unspecified: Secondary | ICD-10-CM | POA: Diagnosis not present

## 2017-10-02 DIAGNOSIS — I10 Essential (primary) hypertension: Secondary | ICD-10-CM | POA: Diagnosis not present

## 2017-10-02 DIAGNOSIS — M810 Age-related osteoporosis without current pathological fracture: Secondary | ICD-10-CM | POA: Diagnosis not present

## 2017-10-02 DIAGNOSIS — R412 Retrograde amnesia: Secondary | ICD-10-CM | POA: Diagnosis not present

## 2017-10-03 DIAGNOSIS — F419 Anxiety disorder, unspecified: Secondary | ICD-10-CM | POA: Diagnosis not present

## 2017-10-03 DIAGNOSIS — R412 Retrograde amnesia: Secondary | ICD-10-CM | POA: Diagnosis not present

## 2017-10-03 DIAGNOSIS — I10 Essential (primary) hypertension: Secondary | ICD-10-CM | POA: Diagnosis not present

## 2017-10-03 DIAGNOSIS — M810 Age-related osteoporosis without current pathological fracture: Secondary | ICD-10-CM | POA: Diagnosis not present

## 2017-10-04 DIAGNOSIS — F419 Anxiety disorder, unspecified: Secondary | ICD-10-CM | POA: Diagnosis not present

## 2017-10-04 DIAGNOSIS — M810 Age-related osteoporosis without current pathological fracture: Secondary | ICD-10-CM | POA: Diagnosis not present

## 2017-10-04 DIAGNOSIS — R412 Retrograde amnesia: Secondary | ICD-10-CM | POA: Diagnosis not present

## 2017-10-04 DIAGNOSIS — I10 Essential (primary) hypertension: Secondary | ICD-10-CM | POA: Diagnosis not present

## 2017-10-05 DIAGNOSIS — R412 Retrograde amnesia: Secondary | ICD-10-CM | POA: Diagnosis not present

## 2017-10-05 DIAGNOSIS — M810 Age-related osteoporosis without current pathological fracture: Secondary | ICD-10-CM | POA: Diagnosis not present

## 2017-10-05 DIAGNOSIS — I10 Essential (primary) hypertension: Secondary | ICD-10-CM | POA: Diagnosis not present

## 2017-10-05 DIAGNOSIS — F419 Anxiety disorder, unspecified: Secondary | ICD-10-CM | POA: Diagnosis not present

## 2017-10-06 DIAGNOSIS — M810 Age-related osteoporosis without current pathological fracture: Secondary | ICD-10-CM | POA: Diagnosis not present

## 2017-10-06 DIAGNOSIS — R412 Retrograde amnesia: Secondary | ICD-10-CM | POA: Diagnosis not present

## 2017-10-06 DIAGNOSIS — I10 Essential (primary) hypertension: Secondary | ICD-10-CM | POA: Diagnosis not present

## 2017-10-06 DIAGNOSIS — F419 Anxiety disorder, unspecified: Secondary | ICD-10-CM | POA: Diagnosis not present

## 2017-10-07 DIAGNOSIS — M810 Age-related osteoporosis without current pathological fracture: Secondary | ICD-10-CM | POA: Diagnosis not present

## 2017-10-07 DIAGNOSIS — I10 Essential (primary) hypertension: Secondary | ICD-10-CM | POA: Diagnosis not present

## 2017-10-07 DIAGNOSIS — F419 Anxiety disorder, unspecified: Secondary | ICD-10-CM | POA: Diagnosis not present

## 2017-10-07 DIAGNOSIS — R412 Retrograde amnesia: Secondary | ICD-10-CM | POA: Diagnosis not present

## 2017-10-08 DIAGNOSIS — R412 Retrograde amnesia: Secondary | ICD-10-CM | POA: Diagnosis not present

## 2017-10-08 DIAGNOSIS — I10 Essential (primary) hypertension: Secondary | ICD-10-CM | POA: Diagnosis not present

## 2017-10-08 DIAGNOSIS — M810 Age-related osteoporosis without current pathological fracture: Secondary | ICD-10-CM | POA: Diagnosis not present

## 2017-10-08 DIAGNOSIS — F419 Anxiety disorder, unspecified: Secondary | ICD-10-CM | POA: Diagnosis not present

## 2017-10-09 DIAGNOSIS — F419 Anxiety disorder, unspecified: Secondary | ICD-10-CM | POA: Diagnosis not present

## 2017-10-09 DIAGNOSIS — I10 Essential (primary) hypertension: Secondary | ICD-10-CM | POA: Diagnosis not present

## 2017-10-09 DIAGNOSIS — M810 Age-related osteoporosis without current pathological fracture: Secondary | ICD-10-CM | POA: Diagnosis not present

## 2017-10-09 DIAGNOSIS — R412 Retrograde amnesia: Secondary | ICD-10-CM | POA: Diagnosis not present

## 2017-10-10 DIAGNOSIS — I10 Essential (primary) hypertension: Secondary | ICD-10-CM | POA: Diagnosis not present

## 2017-10-10 DIAGNOSIS — F419 Anxiety disorder, unspecified: Secondary | ICD-10-CM | POA: Diagnosis not present

## 2017-10-10 DIAGNOSIS — R412 Retrograde amnesia: Secondary | ICD-10-CM | POA: Diagnosis not present

## 2017-10-10 DIAGNOSIS — M810 Age-related osteoporosis without current pathological fracture: Secondary | ICD-10-CM | POA: Diagnosis not present

## 2017-10-11 DIAGNOSIS — I10 Essential (primary) hypertension: Secondary | ICD-10-CM | POA: Diagnosis not present

## 2017-10-11 DIAGNOSIS — M810 Age-related osteoporosis without current pathological fracture: Secondary | ICD-10-CM | POA: Diagnosis not present

## 2017-10-11 DIAGNOSIS — R412 Retrograde amnesia: Secondary | ICD-10-CM | POA: Diagnosis not present

## 2017-10-11 DIAGNOSIS — F419 Anxiety disorder, unspecified: Secondary | ICD-10-CM | POA: Diagnosis not present

## 2017-10-12 DIAGNOSIS — M810 Age-related osteoporosis without current pathological fracture: Secondary | ICD-10-CM | POA: Diagnosis not present

## 2017-10-12 DIAGNOSIS — R412 Retrograde amnesia: Secondary | ICD-10-CM | POA: Diagnosis not present

## 2017-10-12 DIAGNOSIS — I10 Essential (primary) hypertension: Secondary | ICD-10-CM | POA: Diagnosis not present

## 2017-10-12 DIAGNOSIS — F419 Anxiety disorder, unspecified: Secondary | ICD-10-CM | POA: Diagnosis not present

## 2017-10-13 DIAGNOSIS — R412 Retrograde amnesia: Secondary | ICD-10-CM | POA: Diagnosis not present

## 2017-10-13 DIAGNOSIS — M810 Age-related osteoporosis without current pathological fracture: Secondary | ICD-10-CM | POA: Diagnosis not present

## 2017-10-13 DIAGNOSIS — F419 Anxiety disorder, unspecified: Secondary | ICD-10-CM | POA: Diagnosis not present

## 2017-10-13 DIAGNOSIS — I10 Essential (primary) hypertension: Secondary | ICD-10-CM | POA: Diagnosis not present

## 2017-10-14 DIAGNOSIS — F419 Anxiety disorder, unspecified: Secondary | ICD-10-CM | POA: Diagnosis not present

## 2017-10-14 DIAGNOSIS — M810 Age-related osteoporosis without current pathological fracture: Secondary | ICD-10-CM | POA: Diagnosis not present

## 2017-10-14 DIAGNOSIS — I10 Essential (primary) hypertension: Secondary | ICD-10-CM | POA: Diagnosis not present

## 2017-10-14 DIAGNOSIS — R412 Retrograde amnesia: Secondary | ICD-10-CM | POA: Diagnosis not present

## 2017-10-15 DIAGNOSIS — M810 Age-related osteoporosis without current pathological fracture: Secondary | ICD-10-CM | POA: Diagnosis not present

## 2017-10-15 DIAGNOSIS — I10 Essential (primary) hypertension: Secondary | ICD-10-CM | POA: Diagnosis not present

## 2017-10-15 DIAGNOSIS — F419 Anxiety disorder, unspecified: Secondary | ICD-10-CM | POA: Diagnosis not present

## 2017-10-15 DIAGNOSIS — R412 Retrograde amnesia: Secondary | ICD-10-CM | POA: Diagnosis not present

## 2017-10-16 DIAGNOSIS — F419 Anxiety disorder, unspecified: Secondary | ICD-10-CM | POA: Diagnosis not present

## 2017-10-16 DIAGNOSIS — M810 Age-related osteoporosis without current pathological fracture: Secondary | ICD-10-CM | POA: Diagnosis not present

## 2017-10-16 DIAGNOSIS — I10 Essential (primary) hypertension: Secondary | ICD-10-CM | POA: Diagnosis not present

## 2017-10-16 DIAGNOSIS — R412 Retrograde amnesia: Secondary | ICD-10-CM | POA: Diagnosis not present

## 2017-10-17 DIAGNOSIS — M810 Age-related osteoporosis without current pathological fracture: Secondary | ICD-10-CM | POA: Diagnosis not present

## 2017-10-17 DIAGNOSIS — R412 Retrograde amnesia: Secondary | ICD-10-CM | POA: Diagnosis not present

## 2017-10-17 DIAGNOSIS — I10 Essential (primary) hypertension: Secondary | ICD-10-CM | POA: Diagnosis not present

## 2017-10-17 DIAGNOSIS — F419 Anxiety disorder, unspecified: Secondary | ICD-10-CM | POA: Diagnosis not present

## 2017-10-18 DIAGNOSIS — F419 Anxiety disorder, unspecified: Secondary | ICD-10-CM | POA: Diagnosis not present

## 2017-10-18 DIAGNOSIS — R412 Retrograde amnesia: Secondary | ICD-10-CM | POA: Diagnosis not present

## 2017-10-18 DIAGNOSIS — M810 Age-related osteoporosis without current pathological fracture: Secondary | ICD-10-CM | POA: Diagnosis not present

## 2017-10-18 DIAGNOSIS — I10 Essential (primary) hypertension: Secondary | ICD-10-CM | POA: Diagnosis not present

## 2017-10-19 DIAGNOSIS — R412 Retrograde amnesia: Secondary | ICD-10-CM | POA: Diagnosis not present

## 2017-10-19 DIAGNOSIS — M810 Age-related osteoporosis without current pathological fracture: Secondary | ICD-10-CM | POA: Diagnosis not present

## 2017-10-19 DIAGNOSIS — F419 Anxiety disorder, unspecified: Secondary | ICD-10-CM | POA: Diagnosis not present

## 2017-10-19 DIAGNOSIS — I10 Essential (primary) hypertension: Secondary | ICD-10-CM | POA: Diagnosis not present

## 2017-10-20 DIAGNOSIS — I10 Essential (primary) hypertension: Secondary | ICD-10-CM | POA: Diagnosis not present

## 2017-10-20 DIAGNOSIS — F419 Anxiety disorder, unspecified: Secondary | ICD-10-CM | POA: Diagnosis not present

## 2017-10-20 DIAGNOSIS — M810 Age-related osteoporosis without current pathological fracture: Secondary | ICD-10-CM | POA: Diagnosis not present

## 2017-10-20 DIAGNOSIS — R412 Retrograde amnesia: Secondary | ICD-10-CM | POA: Diagnosis not present

## 2017-10-21 DIAGNOSIS — F419 Anxiety disorder, unspecified: Secondary | ICD-10-CM | POA: Diagnosis not present

## 2017-10-21 DIAGNOSIS — I10 Essential (primary) hypertension: Secondary | ICD-10-CM | POA: Diagnosis not present

## 2017-10-21 DIAGNOSIS — R412 Retrograde amnesia: Secondary | ICD-10-CM | POA: Diagnosis not present

## 2017-10-21 DIAGNOSIS — M810 Age-related osteoporosis without current pathological fracture: Secondary | ICD-10-CM | POA: Diagnosis not present

## 2017-10-22 DIAGNOSIS — I10 Essential (primary) hypertension: Secondary | ICD-10-CM | POA: Diagnosis not present

## 2017-10-22 DIAGNOSIS — R412 Retrograde amnesia: Secondary | ICD-10-CM | POA: Diagnosis not present

## 2017-10-22 DIAGNOSIS — M810 Age-related osteoporosis without current pathological fracture: Secondary | ICD-10-CM | POA: Diagnosis not present

## 2017-10-22 DIAGNOSIS — F419 Anxiety disorder, unspecified: Secondary | ICD-10-CM | POA: Diagnosis not present

## 2017-10-23 DIAGNOSIS — R412 Retrograde amnesia: Secondary | ICD-10-CM | POA: Diagnosis not present

## 2017-10-23 DIAGNOSIS — I10 Essential (primary) hypertension: Secondary | ICD-10-CM | POA: Diagnosis not present

## 2017-10-23 DIAGNOSIS — F419 Anxiety disorder, unspecified: Secondary | ICD-10-CM | POA: Diagnosis not present

## 2017-10-23 DIAGNOSIS — M810 Age-related osteoporosis without current pathological fracture: Secondary | ICD-10-CM | POA: Diagnosis not present

## 2017-10-24 DIAGNOSIS — M810 Age-related osteoporosis without current pathological fracture: Secondary | ICD-10-CM | POA: Diagnosis not present

## 2017-10-24 DIAGNOSIS — F419 Anxiety disorder, unspecified: Secondary | ICD-10-CM | POA: Diagnosis not present

## 2017-10-24 DIAGNOSIS — R412 Retrograde amnesia: Secondary | ICD-10-CM | POA: Diagnosis not present

## 2017-10-24 DIAGNOSIS — I10 Essential (primary) hypertension: Secondary | ICD-10-CM | POA: Diagnosis not present

## 2017-10-25 DIAGNOSIS — R412 Retrograde amnesia: Secondary | ICD-10-CM | POA: Diagnosis not present

## 2017-10-25 DIAGNOSIS — F419 Anxiety disorder, unspecified: Secondary | ICD-10-CM | POA: Diagnosis not present

## 2017-10-25 DIAGNOSIS — I10 Essential (primary) hypertension: Secondary | ICD-10-CM | POA: Diagnosis not present

## 2017-10-25 DIAGNOSIS — M810 Age-related osteoporosis without current pathological fracture: Secondary | ICD-10-CM | POA: Diagnosis not present

## 2017-10-26 DIAGNOSIS — F419 Anxiety disorder, unspecified: Secondary | ICD-10-CM | POA: Diagnosis not present

## 2017-10-26 DIAGNOSIS — I10 Essential (primary) hypertension: Secondary | ICD-10-CM | POA: Diagnosis not present

## 2017-10-26 DIAGNOSIS — M810 Age-related osteoporosis without current pathological fracture: Secondary | ICD-10-CM | POA: Diagnosis not present

## 2017-10-26 DIAGNOSIS — R412 Retrograde amnesia: Secondary | ICD-10-CM | POA: Diagnosis not present

## 2017-10-27 DIAGNOSIS — M810 Age-related osteoporosis without current pathological fracture: Secondary | ICD-10-CM | POA: Diagnosis not present

## 2017-10-27 DIAGNOSIS — F419 Anxiety disorder, unspecified: Secondary | ICD-10-CM | POA: Diagnosis not present

## 2017-10-27 DIAGNOSIS — R412 Retrograde amnesia: Secondary | ICD-10-CM | POA: Diagnosis not present

## 2017-10-27 DIAGNOSIS — I10 Essential (primary) hypertension: Secondary | ICD-10-CM | POA: Diagnosis not present

## 2017-10-28 DIAGNOSIS — R412 Retrograde amnesia: Secondary | ICD-10-CM | POA: Diagnosis not present

## 2017-10-28 DIAGNOSIS — M810 Age-related osteoporosis without current pathological fracture: Secondary | ICD-10-CM | POA: Diagnosis not present

## 2017-10-28 DIAGNOSIS — I10 Essential (primary) hypertension: Secondary | ICD-10-CM | POA: Diagnosis not present

## 2017-10-28 DIAGNOSIS — F419 Anxiety disorder, unspecified: Secondary | ICD-10-CM | POA: Diagnosis not present

## 2017-10-29 DIAGNOSIS — F419 Anxiety disorder, unspecified: Secondary | ICD-10-CM | POA: Diagnosis not present

## 2017-10-29 DIAGNOSIS — I10 Essential (primary) hypertension: Secondary | ICD-10-CM | POA: Diagnosis not present

## 2017-10-29 DIAGNOSIS — R412 Retrograde amnesia: Secondary | ICD-10-CM | POA: Diagnosis not present

## 2017-10-29 DIAGNOSIS — M810 Age-related osteoporosis without current pathological fracture: Secondary | ICD-10-CM | POA: Diagnosis not present

## 2017-10-30 DIAGNOSIS — I10 Essential (primary) hypertension: Secondary | ICD-10-CM | POA: Diagnosis not present

## 2017-10-30 DIAGNOSIS — M810 Age-related osteoporosis without current pathological fracture: Secondary | ICD-10-CM | POA: Diagnosis not present

## 2017-10-30 DIAGNOSIS — R412 Retrograde amnesia: Secondary | ICD-10-CM | POA: Diagnosis not present

## 2017-10-30 DIAGNOSIS — F419 Anxiety disorder, unspecified: Secondary | ICD-10-CM | POA: Diagnosis not present

## 2017-10-31 DIAGNOSIS — R412 Retrograde amnesia: Secondary | ICD-10-CM | POA: Diagnosis not present

## 2017-10-31 DIAGNOSIS — M810 Age-related osteoporosis without current pathological fracture: Secondary | ICD-10-CM | POA: Diagnosis not present

## 2017-10-31 DIAGNOSIS — I10 Essential (primary) hypertension: Secondary | ICD-10-CM | POA: Diagnosis not present

## 2017-10-31 DIAGNOSIS — F419 Anxiety disorder, unspecified: Secondary | ICD-10-CM | POA: Diagnosis not present

## 2017-11-01 DIAGNOSIS — M810 Age-related osteoporosis without current pathological fracture: Secondary | ICD-10-CM | POA: Diagnosis not present

## 2017-11-01 DIAGNOSIS — R412 Retrograde amnesia: Secondary | ICD-10-CM | POA: Diagnosis not present

## 2017-11-01 DIAGNOSIS — F419 Anxiety disorder, unspecified: Secondary | ICD-10-CM | POA: Diagnosis not present

## 2017-11-01 DIAGNOSIS — I10 Essential (primary) hypertension: Secondary | ICD-10-CM | POA: Diagnosis not present

## 2017-11-02 DIAGNOSIS — F419 Anxiety disorder, unspecified: Secondary | ICD-10-CM | POA: Diagnosis not present

## 2017-11-02 DIAGNOSIS — I10 Essential (primary) hypertension: Secondary | ICD-10-CM | POA: Diagnosis not present

## 2017-11-02 DIAGNOSIS — M810 Age-related osteoporosis without current pathological fracture: Secondary | ICD-10-CM | POA: Diagnosis not present

## 2017-11-02 DIAGNOSIS — R412 Retrograde amnesia: Secondary | ICD-10-CM | POA: Diagnosis not present

## 2017-11-03 DIAGNOSIS — I10 Essential (primary) hypertension: Secondary | ICD-10-CM | POA: Diagnosis not present

## 2017-11-03 DIAGNOSIS — M810 Age-related osteoporosis without current pathological fracture: Secondary | ICD-10-CM | POA: Diagnosis not present

## 2017-11-03 DIAGNOSIS — R412 Retrograde amnesia: Secondary | ICD-10-CM | POA: Diagnosis not present

## 2017-11-03 DIAGNOSIS — F419 Anxiety disorder, unspecified: Secondary | ICD-10-CM | POA: Diagnosis not present

## 2017-11-04 DIAGNOSIS — M810 Age-related osteoporosis without current pathological fracture: Secondary | ICD-10-CM | POA: Diagnosis not present

## 2017-11-04 DIAGNOSIS — F419 Anxiety disorder, unspecified: Secondary | ICD-10-CM | POA: Diagnosis not present

## 2017-11-04 DIAGNOSIS — I10 Essential (primary) hypertension: Secondary | ICD-10-CM | POA: Diagnosis not present

## 2017-11-04 DIAGNOSIS — R412 Retrograde amnesia: Secondary | ICD-10-CM | POA: Diagnosis not present

## 2017-11-05 DIAGNOSIS — M810 Age-related osteoporosis without current pathological fracture: Secondary | ICD-10-CM | POA: Diagnosis not present

## 2017-11-05 DIAGNOSIS — R412 Retrograde amnesia: Secondary | ICD-10-CM | POA: Diagnosis not present

## 2017-11-05 DIAGNOSIS — I10 Essential (primary) hypertension: Secondary | ICD-10-CM | POA: Diagnosis not present

## 2017-11-05 DIAGNOSIS — F419 Anxiety disorder, unspecified: Secondary | ICD-10-CM | POA: Diagnosis not present

## 2017-11-06 DIAGNOSIS — R412 Retrograde amnesia: Secondary | ICD-10-CM | POA: Diagnosis not present

## 2017-11-06 DIAGNOSIS — I10 Essential (primary) hypertension: Secondary | ICD-10-CM | POA: Diagnosis not present

## 2017-11-06 DIAGNOSIS — F419 Anxiety disorder, unspecified: Secondary | ICD-10-CM | POA: Diagnosis not present

## 2017-11-06 DIAGNOSIS — M810 Age-related osteoporosis without current pathological fracture: Secondary | ICD-10-CM | POA: Diagnosis not present

## 2017-11-07 DIAGNOSIS — M810 Age-related osteoporosis without current pathological fracture: Secondary | ICD-10-CM | POA: Diagnosis not present

## 2017-11-07 DIAGNOSIS — I10 Essential (primary) hypertension: Secondary | ICD-10-CM | POA: Diagnosis not present

## 2017-11-07 DIAGNOSIS — R412 Retrograde amnesia: Secondary | ICD-10-CM | POA: Diagnosis not present

## 2017-11-07 DIAGNOSIS — F419 Anxiety disorder, unspecified: Secondary | ICD-10-CM | POA: Diagnosis not present

## 2017-11-08 DIAGNOSIS — R412 Retrograde amnesia: Secondary | ICD-10-CM | POA: Diagnosis not present

## 2017-11-08 DIAGNOSIS — I10 Essential (primary) hypertension: Secondary | ICD-10-CM | POA: Diagnosis not present

## 2017-11-08 DIAGNOSIS — F419 Anxiety disorder, unspecified: Secondary | ICD-10-CM | POA: Diagnosis not present

## 2017-11-08 DIAGNOSIS — M810 Age-related osteoporosis without current pathological fracture: Secondary | ICD-10-CM | POA: Diagnosis not present

## 2017-11-09 DIAGNOSIS — I10 Essential (primary) hypertension: Secondary | ICD-10-CM | POA: Diagnosis not present

## 2017-11-09 DIAGNOSIS — M810 Age-related osteoporosis without current pathological fracture: Secondary | ICD-10-CM | POA: Diagnosis not present

## 2017-11-09 DIAGNOSIS — R412 Retrograde amnesia: Secondary | ICD-10-CM | POA: Diagnosis not present

## 2017-11-09 DIAGNOSIS — F419 Anxiety disorder, unspecified: Secondary | ICD-10-CM | POA: Diagnosis not present

## 2017-11-10 DIAGNOSIS — F419 Anxiety disorder, unspecified: Secondary | ICD-10-CM | POA: Diagnosis not present

## 2017-11-10 DIAGNOSIS — M810 Age-related osteoporosis without current pathological fracture: Secondary | ICD-10-CM | POA: Diagnosis not present

## 2017-11-10 DIAGNOSIS — I10 Essential (primary) hypertension: Secondary | ICD-10-CM | POA: Diagnosis not present

## 2017-11-10 DIAGNOSIS — R412 Retrograde amnesia: Secondary | ICD-10-CM | POA: Diagnosis not present

## 2017-11-11 DIAGNOSIS — I10 Essential (primary) hypertension: Secondary | ICD-10-CM | POA: Diagnosis not present

## 2017-11-11 DIAGNOSIS — R412 Retrograde amnesia: Secondary | ICD-10-CM | POA: Diagnosis not present

## 2017-11-11 DIAGNOSIS — M810 Age-related osteoporosis without current pathological fracture: Secondary | ICD-10-CM | POA: Diagnosis not present

## 2017-11-11 DIAGNOSIS — F419 Anxiety disorder, unspecified: Secondary | ICD-10-CM | POA: Diagnosis not present

## 2017-11-12 DIAGNOSIS — M810 Age-related osteoporosis without current pathological fracture: Secondary | ICD-10-CM | POA: Diagnosis not present

## 2017-11-12 DIAGNOSIS — I10 Essential (primary) hypertension: Secondary | ICD-10-CM | POA: Diagnosis not present

## 2017-11-12 DIAGNOSIS — F419 Anxiety disorder, unspecified: Secondary | ICD-10-CM | POA: Diagnosis not present

## 2017-11-12 DIAGNOSIS — R412 Retrograde amnesia: Secondary | ICD-10-CM | POA: Diagnosis not present

## 2017-11-13 DIAGNOSIS — F419 Anxiety disorder, unspecified: Secondary | ICD-10-CM | POA: Diagnosis not present

## 2017-11-13 DIAGNOSIS — M810 Age-related osteoporosis without current pathological fracture: Secondary | ICD-10-CM | POA: Diagnosis not present

## 2017-11-13 DIAGNOSIS — I10 Essential (primary) hypertension: Secondary | ICD-10-CM | POA: Diagnosis not present

## 2017-11-13 DIAGNOSIS — R412 Retrograde amnesia: Secondary | ICD-10-CM | POA: Diagnosis not present

## 2017-11-14 DIAGNOSIS — R412 Retrograde amnesia: Secondary | ICD-10-CM | POA: Diagnosis not present

## 2017-11-14 DIAGNOSIS — I10 Essential (primary) hypertension: Secondary | ICD-10-CM | POA: Diagnosis not present

## 2017-11-14 DIAGNOSIS — F419 Anxiety disorder, unspecified: Secondary | ICD-10-CM | POA: Diagnosis not present

## 2017-11-14 DIAGNOSIS — M810 Age-related osteoporosis without current pathological fracture: Secondary | ICD-10-CM | POA: Diagnosis not present

## 2017-11-15 DIAGNOSIS — I10 Essential (primary) hypertension: Secondary | ICD-10-CM | POA: Diagnosis not present

## 2017-11-15 DIAGNOSIS — M810 Age-related osteoporosis without current pathological fracture: Secondary | ICD-10-CM | POA: Diagnosis not present

## 2017-11-15 DIAGNOSIS — F419 Anxiety disorder, unspecified: Secondary | ICD-10-CM | POA: Diagnosis not present

## 2017-11-15 DIAGNOSIS — R412 Retrograde amnesia: Secondary | ICD-10-CM | POA: Diagnosis not present

## 2017-11-16 DIAGNOSIS — R412 Retrograde amnesia: Secondary | ICD-10-CM | POA: Diagnosis not present

## 2017-11-16 DIAGNOSIS — I10 Essential (primary) hypertension: Secondary | ICD-10-CM | POA: Diagnosis not present

## 2017-11-16 DIAGNOSIS — F419 Anxiety disorder, unspecified: Secondary | ICD-10-CM | POA: Diagnosis not present

## 2017-11-16 DIAGNOSIS — M810 Age-related osteoporosis without current pathological fracture: Secondary | ICD-10-CM | POA: Diagnosis not present

## 2017-11-17 DIAGNOSIS — R412 Retrograde amnesia: Secondary | ICD-10-CM | POA: Diagnosis not present

## 2017-11-17 DIAGNOSIS — I10 Essential (primary) hypertension: Secondary | ICD-10-CM | POA: Diagnosis not present

## 2017-11-17 DIAGNOSIS — M810 Age-related osteoporosis without current pathological fracture: Secondary | ICD-10-CM | POA: Diagnosis not present

## 2017-11-17 DIAGNOSIS — F419 Anxiety disorder, unspecified: Secondary | ICD-10-CM | POA: Diagnosis not present

## 2017-11-18 DIAGNOSIS — I10 Essential (primary) hypertension: Secondary | ICD-10-CM | POA: Diagnosis not present

## 2017-11-18 DIAGNOSIS — F419 Anxiety disorder, unspecified: Secondary | ICD-10-CM | POA: Diagnosis not present

## 2017-11-18 DIAGNOSIS — R412 Retrograde amnesia: Secondary | ICD-10-CM | POA: Diagnosis not present

## 2017-11-18 DIAGNOSIS — M810 Age-related osteoporosis without current pathological fracture: Secondary | ICD-10-CM | POA: Diagnosis not present

## 2017-11-19 DIAGNOSIS — R412 Retrograde amnesia: Secondary | ICD-10-CM | POA: Diagnosis not present

## 2017-11-19 DIAGNOSIS — M810 Age-related osteoporosis without current pathological fracture: Secondary | ICD-10-CM | POA: Diagnosis not present

## 2017-11-19 DIAGNOSIS — F419 Anxiety disorder, unspecified: Secondary | ICD-10-CM | POA: Diagnosis not present

## 2017-11-19 DIAGNOSIS — I10 Essential (primary) hypertension: Secondary | ICD-10-CM | POA: Diagnosis not present

## 2017-11-20 DIAGNOSIS — M810 Age-related osteoporosis without current pathological fracture: Secondary | ICD-10-CM | POA: Diagnosis not present

## 2017-11-20 DIAGNOSIS — R412 Retrograde amnesia: Secondary | ICD-10-CM | POA: Diagnosis not present

## 2017-11-20 DIAGNOSIS — I10 Essential (primary) hypertension: Secondary | ICD-10-CM | POA: Diagnosis not present

## 2017-11-20 DIAGNOSIS — F419 Anxiety disorder, unspecified: Secondary | ICD-10-CM | POA: Diagnosis not present

## 2017-11-21 DIAGNOSIS — I10 Essential (primary) hypertension: Secondary | ICD-10-CM | POA: Diagnosis not present

## 2017-11-21 DIAGNOSIS — M810 Age-related osteoporosis without current pathological fracture: Secondary | ICD-10-CM | POA: Diagnosis not present

## 2017-11-21 DIAGNOSIS — R412 Retrograde amnesia: Secondary | ICD-10-CM | POA: Diagnosis not present

## 2017-11-21 DIAGNOSIS — F419 Anxiety disorder, unspecified: Secondary | ICD-10-CM | POA: Diagnosis not present

## 2017-11-22 DIAGNOSIS — R412 Retrograde amnesia: Secondary | ICD-10-CM | POA: Diagnosis not present

## 2017-11-22 DIAGNOSIS — F419 Anxiety disorder, unspecified: Secondary | ICD-10-CM | POA: Diagnosis not present

## 2017-11-22 DIAGNOSIS — I10 Essential (primary) hypertension: Secondary | ICD-10-CM | POA: Diagnosis not present

## 2017-11-22 DIAGNOSIS — M810 Age-related osteoporosis without current pathological fracture: Secondary | ICD-10-CM | POA: Diagnosis not present

## 2017-11-23 DIAGNOSIS — R412 Retrograde amnesia: Secondary | ICD-10-CM | POA: Diagnosis not present

## 2017-11-23 DIAGNOSIS — M810 Age-related osteoporosis without current pathological fracture: Secondary | ICD-10-CM | POA: Diagnosis not present

## 2017-11-23 DIAGNOSIS — I10 Essential (primary) hypertension: Secondary | ICD-10-CM | POA: Diagnosis not present

## 2017-11-23 DIAGNOSIS — F419 Anxiety disorder, unspecified: Secondary | ICD-10-CM | POA: Diagnosis not present

## 2017-11-24 DIAGNOSIS — R412 Retrograde amnesia: Secondary | ICD-10-CM | POA: Diagnosis not present

## 2017-11-24 DIAGNOSIS — F419 Anxiety disorder, unspecified: Secondary | ICD-10-CM | POA: Diagnosis not present

## 2017-11-24 DIAGNOSIS — I10 Essential (primary) hypertension: Secondary | ICD-10-CM | POA: Diagnosis not present

## 2017-11-24 DIAGNOSIS — M810 Age-related osteoporosis without current pathological fracture: Secondary | ICD-10-CM | POA: Diagnosis not present

## 2017-11-25 DIAGNOSIS — I10 Essential (primary) hypertension: Secondary | ICD-10-CM | POA: Diagnosis not present

## 2017-11-25 DIAGNOSIS — F419 Anxiety disorder, unspecified: Secondary | ICD-10-CM | POA: Diagnosis not present

## 2017-11-25 DIAGNOSIS — M810 Age-related osteoporosis without current pathological fracture: Secondary | ICD-10-CM | POA: Diagnosis not present

## 2017-11-25 DIAGNOSIS — R412 Retrograde amnesia: Secondary | ICD-10-CM | POA: Diagnosis not present

## 2017-11-26 DIAGNOSIS — M810 Age-related osteoporosis without current pathological fracture: Secondary | ICD-10-CM | POA: Diagnosis not present

## 2017-11-26 DIAGNOSIS — R412 Retrograde amnesia: Secondary | ICD-10-CM | POA: Diagnosis not present

## 2017-11-26 DIAGNOSIS — I10 Essential (primary) hypertension: Secondary | ICD-10-CM | POA: Diagnosis not present

## 2017-11-26 DIAGNOSIS — F419 Anxiety disorder, unspecified: Secondary | ICD-10-CM | POA: Diagnosis not present

## 2017-11-27 DIAGNOSIS — I10 Essential (primary) hypertension: Secondary | ICD-10-CM | POA: Diagnosis not present

## 2017-11-27 DIAGNOSIS — F419 Anxiety disorder, unspecified: Secondary | ICD-10-CM | POA: Diagnosis not present

## 2017-11-27 DIAGNOSIS — R412 Retrograde amnesia: Secondary | ICD-10-CM | POA: Diagnosis not present

## 2017-11-27 DIAGNOSIS — M810 Age-related osteoporosis without current pathological fracture: Secondary | ICD-10-CM | POA: Diagnosis not present

## 2017-11-28 DIAGNOSIS — R412 Retrograde amnesia: Secondary | ICD-10-CM | POA: Diagnosis not present

## 2017-11-28 DIAGNOSIS — I10 Essential (primary) hypertension: Secondary | ICD-10-CM | POA: Diagnosis not present

## 2017-11-28 DIAGNOSIS — M810 Age-related osteoporosis without current pathological fracture: Secondary | ICD-10-CM | POA: Diagnosis not present

## 2017-11-28 DIAGNOSIS — F419 Anxiety disorder, unspecified: Secondary | ICD-10-CM | POA: Diagnosis not present

## 2017-11-29 DIAGNOSIS — R412 Retrograde amnesia: Secondary | ICD-10-CM | POA: Diagnosis not present

## 2017-11-29 DIAGNOSIS — M810 Age-related osteoporosis without current pathological fracture: Secondary | ICD-10-CM | POA: Diagnosis not present

## 2017-11-29 DIAGNOSIS — F419 Anxiety disorder, unspecified: Secondary | ICD-10-CM | POA: Diagnosis not present

## 2017-11-29 DIAGNOSIS — I10 Essential (primary) hypertension: Secondary | ICD-10-CM | POA: Diagnosis not present

## 2017-11-30 DIAGNOSIS — M810 Age-related osteoporosis without current pathological fracture: Secondary | ICD-10-CM | POA: Diagnosis not present

## 2017-11-30 DIAGNOSIS — F419 Anxiety disorder, unspecified: Secondary | ICD-10-CM | POA: Diagnosis not present

## 2017-11-30 DIAGNOSIS — R412 Retrograde amnesia: Secondary | ICD-10-CM | POA: Diagnosis not present

## 2017-11-30 DIAGNOSIS — I10 Essential (primary) hypertension: Secondary | ICD-10-CM | POA: Diagnosis not present

## 2017-12-01 DIAGNOSIS — R1314 Dysphagia, pharyngoesophageal phase: Secondary | ICD-10-CM | POA: Diagnosis not present

## 2017-12-01 DIAGNOSIS — Z6823 Body mass index (BMI) 23.0-23.9, adult: Secondary | ICD-10-CM | POA: Diagnosis not present

## 2017-12-01 DIAGNOSIS — M810 Age-related osteoporosis without current pathological fracture: Secondary | ICD-10-CM | POA: Diagnosis not present

## 2017-12-01 DIAGNOSIS — I10 Essential (primary) hypertension: Secondary | ICD-10-CM | POA: Diagnosis not present

## 2017-12-01 DIAGNOSIS — F419 Anxiety disorder, unspecified: Secondary | ICD-10-CM | POA: Diagnosis not present

## 2017-12-01 DIAGNOSIS — R412 Retrograde amnesia: Secondary | ICD-10-CM | POA: Diagnosis not present

## 2017-12-02 DIAGNOSIS — M810 Age-related osteoporosis without current pathological fracture: Secondary | ICD-10-CM | POA: Diagnosis not present

## 2017-12-02 DIAGNOSIS — I10 Essential (primary) hypertension: Secondary | ICD-10-CM | POA: Diagnosis not present

## 2017-12-02 DIAGNOSIS — R412 Retrograde amnesia: Secondary | ICD-10-CM | POA: Diagnosis not present

## 2017-12-02 DIAGNOSIS — F419 Anxiety disorder, unspecified: Secondary | ICD-10-CM | POA: Diagnosis not present

## 2017-12-03 DIAGNOSIS — M810 Age-related osteoporosis without current pathological fracture: Secondary | ICD-10-CM | POA: Diagnosis not present

## 2017-12-03 DIAGNOSIS — R412 Retrograde amnesia: Secondary | ICD-10-CM | POA: Diagnosis not present

## 2017-12-03 DIAGNOSIS — F419 Anxiety disorder, unspecified: Secondary | ICD-10-CM | POA: Diagnosis not present

## 2017-12-03 DIAGNOSIS — I10 Essential (primary) hypertension: Secondary | ICD-10-CM | POA: Diagnosis not present

## 2017-12-04 DIAGNOSIS — M810 Age-related osteoporosis without current pathological fracture: Secondary | ICD-10-CM | POA: Diagnosis not present

## 2017-12-04 DIAGNOSIS — I10 Essential (primary) hypertension: Secondary | ICD-10-CM | POA: Diagnosis not present

## 2017-12-04 DIAGNOSIS — F419 Anxiety disorder, unspecified: Secondary | ICD-10-CM | POA: Diagnosis not present

## 2017-12-04 DIAGNOSIS — R412 Retrograde amnesia: Secondary | ICD-10-CM | POA: Diagnosis not present

## 2017-12-05 DIAGNOSIS — R412 Retrograde amnesia: Secondary | ICD-10-CM | POA: Diagnosis not present

## 2017-12-05 DIAGNOSIS — F419 Anxiety disorder, unspecified: Secondary | ICD-10-CM | POA: Diagnosis not present

## 2017-12-05 DIAGNOSIS — I10 Essential (primary) hypertension: Secondary | ICD-10-CM | POA: Diagnosis not present

## 2017-12-05 DIAGNOSIS — M810 Age-related osteoporosis without current pathological fracture: Secondary | ICD-10-CM | POA: Diagnosis not present

## 2017-12-06 DIAGNOSIS — I10 Essential (primary) hypertension: Secondary | ICD-10-CM | POA: Diagnosis not present

## 2017-12-06 DIAGNOSIS — M810 Age-related osteoporosis without current pathological fracture: Secondary | ICD-10-CM | POA: Diagnosis not present

## 2017-12-06 DIAGNOSIS — F419 Anxiety disorder, unspecified: Secondary | ICD-10-CM | POA: Diagnosis not present

## 2017-12-06 DIAGNOSIS — R412 Retrograde amnesia: Secondary | ICD-10-CM | POA: Diagnosis not present

## 2017-12-07 DIAGNOSIS — F419 Anxiety disorder, unspecified: Secondary | ICD-10-CM | POA: Diagnosis not present

## 2017-12-07 DIAGNOSIS — R412 Retrograde amnesia: Secondary | ICD-10-CM | POA: Diagnosis not present

## 2017-12-07 DIAGNOSIS — I10 Essential (primary) hypertension: Secondary | ICD-10-CM | POA: Diagnosis not present

## 2017-12-07 DIAGNOSIS — M810 Age-related osteoporosis without current pathological fracture: Secondary | ICD-10-CM | POA: Diagnosis not present

## 2017-12-08 DIAGNOSIS — I10 Essential (primary) hypertension: Secondary | ICD-10-CM | POA: Diagnosis not present

## 2017-12-08 DIAGNOSIS — M810 Age-related osteoporosis without current pathological fracture: Secondary | ICD-10-CM | POA: Diagnosis not present

## 2017-12-08 DIAGNOSIS — F419 Anxiety disorder, unspecified: Secondary | ICD-10-CM | POA: Diagnosis not present

## 2017-12-08 DIAGNOSIS — R412 Retrograde amnesia: Secondary | ICD-10-CM | POA: Diagnosis not present

## 2017-12-09 DIAGNOSIS — I10 Essential (primary) hypertension: Secondary | ICD-10-CM | POA: Diagnosis not present

## 2017-12-09 DIAGNOSIS — F419 Anxiety disorder, unspecified: Secondary | ICD-10-CM | POA: Diagnosis not present

## 2017-12-09 DIAGNOSIS — M810 Age-related osteoporosis without current pathological fracture: Secondary | ICD-10-CM | POA: Diagnosis not present

## 2017-12-09 DIAGNOSIS — Z961 Presence of intraocular lens: Secondary | ICD-10-CM | POA: Diagnosis not present

## 2017-12-09 DIAGNOSIS — R412 Retrograde amnesia: Secondary | ICD-10-CM | POA: Diagnosis not present

## 2017-12-10 DIAGNOSIS — M810 Age-related osteoporosis without current pathological fracture: Secondary | ICD-10-CM | POA: Diagnosis not present

## 2017-12-10 DIAGNOSIS — I10 Essential (primary) hypertension: Secondary | ICD-10-CM | POA: Diagnosis not present

## 2017-12-10 DIAGNOSIS — F419 Anxiety disorder, unspecified: Secondary | ICD-10-CM | POA: Diagnosis not present

## 2017-12-10 DIAGNOSIS — R412 Retrograde amnesia: Secondary | ICD-10-CM | POA: Diagnosis not present

## 2017-12-11 DIAGNOSIS — M810 Age-related osteoporosis without current pathological fracture: Secondary | ICD-10-CM | POA: Diagnosis not present

## 2017-12-11 DIAGNOSIS — F419 Anxiety disorder, unspecified: Secondary | ICD-10-CM | POA: Diagnosis not present

## 2017-12-11 DIAGNOSIS — R412 Retrograde amnesia: Secondary | ICD-10-CM | POA: Diagnosis not present

## 2017-12-11 DIAGNOSIS — I10 Essential (primary) hypertension: Secondary | ICD-10-CM | POA: Diagnosis not present

## 2017-12-12 DIAGNOSIS — R412 Retrograde amnesia: Secondary | ICD-10-CM | POA: Diagnosis not present

## 2017-12-12 DIAGNOSIS — M810 Age-related osteoporosis without current pathological fracture: Secondary | ICD-10-CM | POA: Diagnosis not present

## 2017-12-12 DIAGNOSIS — I10 Essential (primary) hypertension: Secondary | ICD-10-CM | POA: Diagnosis not present

## 2017-12-12 DIAGNOSIS — F419 Anxiety disorder, unspecified: Secondary | ICD-10-CM | POA: Diagnosis not present

## 2017-12-13 DIAGNOSIS — M810 Age-related osteoporosis without current pathological fracture: Secondary | ICD-10-CM | POA: Diagnosis not present

## 2017-12-13 DIAGNOSIS — F419 Anxiety disorder, unspecified: Secondary | ICD-10-CM | POA: Diagnosis not present

## 2017-12-13 DIAGNOSIS — I10 Essential (primary) hypertension: Secondary | ICD-10-CM | POA: Diagnosis not present

## 2017-12-13 DIAGNOSIS — R412 Retrograde amnesia: Secondary | ICD-10-CM | POA: Diagnosis not present

## 2017-12-14 DIAGNOSIS — R412 Retrograde amnesia: Secondary | ICD-10-CM | POA: Diagnosis not present

## 2017-12-14 DIAGNOSIS — F419 Anxiety disorder, unspecified: Secondary | ICD-10-CM | POA: Diagnosis not present

## 2017-12-14 DIAGNOSIS — I10 Essential (primary) hypertension: Secondary | ICD-10-CM | POA: Diagnosis not present

## 2017-12-14 DIAGNOSIS — M810 Age-related osteoporosis without current pathological fracture: Secondary | ICD-10-CM | POA: Diagnosis not present

## 2017-12-15 DIAGNOSIS — I10 Essential (primary) hypertension: Secondary | ICD-10-CM | POA: Diagnosis not present

## 2017-12-15 DIAGNOSIS — M810 Age-related osteoporosis without current pathological fracture: Secondary | ICD-10-CM | POA: Diagnosis not present

## 2017-12-15 DIAGNOSIS — R412 Retrograde amnesia: Secondary | ICD-10-CM | POA: Diagnosis not present

## 2017-12-15 DIAGNOSIS — F419 Anxiety disorder, unspecified: Secondary | ICD-10-CM | POA: Diagnosis not present

## 2017-12-16 DIAGNOSIS — M810 Age-related osteoporosis without current pathological fracture: Secondary | ICD-10-CM | POA: Diagnosis not present

## 2017-12-16 DIAGNOSIS — I10 Essential (primary) hypertension: Secondary | ICD-10-CM | POA: Diagnosis not present

## 2017-12-16 DIAGNOSIS — R412 Retrograde amnesia: Secondary | ICD-10-CM | POA: Diagnosis not present

## 2017-12-16 DIAGNOSIS — F419 Anxiety disorder, unspecified: Secondary | ICD-10-CM | POA: Diagnosis not present

## 2017-12-17 DIAGNOSIS — F419 Anxiety disorder, unspecified: Secondary | ICD-10-CM | POA: Diagnosis not present

## 2017-12-17 DIAGNOSIS — R412 Retrograde amnesia: Secondary | ICD-10-CM | POA: Diagnosis not present

## 2017-12-17 DIAGNOSIS — M810 Age-related osteoporosis without current pathological fracture: Secondary | ICD-10-CM | POA: Diagnosis not present

## 2017-12-17 DIAGNOSIS — I10 Essential (primary) hypertension: Secondary | ICD-10-CM | POA: Diagnosis not present

## 2017-12-18 DIAGNOSIS — F419 Anxiety disorder, unspecified: Secondary | ICD-10-CM | POA: Diagnosis not present

## 2017-12-18 DIAGNOSIS — M810 Age-related osteoporosis without current pathological fracture: Secondary | ICD-10-CM | POA: Diagnosis not present

## 2017-12-18 DIAGNOSIS — I10 Essential (primary) hypertension: Secondary | ICD-10-CM | POA: Diagnosis not present

## 2017-12-18 DIAGNOSIS — R412 Retrograde amnesia: Secondary | ICD-10-CM | POA: Diagnosis not present

## 2017-12-19 DIAGNOSIS — F419 Anxiety disorder, unspecified: Secondary | ICD-10-CM | POA: Diagnosis not present

## 2017-12-19 DIAGNOSIS — M810 Age-related osteoporosis without current pathological fracture: Secondary | ICD-10-CM | POA: Diagnosis not present

## 2017-12-19 DIAGNOSIS — I10 Essential (primary) hypertension: Secondary | ICD-10-CM | POA: Diagnosis not present

## 2017-12-19 DIAGNOSIS — R412 Retrograde amnesia: Secondary | ICD-10-CM | POA: Diagnosis not present

## 2017-12-20 DIAGNOSIS — M810 Age-related osteoporosis without current pathological fracture: Secondary | ICD-10-CM | POA: Diagnosis not present

## 2017-12-20 DIAGNOSIS — I10 Essential (primary) hypertension: Secondary | ICD-10-CM | POA: Diagnosis not present

## 2017-12-20 DIAGNOSIS — R412 Retrograde amnesia: Secondary | ICD-10-CM | POA: Diagnosis not present

## 2017-12-20 DIAGNOSIS — F419 Anxiety disorder, unspecified: Secondary | ICD-10-CM | POA: Diagnosis not present

## 2017-12-21 DIAGNOSIS — M810 Age-related osteoporosis without current pathological fracture: Secondary | ICD-10-CM | POA: Diagnosis not present

## 2017-12-21 DIAGNOSIS — I10 Essential (primary) hypertension: Secondary | ICD-10-CM | POA: Diagnosis not present

## 2017-12-21 DIAGNOSIS — R412 Retrograde amnesia: Secondary | ICD-10-CM | POA: Diagnosis not present

## 2017-12-21 DIAGNOSIS — F419 Anxiety disorder, unspecified: Secondary | ICD-10-CM | POA: Diagnosis not present

## 2017-12-22 DIAGNOSIS — I10 Essential (primary) hypertension: Secondary | ICD-10-CM | POA: Diagnosis not present

## 2017-12-22 DIAGNOSIS — R412 Retrograde amnesia: Secondary | ICD-10-CM | POA: Diagnosis not present

## 2017-12-22 DIAGNOSIS — F419 Anxiety disorder, unspecified: Secondary | ICD-10-CM | POA: Diagnosis not present

## 2017-12-22 DIAGNOSIS — M810 Age-related osteoporosis without current pathological fracture: Secondary | ICD-10-CM | POA: Diagnosis not present

## 2017-12-23 DIAGNOSIS — M810 Age-related osteoporosis without current pathological fracture: Secondary | ICD-10-CM | POA: Diagnosis not present

## 2017-12-23 DIAGNOSIS — I10 Essential (primary) hypertension: Secondary | ICD-10-CM | POA: Diagnosis not present

## 2017-12-23 DIAGNOSIS — R412 Retrograde amnesia: Secondary | ICD-10-CM | POA: Diagnosis not present

## 2017-12-23 DIAGNOSIS — F419 Anxiety disorder, unspecified: Secondary | ICD-10-CM | POA: Diagnosis not present

## 2017-12-24 DIAGNOSIS — F419 Anxiety disorder, unspecified: Secondary | ICD-10-CM | POA: Diagnosis not present

## 2017-12-24 DIAGNOSIS — M810 Age-related osteoporosis without current pathological fracture: Secondary | ICD-10-CM | POA: Diagnosis not present

## 2017-12-24 DIAGNOSIS — I10 Essential (primary) hypertension: Secondary | ICD-10-CM | POA: Diagnosis not present

## 2017-12-24 DIAGNOSIS — R412 Retrograde amnesia: Secondary | ICD-10-CM | POA: Diagnosis not present

## 2017-12-25 DIAGNOSIS — R412 Retrograde amnesia: Secondary | ICD-10-CM | POA: Diagnosis not present

## 2017-12-25 DIAGNOSIS — F419 Anxiety disorder, unspecified: Secondary | ICD-10-CM | POA: Diagnosis not present

## 2017-12-25 DIAGNOSIS — M810 Age-related osteoporosis without current pathological fracture: Secondary | ICD-10-CM | POA: Diagnosis not present

## 2017-12-25 DIAGNOSIS — I10 Essential (primary) hypertension: Secondary | ICD-10-CM | POA: Diagnosis not present

## 2017-12-26 DIAGNOSIS — R412 Retrograde amnesia: Secondary | ICD-10-CM | POA: Diagnosis not present

## 2017-12-26 DIAGNOSIS — M810 Age-related osteoporosis without current pathological fracture: Secondary | ICD-10-CM | POA: Diagnosis not present

## 2017-12-26 DIAGNOSIS — F419 Anxiety disorder, unspecified: Secondary | ICD-10-CM | POA: Diagnosis not present

## 2017-12-26 DIAGNOSIS — I10 Essential (primary) hypertension: Secondary | ICD-10-CM | POA: Diagnosis not present

## 2017-12-27 DIAGNOSIS — F419 Anxiety disorder, unspecified: Secondary | ICD-10-CM | POA: Diagnosis not present

## 2017-12-27 DIAGNOSIS — I10 Essential (primary) hypertension: Secondary | ICD-10-CM | POA: Diagnosis not present

## 2017-12-27 DIAGNOSIS — R412 Retrograde amnesia: Secondary | ICD-10-CM | POA: Diagnosis not present

## 2017-12-27 DIAGNOSIS — M810 Age-related osteoporosis without current pathological fracture: Secondary | ICD-10-CM | POA: Diagnosis not present

## 2017-12-28 DIAGNOSIS — I10 Essential (primary) hypertension: Secondary | ICD-10-CM | POA: Diagnosis not present

## 2017-12-28 DIAGNOSIS — F419 Anxiety disorder, unspecified: Secondary | ICD-10-CM | POA: Diagnosis not present

## 2017-12-28 DIAGNOSIS — M810 Age-related osteoporosis without current pathological fracture: Secondary | ICD-10-CM | POA: Diagnosis not present

## 2017-12-28 DIAGNOSIS — R412 Retrograde amnesia: Secondary | ICD-10-CM | POA: Diagnosis not present

## 2017-12-29 DIAGNOSIS — R412 Retrograde amnesia: Secondary | ICD-10-CM | POA: Diagnosis not present

## 2017-12-29 DIAGNOSIS — I10 Essential (primary) hypertension: Secondary | ICD-10-CM | POA: Diagnosis not present

## 2017-12-29 DIAGNOSIS — M810 Age-related osteoporosis without current pathological fracture: Secondary | ICD-10-CM | POA: Diagnosis not present

## 2017-12-29 DIAGNOSIS — F419 Anxiety disorder, unspecified: Secondary | ICD-10-CM | POA: Diagnosis not present

## 2017-12-30 DIAGNOSIS — M810 Age-related osteoporosis without current pathological fracture: Secondary | ICD-10-CM | POA: Diagnosis not present

## 2017-12-30 DIAGNOSIS — F419 Anxiety disorder, unspecified: Secondary | ICD-10-CM | POA: Diagnosis not present

## 2017-12-30 DIAGNOSIS — I10 Essential (primary) hypertension: Secondary | ICD-10-CM | POA: Diagnosis not present

## 2017-12-30 DIAGNOSIS — R412 Retrograde amnesia: Secondary | ICD-10-CM | POA: Diagnosis not present

## 2017-12-31 DIAGNOSIS — M810 Age-related osteoporosis without current pathological fracture: Secondary | ICD-10-CM | POA: Diagnosis not present

## 2017-12-31 DIAGNOSIS — I10 Essential (primary) hypertension: Secondary | ICD-10-CM | POA: Diagnosis not present

## 2017-12-31 DIAGNOSIS — F419 Anxiety disorder, unspecified: Secondary | ICD-10-CM | POA: Diagnosis not present

## 2017-12-31 DIAGNOSIS — R412 Retrograde amnesia: Secondary | ICD-10-CM | POA: Diagnosis not present

## 2018-01-01 DIAGNOSIS — I10 Essential (primary) hypertension: Secondary | ICD-10-CM | POA: Diagnosis not present

## 2018-01-01 DIAGNOSIS — R412 Retrograde amnesia: Secondary | ICD-10-CM | POA: Diagnosis not present

## 2018-01-01 DIAGNOSIS — F419 Anxiety disorder, unspecified: Secondary | ICD-10-CM | POA: Diagnosis not present

## 2018-01-01 DIAGNOSIS — M810 Age-related osteoporosis without current pathological fracture: Secondary | ICD-10-CM | POA: Diagnosis not present

## 2018-01-02 DIAGNOSIS — F419 Anxiety disorder, unspecified: Secondary | ICD-10-CM | POA: Diagnosis not present

## 2018-01-02 DIAGNOSIS — I10 Essential (primary) hypertension: Secondary | ICD-10-CM | POA: Diagnosis not present

## 2018-01-02 DIAGNOSIS — M810 Age-related osteoporosis without current pathological fracture: Secondary | ICD-10-CM | POA: Diagnosis not present

## 2018-01-02 DIAGNOSIS — R412 Retrograde amnesia: Secondary | ICD-10-CM | POA: Diagnosis not present

## 2018-01-03 DIAGNOSIS — F419 Anxiety disorder, unspecified: Secondary | ICD-10-CM | POA: Diagnosis not present

## 2018-01-03 DIAGNOSIS — I10 Essential (primary) hypertension: Secondary | ICD-10-CM | POA: Diagnosis not present

## 2018-01-03 DIAGNOSIS — R412 Retrograde amnesia: Secondary | ICD-10-CM | POA: Diagnosis not present

## 2018-01-03 DIAGNOSIS — M810 Age-related osteoporosis without current pathological fracture: Secondary | ICD-10-CM | POA: Diagnosis not present

## 2018-01-04 DIAGNOSIS — M810 Age-related osteoporosis without current pathological fracture: Secondary | ICD-10-CM | POA: Diagnosis not present

## 2018-01-04 DIAGNOSIS — F419 Anxiety disorder, unspecified: Secondary | ICD-10-CM | POA: Diagnosis not present

## 2018-01-04 DIAGNOSIS — R412 Retrograde amnesia: Secondary | ICD-10-CM | POA: Diagnosis not present

## 2018-01-04 DIAGNOSIS — I10 Essential (primary) hypertension: Secondary | ICD-10-CM | POA: Diagnosis not present

## 2018-01-05 DIAGNOSIS — F419 Anxiety disorder, unspecified: Secondary | ICD-10-CM | POA: Diagnosis not present

## 2018-01-05 DIAGNOSIS — M810 Age-related osteoporosis without current pathological fracture: Secondary | ICD-10-CM | POA: Diagnosis not present

## 2018-01-05 DIAGNOSIS — R412 Retrograde amnesia: Secondary | ICD-10-CM | POA: Diagnosis not present

## 2018-01-05 DIAGNOSIS — I10 Essential (primary) hypertension: Secondary | ICD-10-CM | POA: Diagnosis not present

## 2018-01-06 DIAGNOSIS — F419 Anxiety disorder, unspecified: Secondary | ICD-10-CM | POA: Diagnosis not present

## 2018-01-06 DIAGNOSIS — I10 Essential (primary) hypertension: Secondary | ICD-10-CM | POA: Diagnosis not present

## 2018-01-06 DIAGNOSIS — R412 Retrograde amnesia: Secondary | ICD-10-CM | POA: Diagnosis not present

## 2018-01-06 DIAGNOSIS — M810 Age-related osteoporosis without current pathological fracture: Secondary | ICD-10-CM | POA: Diagnosis not present

## 2018-01-07 DIAGNOSIS — F419 Anxiety disorder, unspecified: Secondary | ICD-10-CM | POA: Diagnosis not present

## 2018-01-07 DIAGNOSIS — R412 Retrograde amnesia: Secondary | ICD-10-CM | POA: Diagnosis not present

## 2018-01-07 DIAGNOSIS — I10 Essential (primary) hypertension: Secondary | ICD-10-CM | POA: Diagnosis not present

## 2018-01-07 DIAGNOSIS — M810 Age-related osteoporosis without current pathological fracture: Secondary | ICD-10-CM | POA: Diagnosis not present

## 2018-01-08 DIAGNOSIS — M810 Age-related osteoporosis without current pathological fracture: Secondary | ICD-10-CM | POA: Diagnosis not present

## 2018-01-08 DIAGNOSIS — I10 Essential (primary) hypertension: Secondary | ICD-10-CM | POA: Diagnosis not present

## 2018-01-08 DIAGNOSIS — F419 Anxiety disorder, unspecified: Secondary | ICD-10-CM | POA: Diagnosis not present

## 2018-01-08 DIAGNOSIS — R412 Retrograde amnesia: Secondary | ICD-10-CM | POA: Diagnosis not present

## 2018-01-09 DIAGNOSIS — R412 Retrograde amnesia: Secondary | ICD-10-CM | POA: Diagnosis not present

## 2018-01-09 DIAGNOSIS — I10 Essential (primary) hypertension: Secondary | ICD-10-CM | POA: Diagnosis not present

## 2018-01-09 DIAGNOSIS — M810 Age-related osteoporosis without current pathological fracture: Secondary | ICD-10-CM | POA: Diagnosis not present

## 2018-01-09 DIAGNOSIS — F419 Anxiety disorder, unspecified: Secondary | ICD-10-CM | POA: Diagnosis not present

## 2018-01-10 DIAGNOSIS — M810 Age-related osteoporosis without current pathological fracture: Secondary | ICD-10-CM | POA: Diagnosis not present

## 2018-01-10 DIAGNOSIS — F419 Anxiety disorder, unspecified: Secondary | ICD-10-CM | POA: Diagnosis not present

## 2018-01-10 DIAGNOSIS — I10 Essential (primary) hypertension: Secondary | ICD-10-CM | POA: Diagnosis not present

## 2018-01-10 DIAGNOSIS — R412 Retrograde amnesia: Secondary | ICD-10-CM | POA: Diagnosis not present

## 2018-01-11 DIAGNOSIS — M810 Age-related osteoporosis without current pathological fracture: Secondary | ICD-10-CM | POA: Diagnosis not present

## 2018-01-11 DIAGNOSIS — R412 Retrograde amnesia: Secondary | ICD-10-CM | POA: Diagnosis not present

## 2018-01-11 DIAGNOSIS — I10 Essential (primary) hypertension: Secondary | ICD-10-CM | POA: Diagnosis not present

## 2018-01-11 DIAGNOSIS — F419 Anxiety disorder, unspecified: Secondary | ICD-10-CM | POA: Diagnosis not present

## 2018-01-12 DIAGNOSIS — M810 Age-related osteoporosis without current pathological fracture: Secondary | ICD-10-CM | POA: Diagnosis not present

## 2018-01-12 DIAGNOSIS — R412 Retrograde amnesia: Secondary | ICD-10-CM | POA: Diagnosis not present

## 2018-01-12 DIAGNOSIS — F419 Anxiety disorder, unspecified: Secondary | ICD-10-CM | POA: Diagnosis not present

## 2018-01-12 DIAGNOSIS — I10 Essential (primary) hypertension: Secondary | ICD-10-CM | POA: Diagnosis not present

## 2018-01-13 DIAGNOSIS — M810 Age-related osteoporosis without current pathological fracture: Secondary | ICD-10-CM | POA: Diagnosis not present

## 2018-01-13 DIAGNOSIS — F419 Anxiety disorder, unspecified: Secondary | ICD-10-CM | POA: Diagnosis not present

## 2018-01-13 DIAGNOSIS — I10 Essential (primary) hypertension: Secondary | ICD-10-CM | POA: Diagnosis not present

## 2018-01-13 DIAGNOSIS — R412 Retrograde amnesia: Secondary | ICD-10-CM | POA: Diagnosis not present

## 2018-01-14 DIAGNOSIS — Z0000 Encounter for general adult medical examination without abnormal findings: Secondary | ICD-10-CM | POA: Diagnosis not present

## 2018-01-14 DIAGNOSIS — Z6824 Body mass index (BMI) 24.0-24.9, adult: Secondary | ICD-10-CM | POA: Diagnosis not present

## 2018-01-14 DIAGNOSIS — R1314 Dysphagia, pharyngoesophageal phase: Secondary | ICD-10-CM | POA: Diagnosis not present

## 2018-01-14 DIAGNOSIS — K210 Gastro-esophageal reflux disease with esophagitis: Secondary | ICD-10-CM | POA: Diagnosis not present

## 2018-01-14 DIAGNOSIS — J3089 Other allergic rhinitis: Secondary | ICD-10-CM | POA: Diagnosis not present

## 2018-01-14 DIAGNOSIS — Z6823 Body mass index (BMI) 23.0-23.9, adult: Secondary | ICD-10-CM | POA: Diagnosis not present

## 2018-01-14 DIAGNOSIS — M810 Age-related osteoporosis without current pathological fracture: Secondary | ICD-10-CM | POA: Diagnosis not present

## 2018-01-14 DIAGNOSIS — Z1389 Encounter for screening for other disorder: Secondary | ICD-10-CM | POA: Diagnosis not present

## 2018-01-14 DIAGNOSIS — Z79899 Other long term (current) drug therapy: Secondary | ICD-10-CM | POA: Diagnosis not present

## 2018-01-14 DIAGNOSIS — F419 Anxiety disorder, unspecified: Secondary | ICD-10-CM | POA: Diagnosis not present

## 2018-01-14 DIAGNOSIS — I10 Essential (primary) hypertension: Secondary | ICD-10-CM | POA: Diagnosis not present

## 2018-01-14 DIAGNOSIS — R412 Retrograde amnesia: Secondary | ICD-10-CM | POA: Diagnosis not present

## 2018-01-15 DIAGNOSIS — R412 Retrograde amnesia: Secondary | ICD-10-CM | POA: Diagnosis not present

## 2018-01-15 DIAGNOSIS — I10 Essential (primary) hypertension: Secondary | ICD-10-CM | POA: Diagnosis not present

## 2018-01-15 DIAGNOSIS — M810 Age-related osteoporosis without current pathological fracture: Secondary | ICD-10-CM | POA: Diagnosis not present

## 2018-01-15 DIAGNOSIS — F419 Anxiety disorder, unspecified: Secondary | ICD-10-CM | POA: Diagnosis not present

## 2018-01-16 DIAGNOSIS — F419 Anxiety disorder, unspecified: Secondary | ICD-10-CM | POA: Diagnosis not present

## 2018-01-16 DIAGNOSIS — M810 Age-related osteoporosis without current pathological fracture: Secondary | ICD-10-CM | POA: Diagnosis not present

## 2018-01-16 DIAGNOSIS — R412 Retrograde amnesia: Secondary | ICD-10-CM | POA: Diagnosis not present

## 2018-01-16 DIAGNOSIS — I10 Essential (primary) hypertension: Secondary | ICD-10-CM | POA: Diagnosis not present

## 2018-01-17 DIAGNOSIS — F419 Anxiety disorder, unspecified: Secondary | ICD-10-CM | POA: Diagnosis not present

## 2018-01-17 DIAGNOSIS — R412 Retrograde amnesia: Secondary | ICD-10-CM | POA: Diagnosis not present

## 2018-01-17 DIAGNOSIS — I10 Essential (primary) hypertension: Secondary | ICD-10-CM | POA: Diagnosis not present

## 2018-01-17 DIAGNOSIS — M810 Age-related osteoporosis without current pathological fracture: Secondary | ICD-10-CM | POA: Diagnosis not present

## 2018-01-18 DIAGNOSIS — M810 Age-related osteoporosis without current pathological fracture: Secondary | ICD-10-CM | POA: Diagnosis not present

## 2018-01-18 DIAGNOSIS — I10 Essential (primary) hypertension: Secondary | ICD-10-CM | POA: Diagnosis not present

## 2018-01-18 DIAGNOSIS — R412 Retrograde amnesia: Secondary | ICD-10-CM | POA: Diagnosis not present

## 2018-01-18 DIAGNOSIS — F419 Anxiety disorder, unspecified: Secondary | ICD-10-CM | POA: Diagnosis not present

## 2018-01-19 DIAGNOSIS — R412 Retrograde amnesia: Secondary | ICD-10-CM | POA: Diagnosis not present

## 2018-01-19 DIAGNOSIS — I10 Essential (primary) hypertension: Secondary | ICD-10-CM | POA: Diagnosis not present

## 2018-01-19 DIAGNOSIS — M810 Age-related osteoporosis without current pathological fracture: Secondary | ICD-10-CM | POA: Diagnosis not present

## 2018-01-19 DIAGNOSIS — F419 Anxiety disorder, unspecified: Secondary | ICD-10-CM | POA: Diagnosis not present

## 2018-01-20 DIAGNOSIS — I10 Essential (primary) hypertension: Secondary | ICD-10-CM | POA: Diagnosis not present

## 2018-01-20 DIAGNOSIS — R412 Retrograde amnesia: Secondary | ICD-10-CM | POA: Diagnosis not present

## 2018-01-20 DIAGNOSIS — M810 Age-related osteoporosis without current pathological fracture: Secondary | ICD-10-CM | POA: Diagnosis not present

## 2018-01-20 DIAGNOSIS — F419 Anxiety disorder, unspecified: Secondary | ICD-10-CM | POA: Diagnosis not present

## 2018-01-21 DIAGNOSIS — I10 Essential (primary) hypertension: Secondary | ICD-10-CM | POA: Diagnosis not present

## 2018-01-21 DIAGNOSIS — F419 Anxiety disorder, unspecified: Secondary | ICD-10-CM | POA: Diagnosis not present

## 2018-01-21 DIAGNOSIS — M810 Age-related osteoporosis without current pathological fracture: Secondary | ICD-10-CM | POA: Diagnosis not present

## 2018-01-21 DIAGNOSIS — R412 Retrograde amnesia: Secondary | ICD-10-CM | POA: Diagnosis not present

## 2018-01-22 DIAGNOSIS — M810 Age-related osteoporosis without current pathological fracture: Secondary | ICD-10-CM | POA: Diagnosis not present

## 2018-01-22 DIAGNOSIS — F419 Anxiety disorder, unspecified: Secondary | ICD-10-CM | POA: Diagnosis not present

## 2018-01-22 DIAGNOSIS — I10 Essential (primary) hypertension: Secondary | ICD-10-CM | POA: Diagnosis not present

## 2018-01-22 DIAGNOSIS — R412 Retrograde amnesia: Secondary | ICD-10-CM | POA: Diagnosis not present

## 2018-01-23 DIAGNOSIS — M810 Age-related osteoporosis without current pathological fracture: Secondary | ICD-10-CM | POA: Diagnosis not present

## 2018-01-23 DIAGNOSIS — R412 Retrograde amnesia: Secondary | ICD-10-CM | POA: Diagnosis not present

## 2018-01-23 DIAGNOSIS — I10 Essential (primary) hypertension: Secondary | ICD-10-CM | POA: Diagnosis not present

## 2018-01-23 DIAGNOSIS — F419 Anxiety disorder, unspecified: Secondary | ICD-10-CM | POA: Diagnosis not present

## 2018-01-24 DIAGNOSIS — M810 Age-related osteoporosis without current pathological fracture: Secondary | ICD-10-CM | POA: Diagnosis not present

## 2018-01-24 DIAGNOSIS — F419 Anxiety disorder, unspecified: Secondary | ICD-10-CM | POA: Diagnosis not present

## 2018-01-24 DIAGNOSIS — I10 Essential (primary) hypertension: Secondary | ICD-10-CM | POA: Diagnosis not present

## 2018-01-24 DIAGNOSIS — R412 Retrograde amnesia: Secondary | ICD-10-CM | POA: Diagnosis not present

## 2018-01-25 DIAGNOSIS — M810 Age-related osteoporosis without current pathological fracture: Secondary | ICD-10-CM | POA: Diagnosis not present

## 2018-01-25 DIAGNOSIS — R412 Retrograde amnesia: Secondary | ICD-10-CM | POA: Diagnosis not present

## 2018-01-25 DIAGNOSIS — I10 Essential (primary) hypertension: Secondary | ICD-10-CM | POA: Diagnosis not present

## 2018-01-25 DIAGNOSIS — F419 Anxiety disorder, unspecified: Secondary | ICD-10-CM | POA: Diagnosis not present

## 2018-01-26 DIAGNOSIS — F419 Anxiety disorder, unspecified: Secondary | ICD-10-CM | POA: Diagnosis not present

## 2018-01-26 DIAGNOSIS — R412 Retrograde amnesia: Secondary | ICD-10-CM | POA: Diagnosis not present

## 2018-01-26 DIAGNOSIS — M810 Age-related osteoporosis without current pathological fracture: Secondary | ICD-10-CM | POA: Diagnosis not present

## 2018-01-26 DIAGNOSIS — I10 Essential (primary) hypertension: Secondary | ICD-10-CM | POA: Diagnosis not present

## 2018-01-27 DIAGNOSIS — M810 Age-related osteoporosis without current pathological fracture: Secondary | ICD-10-CM | POA: Diagnosis not present

## 2018-01-27 DIAGNOSIS — I10 Essential (primary) hypertension: Secondary | ICD-10-CM | POA: Diagnosis not present

## 2018-01-27 DIAGNOSIS — R412 Retrograde amnesia: Secondary | ICD-10-CM | POA: Diagnosis not present

## 2018-01-27 DIAGNOSIS — F419 Anxiety disorder, unspecified: Secondary | ICD-10-CM | POA: Diagnosis not present

## 2018-01-28 DIAGNOSIS — F419 Anxiety disorder, unspecified: Secondary | ICD-10-CM | POA: Diagnosis not present

## 2018-01-28 DIAGNOSIS — M810 Age-related osteoporosis without current pathological fracture: Secondary | ICD-10-CM | POA: Diagnosis not present

## 2018-01-28 DIAGNOSIS — R412 Retrograde amnesia: Secondary | ICD-10-CM | POA: Diagnosis not present

## 2018-01-28 DIAGNOSIS — I10 Essential (primary) hypertension: Secondary | ICD-10-CM | POA: Diagnosis not present

## 2018-01-29 DIAGNOSIS — R412 Retrograde amnesia: Secondary | ICD-10-CM | POA: Diagnosis not present

## 2018-01-29 DIAGNOSIS — F419 Anxiety disorder, unspecified: Secondary | ICD-10-CM | POA: Diagnosis not present

## 2018-01-29 DIAGNOSIS — M810 Age-related osteoporosis without current pathological fracture: Secondary | ICD-10-CM | POA: Diagnosis not present

## 2018-01-29 DIAGNOSIS — I10 Essential (primary) hypertension: Secondary | ICD-10-CM | POA: Diagnosis not present

## 2018-01-30 DIAGNOSIS — M810 Age-related osteoporosis without current pathological fracture: Secondary | ICD-10-CM | POA: Diagnosis not present

## 2018-01-30 DIAGNOSIS — I10 Essential (primary) hypertension: Secondary | ICD-10-CM | POA: Diagnosis not present

## 2018-01-30 DIAGNOSIS — R412 Retrograde amnesia: Secondary | ICD-10-CM | POA: Diagnosis not present

## 2018-01-30 DIAGNOSIS — F419 Anxiety disorder, unspecified: Secondary | ICD-10-CM | POA: Diagnosis not present

## 2018-01-31 DIAGNOSIS — I10 Essential (primary) hypertension: Secondary | ICD-10-CM | POA: Diagnosis not present

## 2018-01-31 DIAGNOSIS — F419 Anxiety disorder, unspecified: Secondary | ICD-10-CM | POA: Diagnosis not present

## 2018-01-31 DIAGNOSIS — R412 Retrograde amnesia: Secondary | ICD-10-CM | POA: Diagnosis not present

## 2018-01-31 DIAGNOSIS — M810 Age-related osteoporosis without current pathological fracture: Secondary | ICD-10-CM | POA: Diagnosis not present

## 2018-02-01 DIAGNOSIS — M810 Age-related osteoporosis without current pathological fracture: Secondary | ICD-10-CM | POA: Diagnosis not present

## 2018-02-01 DIAGNOSIS — F419 Anxiety disorder, unspecified: Secondary | ICD-10-CM | POA: Diagnosis not present

## 2018-02-01 DIAGNOSIS — R412 Retrograde amnesia: Secondary | ICD-10-CM | POA: Diagnosis not present

## 2018-02-01 DIAGNOSIS — I10 Essential (primary) hypertension: Secondary | ICD-10-CM | POA: Diagnosis not present

## 2018-02-02 DIAGNOSIS — R412 Retrograde amnesia: Secondary | ICD-10-CM | POA: Diagnosis not present

## 2018-02-02 DIAGNOSIS — F419 Anxiety disorder, unspecified: Secondary | ICD-10-CM | POA: Diagnosis not present

## 2018-02-02 DIAGNOSIS — M810 Age-related osteoporosis without current pathological fracture: Secondary | ICD-10-CM | POA: Diagnosis not present

## 2018-02-02 DIAGNOSIS — I10 Essential (primary) hypertension: Secondary | ICD-10-CM | POA: Diagnosis not present

## 2018-02-03 DIAGNOSIS — I10 Essential (primary) hypertension: Secondary | ICD-10-CM | POA: Diagnosis not present

## 2018-02-03 DIAGNOSIS — R412 Retrograde amnesia: Secondary | ICD-10-CM | POA: Diagnosis not present

## 2018-02-03 DIAGNOSIS — F419 Anxiety disorder, unspecified: Secondary | ICD-10-CM | POA: Diagnosis not present

## 2018-02-03 DIAGNOSIS — M810 Age-related osteoporosis without current pathological fracture: Secondary | ICD-10-CM | POA: Diagnosis not present

## 2018-02-04 DIAGNOSIS — I10 Essential (primary) hypertension: Secondary | ICD-10-CM | POA: Diagnosis not present

## 2018-02-04 DIAGNOSIS — F419 Anxiety disorder, unspecified: Secondary | ICD-10-CM | POA: Diagnosis not present

## 2018-02-04 DIAGNOSIS — M810 Age-related osteoporosis without current pathological fracture: Secondary | ICD-10-CM | POA: Diagnosis not present

## 2018-02-04 DIAGNOSIS — R412 Retrograde amnesia: Secondary | ICD-10-CM | POA: Diagnosis not present

## 2018-02-05 DIAGNOSIS — R412 Retrograde amnesia: Secondary | ICD-10-CM | POA: Diagnosis not present

## 2018-02-05 DIAGNOSIS — I10 Essential (primary) hypertension: Secondary | ICD-10-CM | POA: Diagnosis not present

## 2018-02-05 DIAGNOSIS — F419 Anxiety disorder, unspecified: Secondary | ICD-10-CM | POA: Diagnosis not present

## 2018-02-05 DIAGNOSIS — M810 Age-related osteoporosis without current pathological fracture: Secondary | ICD-10-CM | POA: Diagnosis not present

## 2018-02-06 DIAGNOSIS — R412 Retrograde amnesia: Secondary | ICD-10-CM | POA: Diagnosis not present

## 2018-02-06 DIAGNOSIS — I10 Essential (primary) hypertension: Secondary | ICD-10-CM | POA: Diagnosis not present

## 2018-02-06 DIAGNOSIS — M810 Age-related osteoporosis without current pathological fracture: Secondary | ICD-10-CM | POA: Diagnosis not present

## 2018-02-06 DIAGNOSIS — F419 Anxiety disorder, unspecified: Secondary | ICD-10-CM | POA: Diagnosis not present

## 2018-02-07 DIAGNOSIS — I10 Essential (primary) hypertension: Secondary | ICD-10-CM | POA: Diagnosis not present

## 2018-02-07 DIAGNOSIS — R412 Retrograde amnesia: Secondary | ICD-10-CM | POA: Diagnosis not present

## 2018-02-07 DIAGNOSIS — F419 Anxiety disorder, unspecified: Secondary | ICD-10-CM | POA: Diagnosis not present

## 2018-02-07 DIAGNOSIS — M810 Age-related osteoporosis without current pathological fracture: Secondary | ICD-10-CM | POA: Diagnosis not present

## 2018-02-08 DIAGNOSIS — M810 Age-related osteoporosis without current pathological fracture: Secondary | ICD-10-CM | POA: Diagnosis not present

## 2018-02-08 DIAGNOSIS — F419 Anxiety disorder, unspecified: Secondary | ICD-10-CM | POA: Diagnosis not present

## 2018-02-08 DIAGNOSIS — I10 Essential (primary) hypertension: Secondary | ICD-10-CM | POA: Diagnosis not present

## 2018-02-08 DIAGNOSIS — R412 Retrograde amnesia: Secondary | ICD-10-CM | POA: Diagnosis not present

## 2018-02-09 DIAGNOSIS — F419 Anxiety disorder, unspecified: Secondary | ICD-10-CM | POA: Diagnosis not present

## 2018-02-09 DIAGNOSIS — R412 Retrograde amnesia: Secondary | ICD-10-CM | POA: Diagnosis not present

## 2018-02-09 DIAGNOSIS — M810 Age-related osteoporosis without current pathological fracture: Secondary | ICD-10-CM | POA: Diagnosis not present

## 2018-02-09 DIAGNOSIS — I10 Essential (primary) hypertension: Secondary | ICD-10-CM | POA: Diagnosis not present

## 2018-02-10 DIAGNOSIS — M810 Age-related osteoporosis without current pathological fracture: Secondary | ICD-10-CM | POA: Diagnosis not present

## 2018-02-10 DIAGNOSIS — I10 Essential (primary) hypertension: Secondary | ICD-10-CM | POA: Diagnosis not present

## 2018-02-10 DIAGNOSIS — R412 Retrograde amnesia: Secondary | ICD-10-CM | POA: Diagnosis not present

## 2018-02-10 DIAGNOSIS — F419 Anxiety disorder, unspecified: Secondary | ICD-10-CM | POA: Diagnosis not present

## 2018-02-11 DIAGNOSIS — F419 Anxiety disorder, unspecified: Secondary | ICD-10-CM | POA: Diagnosis not present

## 2018-02-11 DIAGNOSIS — I10 Essential (primary) hypertension: Secondary | ICD-10-CM | POA: Diagnosis not present

## 2018-02-11 DIAGNOSIS — R412 Retrograde amnesia: Secondary | ICD-10-CM | POA: Diagnosis not present

## 2018-02-11 DIAGNOSIS — M810 Age-related osteoporosis without current pathological fracture: Secondary | ICD-10-CM | POA: Diagnosis not present

## 2018-02-12 DIAGNOSIS — R412 Retrograde amnesia: Secondary | ICD-10-CM | POA: Diagnosis not present

## 2018-02-12 DIAGNOSIS — F419 Anxiety disorder, unspecified: Secondary | ICD-10-CM | POA: Diagnosis not present

## 2018-02-12 DIAGNOSIS — M810 Age-related osteoporosis without current pathological fracture: Secondary | ICD-10-CM | POA: Diagnosis not present

## 2018-02-12 DIAGNOSIS — I10 Essential (primary) hypertension: Secondary | ICD-10-CM | POA: Diagnosis not present

## 2018-02-13 DIAGNOSIS — M810 Age-related osteoporosis without current pathological fracture: Secondary | ICD-10-CM | POA: Diagnosis not present

## 2018-02-13 DIAGNOSIS — F419 Anxiety disorder, unspecified: Secondary | ICD-10-CM | POA: Diagnosis not present

## 2018-02-13 DIAGNOSIS — R412 Retrograde amnesia: Secondary | ICD-10-CM | POA: Diagnosis not present

## 2018-02-13 DIAGNOSIS — I10 Essential (primary) hypertension: Secondary | ICD-10-CM | POA: Diagnosis not present

## 2018-02-14 DIAGNOSIS — M810 Age-related osteoporosis without current pathological fracture: Secondary | ICD-10-CM | POA: Diagnosis not present

## 2018-02-14 DIAGNOSIS — I10 Essential (primary) hypertension: Secondary | ICD-10-CM | POA: Diagnosis not present

## 2018-02-14 DIAGNOSIS — R412 Retrograde amnesia: Secondary | ICD-10-CM | POA: Diagnosis not present

## 2018-02-14 DIAGNOSIS — F419 Anxiety disorder, unspecified: Secondary | ICD-10-CM | POA: Diagnosis not present

## 2018-02-15 DIAGNOSIS — I10 Essential (primary) hypertension: Secondary | ICD-10-CM | POA: Diagnosis not present

## 2018-02-15 DIAGNOSIS — R412 Retrograde amnesia: Secondary | ICD-10-CM | POA: Diagnosis not present

## 2018-02-15 DIAGNOSIS — M810 Age-related osteoporosis without current pathological fracture: Secondary | ICD-10-CM | POA: Diagnosis not present

## 2018-02-15 DIAGNOSIS — F419 Anxiety disorder, unspecified: Secondary | ICD-10-CM | POA: Diagnosis not present

## 2018-02-16 DIAGNOSIS — F419 Anxiety disorder, unspecified: Secondary | ICD-10-CM | POA: Diagnosis not present

## 2018-02-16 DIAGNOSIS — R412 Retrograde amnesia: Secondary | ICD-10-CM | POA: Diagnosis not present

## 2018-02-16 DIAGNOSIS — I10 Essential (primary) hypertension: Secondary | ICD-10-CM | POA: Diagnosis not present

## 2018-02-16 DIAGNOSIS — M810 Age-related osteoporosis without current pathological fracture: Secondary | ICD-10-CM | POA: Diagnosis not present

## 2018-02-17 DIAGNOSIS — M810 Age-related osteoporosis without current pathological fracture: Secondary | ICD-10-CM | POA: Diagnosis not present

## 2018-02-17 DIAGNOSIS — R412 Retrograde amnesia: Secondary | ICD-10-CM | POA: Diagnosis not present

## 2018-02-17 DIAGNOSIS — F419 Anxiety disorder, unspecified: Secondary | ICD-10-CM | POA: Diagnosis not present

## 2018-02-17 DIAGNOSIS — I10 Essential (primary) hypertension: Secondary | ICD-10-CM | POA: Diagnosis not present

## 2018-02-18 DIAGNOSIS — F419 Anxiety disorder, unspecified: Secondary | ICD-10-CM | POA: Diagnosis not present

## 2018-02-18 DIAGNOSIS — M810 Age-related osteoporosis without current pathological fracture: Secondary | ICD-10-CM | POA: Diagnosis not present

## 2018-02-18 DIAGNOSIS — R412 Retrograde amnesia: Secondary | ICD-10-CM | POA: Diagnosis not present

## 2018-02-18 DIAGNOSIS — I10 Essential (primary) hypertension: Secondary | ICD-10-CM | POA: Diagnosis not present

## 2018-02-19 DIAGNOSIS — I10 Essential (primary) hypertension: Secondary | ICD-10-CM | POA: Diagnosis not present

## 2018-02-19 DIAGNOSIS — M810 Age-related osteoporosis without current pathological fracture: Secondary | ICD-10-CM | POA: Diagnosis not present

## 2018-02-19 DIAGNOSIS — R412 Retrograde amnesia: Secondary | ICD-10-CM | POA: Diagnosis not present

## 2018-02-19 DIAGNOSIS — F419 Anxiety disorder, unspecified: Secondary | ICD-10-CM | POA: Diagnosis not present

## 2018-02-20 DIAGNOSIS — F419 Anxiety disorder, unspecified: Secondary | ICD-10-CM | POA: Diagnosis not present

## 2018-02-20 DIAGNOSIS — I10 Essential (primary) hypertension: Secondary | ICD-10-CM | POA: Diagnosis not present

## 2018-02-20 DIAGNOSIS — M810 Age-related osteoporosis without current pathological fracture: Secondary | ICD-10-CM | POA: Diagnosis not present

## 2018-02-20 DIAGNOSIS — R412 Retrograde amnesia: Secondary | ICD-10-CM | POA: Diagnosis not present

## 2018-02-21 DIAGNOSIS — F419 Anxiety disorder, unspecified: Secondary | ICD-10-CM | POA: Diagnosis not present

## 2018-02-21 DIAGNOSIS — R412 Retrograde amnesia: Secondary | ICD-10-CM | POA: Diagnosis not present

## 2018-02-21 DIAGNOSIS — M810 Age-related osteoporosis without current pathological fracture: Secondary | ICD-10-CM | POA: Diagnosis not present

## 2018-02-21 DIAGNOSIS — I10 Essential (primary) hypertension: Secondary | ICD-10-CM | POA: Diagnosis not present

## 2018-02-22 DIAGNOSIS — I10 Essential (primary) hypertension: Secondary | ICD-10-CM | POA: Diagnosis not present

## 2018-02-22 DIAGNOSIS — F419 Anxiety disorder, unspecified: Secondary | ICD-10-CM | POA: Diagnosis not present

## 2018-02-22 DIAGNOSIS — M810 Age-related osteoporosis without current pathological fracture: Secondary | ICD-10-CM | POA: Diagnosis not present

## 2018-02-22 DIAGNOSIS — R412 Retrograde amnesia: Secondary | ICD-10-CM | POA: Diagnosis not present

## 2018-02-23 DIAGNOSIS — F419 Anxiety disorder, unspecified: Secondary | ICD-10-CM | POA: Diagnosis not present

## 2018-02-23 DIAGNOSIS — M810 Age-related osteoporosis without current pathological fracture: Secondary | ICD-10-CM | POA: Diagnosis not present

## 2018-02-23 DIAGNOSIS — I10 Essential (primary) hypertension: Secondary | ICD-10-CM | POA: Diagnosis not present

## 2018-02-23 DIAGNOSIS — R412 Retrograde amnesia: Secondary | ICD-10-CM | POA: Diagnosis not present

## 2018-02-24 DIAGNOSIS — F419 Anxiety disorder, unspecified: Secondary | ICD-10-CM | POA: Diagnosis not present

## 2018-02-24 DIAGNOSIS — R412 Retrograde amnesia: Secondary | ICD-10-CM | POA: Diagnosis not present

## 2018-02-24 DIAGNOSIS — M810 Age-related osteoporosis without current pathological fracture: Secondary | ICD-10-CM | POA: Diagnosis not present

## 2018-02-24 DIAGNOSIS — I10 Essential (primary) hypertension: Secondary | ICD-10-CM | POA: Diagnosis not present

## 2018-02-25 DIAGNOSIS — M810 Age-related osteoporosis without current pathological fracture: Secondary | ICD-10-CM | POA: Diagnosis not present

## 2018-02-25 DIAGNOSIS — F419 Anxiety disorder, unspecified: Secondary | ICD-10-CM | POA: Diagnosis not present

## 2018-02-25 DIAGNOSIS — R412 Retrograde amnesia: Secondary | ICD-10-CM | POA: Diagnosis not present

## 2018-02-25 DIAGNOSIS — I10 Essential (primary) hypertension: Secondary | ICD-10-CM | POA: Diagnosis not present

## 2018-02-26 DIAGNOSIS — R412 Retrograde amnesia: Secondary | ICD-10-CM | POA: Diagnosis not present

## 2018-02-26 DIAGNOSIS — I10 Essential (primary) hypertension: Secondary | ICD-10-CM | POA: Diagnosis not present

## 2018-02-26 DIAGNOSIS — M810 Age-related osteoporosis without current pathological fracture: Secondary | ICD-10-CM | POA: Diagnosis not present

## 2018-02-26 DIAGNOSIS — F419 Anxiety disorder, unspecified: Secondary | ICD-10-CM | POA: Diagnosis not present

## 2018-02-27 DIAGNOSIS — R412 Retrograde amnesia: Secondary | ICD-10-CM | POA: Diagnosis not present

## 2018-02-27 DIAGNOSIS — M810 Age-related osteoporosis without current pathological fracture: Secondary | ICD-10-CM | POA: Diagnosis not present

## 2018-02-27 DIAGNOSIS — I10 Essential (primary) hypertension: Secondary | ICD-10-CM | POA: Diagnosis not present

## 2018-02-27 DIAGNOSIS — F419 Anxiety disorder, unspecified: Secondary | ICD-10-CM | POA: Diagnosis not present

## 2018-02-28 DIAGNOSIS — I10 Essential (primary) hypertension: Secondary | ICD-10-CM | POA: Diagnosis not present

## 2018-02-28 DIAGNOSIS — M810 Age-related osteoporosis without current pathological fracture: Secondary | ICD-10-CM | POA: Diagnosis not present

## 2018-02-28 DIAGNOSIS — F419 Anxiety disorder, unspecified: Secondary | ICD-10-CM | POA: Diagnosis not present

## 2018-02-28 DIAGNOSIS — R412 Retrograde amnesia: Secondary | ICD-10-CM | POA: Diagnosis not present

## 2018-03-01 DIAGNOSIS — M810 Age-related osteoporosis without current pathological fracture: Secondary | ICD-10-CM | POA: Diagnosis not present

## 2018-03-01 DIAGNOSIS — F419 Anxiety disorder, unspecified: Secondary | ICD-10-CM | POA: Diagnosis not present

## 2018-03-01 DIAGNOSIS — R412 Retrograde amnesia: Secondary | ICD-10-CM | POA: Diagnosis not present

## 2018-03-01 DIAGNOSIS — I10 Essential (primary) hypertension: Secondary | ICD-10-CM | POA: Diagnosis not present

## 2018-03-02 DIAGNOSIS — F419 Anxiety disorder, unspecified: Secondary | ICD-10-CM | POA: Diagnosis not present

## 2018-03-02 DIAGNOSIS — I10 Essential (primary) hypertension: Secondary | ICD-10-CM | POA: Diagnosis not present

## 2018-03-02 DIAGNOSIS — M810 Age-related osteoporosis without current pathological fracture: Secondary | ICD-10-CM | POA: Diagnosis not present

## 2018-03-02 DIAGNOSIS — R412 Retrograde amnesia: Secondary | ICD-10-CM | POA: Diagnosis not present

## 2018-03-03 DIAGNOSIS — F419 Anxiety disorder, unspecified: Secondary | ICD-10-CM | POA: Diagnosis not present

## 2018-03-03 DIAGNOSIS — I10 Essential (primary) hypertension: Secondary | ICD-10-CM | POA: Diagnosis not present

## 2018-03-03 DIAGNOSIS — M810 Age-related osteoporosis without current pathological fracture: Secondary | ICD-10-CM | POA: Diagnosis not present

## 2018-03-03 DIAGNOSIS — R412 Retrograde amnesia: Secondary | ICD-10-CM | POA: Diagnosis not present

## 2018-03-04 DIAGNOSIS — F419 Anxiety disorder, unspecified: Secondary | ICD-10-CM | POA: Diagnosis not present

## 2018-03-04 DIAGNOSIS — R412 Retrograde amnesia: Secondary | ICD-10-CM | POA: Diagnosis not present

## 2018-03-04 DIAGNOSIS — M810 Age-related osteoporosis without current pathological fracture: Secondary | ICD-10-CM | POA: Diagnosis not present

## 2018-03-04 DIAGNOSIS — I10 Essential (primary) hypertension: Secondary | ICD-10-CM | POA: Diagnosis not present

## 2018-03-05 DIAGNOSIS — M810 Age-related osteoporosis without current pathological fracture: Secondary | ICD-10-CM | POA: Diagnosis not present

## 2018-03-05 DIAGNOSIS — R412 Retrograde amnesia: Secondary | ICD-10-CM | POA: Diagnosis not present

## 2018-03-05 DIAGNOSIS — I10 Essential (primary) hypertension: Secondary | ICD-10-CM | POA: Diagnosis not present

## 2018-03-05 DIAGNOSIS — F419 Anxiety disorder, unspecified: Secondary | ICD-10-CM | POA: Diagnosis not present

## 2018-03-06 DIAGNOSIS — R412 Retrograde amnesia: Secondary | ICD-10-CM | POA: Diagnosis not present

## 2018-03-06 DIAGNOSIS — M810 Age-related osteoporosis without current pathological fracture: Secondary | ICD-10-CM | POA: Diagnosis not present

## 2018-03-06 DIAGNOSIS — I10 Essential (primary) hypertension: Secondary | ICD-10-CM | POA: Diagnosis not present

## 2018-03-06 DIAGNOSIS — F419 Anxiety disorder, unspecified: Secondary | ICD-10-CM | POA: Diagnosis not present

## 2018-03-07 DIAGNOSIS — M810 Age-related osteoporosis without current pathological fracture: Secondary | ICD-10-CM | POA: Diagnosis not present

## 2018-03-07 DIAGNOSIS — F419 Anxiety disorder, unspecified: Secondary | ICD-10-CM | POA: Diagnosis not present

## 2018-03-07 DIAGNOSIS — R412 Retrograde amnesia: Secondary | ICD-10-CM | POA: Diagnosis not present

## 2018-03-07 DIAGNOSIS — I10 Essential (primary) hypertension: Secondary | ICD-10-CM | POA: Diagnosis not present

## 2018-03-08 DIAGNOSIS — R412 Retrograde amnesia: Secondary | ICD-10-CM | POA: Diagnosis not present

## 2018-03-08 DIAGNOSIS — M810 Age-related osteoporosis without current pathological fracture: Secondary | ICD-10-CM | POA: Diagnosis not present

## 2018-03-08 DIAGNOSIS — I10 Essential (primary) hypertension: Secondary | ICD-10-CM | POA: Diagnosis not present

## 2018-03-08 DIAGNOSIS — F419 Anxiety disorder, unspecified: Secondary | ICD-10-CM | POA: Diagnosis not present

## 2018-03-09 DIAGNOSIS — R412 Retrograde amnesia: Secondary | ICD-10-CM | POA: Diagnosis not present

## 2018-03-09 DIAGNOSIS — I10 Essential (primary) hypertension: Secondary | ICD-10-CM | POA: Diagnosis not present

## 2018-03-09 DIAGNOSIS — M810 Age-related osteoporosis without current pathological fracture: Secondary | ICD-10-CM | POA: Diagnosis not present

## 2018-03-09 DIAGNOSIS — F419 Anxiety disorder, unspecified: Secondary | ICD-10-CM | POA: Diagnosis not present

## 2018-03-10 DIAGNOSIS — I10 Essential (primary) hypertension: Secondary | ICD-10-CM | POA: Diagnosis not present

## 2018-03-10 DIAGNOSIS — M810 Age-related osteoporosis without current pathological fracture: Secondary | ICD-10-CM | POA: Diagnosis not present

## 2018-03-10 DIAGNOSIS — F419 Anxiety disorder, unspecified: Secondary | ICD-10-CM | POA: Diagnosis not present

## 2018-03-10 DIAGNOSIS — R412 Retrograde amnesia: Secondary | ICD-10-CM | POA: Diagnosis not present

## 2018-03-11 DIAGNOSIS — R412 Retrograde amnesia: Secondary | ICD-10-CM | POA: Diagnosis not present

## 2018-03-11 DIAGNOSIS — M810 Age-related osteoporosis without current pathological fracture: Secondary | ICD-10-CM | POA: Diagnosis not present

## 2018-03-11 DIAGNOSIS — F419 Anxiety disorder, unspecified: Secondary | ICD-10-CM | POA: Diagnosis not present

## 2018-03-11 DIAGNOSIS — I10 Essential (primary) hypertension: Secondary | ICD-10-CM | POA: Diagnosis not present

## 2018-03-12 DIAGNOSIS — R412 Retrograde amnesia: Secondary | ICD-10-CM | POA: Diagnosis not present

## 2018-03-12 DIAGNOSIS — I10 Essential (primary) hypertension: Secondary | ICD-10-CM | POA: Diagnosis not present

## 2018-03-12 DIAGNOSIS — F419 Anxiety disorder, unspecified: Secondary | ICD-10-CM | POA: Diagnosis not present

## 2018-03-12 DIAGNOSIS — M810 Age-related osteoporosis without current pathological fracture: Secondary | ICD-10-CM | POA: Diagnosis not present

## 2018-03-13 DIAGNOSIS — F419 Anxiety disorder, unspecified: Secondary | ICD-10-CM | POA: Diagnosis not present

## 2018-03-13 DIAGNOSIS — R412 Retrograde amnesia: Secondary | ICD-10-CM | POA: Diagnosis not present

## 2018-03-13 DIAGNOSIS — I10 Essential (primary) hypertension: Secondary | ICD-10-CM | POA: Diagnosis not present

## 2018-03-13 DIAGNOSIS — M810 Age-related osteoporosis without current pathological fracture: Secondary | ICD-10-CM | POA: Diagnosis not present

## 2018-03-14 DIAGNOSIS — R412 Retrograde amnesia: Secondary | ICD-10-CM | POA: Diagnosis not present

## 2018-03-14 DIAGNOSIS — F419 Anxiety disorder, unspecified: Secondary | ICD-10-CM | POA: Diagnosis not present

## 2018-03-14 DIAGNOSIS — M810 Age-related osteoporosis without current pathological fracture: Secondary | ICD-10-CM | POA: Diagnosis not present

## 2018-03-14 DIAGNOSIS — I10 Essential (primary) hypertension: Secondary | ICD-10-CM | POA: Diagnosis not present

## 2018-03-15 DIAGNOSIS — I10 Essential (primary) hypertension: Secondary | ICD-10-CM | POA: Diagnosis not present

## 2018-03-15 DIAGNOSIS — M810 Age-related osteoporosis without current pathological fracture: Secondary | ICD-10-CM | POA: Diagnosis not present

## 2018-03-15 DIAGNOSIS — F419 Anxiety disorder, unspecified: Secondary | ICD-10-CM | POA: Diagnosis not present

## 2018-03-15 DIAGNOSIS — R412 Retrograde amnesia: Secondary | ICD-10-CM | POA: Diagnosis not present

## 2018-03-16 DIAGNOSIS — I10 Essential (primary) hypertension: Secondary | ICD-10-CM | POA: Diagnosis not present

## 2018-03-16 DIAGNOSIS — M810 Age-related osteoporosis without current pathological fracture: Secondary | ICD-10-CM | POA: Diagnosis not present

## 2018-03-16 DIAGNOSIS — F419 Anxiety disorder, unspecified: Secondary | ICD-10-CM | POA: Diagnosis not present

## 2018-03-16 DIAGNOSIS — R412 Retrograde amnesia: Secondary | ICD-10-CM | POA: Diagnosis not present

## 2018-03-17 DIAGNOSIS — I10 Essential (primary) hypertension: Secondary | ICD-10-CM | POA: Diagnosis not present

## 2018-03-17 DIAGNOSIS — F419 Anxiety disorder, unspecified: Secondary | ICD-10-CM | POA: Diagnosis not present

## 2018-03-17 DIAGNOSIS — M810 Age-related osteoporosis without current pathological fracture: Secondary | ICD-10-CM | POA: Diagnosis not present

## 2018-03-17 DIAGNOSIS — R412 Retrograde amnesia: Secondary | ICD-10-CM | POA: Diagnosis not present

## 2018-03-18 DIAGNOSIS — R412 Retrograde amnesia: Secondary | ICD-10-CM | POA: Diagnosis not present

## 2018-03-18 DIAGNOSIS — I10 Essential (primary) hypertension: Secondary | ICD-10-CM | POA: Diagnosis not present

## 2018-03-18 DIAGNOSIS — M810 Age-related osteoporosis without current pathological fracture: Secondary | ICD-10-CM | POA: Diagnosis not present

## 2018-03-18 DIAGNOSIS — F419 Anxiety disorder, unspecified: Secondary | ICD-10-CM | POA: Diagnosis not present

## 2018-03-19 DIAGNOSIS — F419 Anxiety disorder, unspecified: Secondary | ICD-10-CM | POA: Diagnosis not present

## 2018-03-19 DIAGNOSIS — I10 Essential (primary) hypertension: Secondary | ICD-10-CM | POA: Diagnosis not present

## 2018-03-19 DIAGNOSIS — M810 Age-related osteoporosis without current pathological fracture: Secondary | ICD-10-CM | POA: Diagnosis not present

## 2018-03-19 DIAGNOSIS — R412 Retrograde amnesia: Secondary | ICD-10-CM | POA: Diagnosis not present

## 2018-03-20 DIAGNOSIS — F419 Anxiety disorder, unspecified: Secondary | ICD-10-CM | POA: Diagnosis not present

## 2018-03-20 DIAGNOSIS — I10 Essential (primary) hypertension: Secondary | ICD-10-CM | POA: Diagnosis not present

## 2018-03-20 DIAGNOSIS — M810 Age-related osteoporosis without current pathological fracture: Secondary | ICD-10-CM | POA: Diagnosis not present

## 2018-03-20 DIAGNOSIS — R412 Retrograde amnesia: Secondary | ICD-10-CM | POA: Diagnosis not present

## 2018-03-21 DIAGNOSIS — I10 Essential (primary) hypertension: Secondary | ICD-10-CM | POA: Diagnosis not present

## 2018-03-21 DIAGNOSIS — F419 Anxiety disorder, unspecified: Secondary | ICD-10-CM | POA: Diagnosis not present

## 2018-03-21 DIAGNOSIS — M810 Age-related osteoporosis without current pathological fracture: Secondary | ICD-10-CM | POA: Diagnosis not present

## 2018-03-21 DIAGNOSIS — R412 Retrograde amnesia: Secondary | ICD-10-CM | POA: Diagnosis not present

## 2018-03-22 DIAGNOSIS — M810 Age-related osteoporosis without current pathological fracture: Secondary | ICD-10-CM | POA: Diagnosis not present

## 2018-03-22 DIAGNOSIS — F419 Anxiety disorder, unspecified: Secondary | ICD-10-CM | POA: Diagnosis not present

## 2018-03-22 DIAGNOSIS — R412 Retrograde amnesia: Secondary | ICD-10-CM | POA: Diagnosis not present

## 2018-03-22 DIAGNOSIS — I10 Essential (primary) hypertension: Secondary | ICD-10-CM | POA: Diagnosis not present

## 2018-03-23 DIAGNOSIS — R412 Retrograde amnesia: Secondary | ICD-10-CM | POA: Diagnosis not present

## 2018-03-23 DIAGNOSIS — F419 Anxiety disorder, unspecified: Secondary | ICD-10-CM | POA: Diagnosis not present

## 2018-03-23 DIAGNOSIS — M810 Age-related osteoporosis without current pathological fracture: Secondary | ICD-10-CM | POA: Diagnosis not present

## 2018-03-23 DIAGNOSIS — I10 Essential (primary) hypertension: Secondary | ICD-10-CM | POA: Diagnosis not present

## 2018-03-24 DIAGNOSIS — F419 Anxiety disorder, unspecified: Secondary | ICD-10-CM | POA: Diagnosis not present

## 2018-03-24 DIAGNOSIS — M810 Age-related osteoporosis without current pathological fracture: Secondary | ICD-10-CM | POA: Diagnosis not present

## 2018-03-24 DIAGNOSIS — I10 Essential (primary) hypertension: Secondary | ICD-10-CM | POA: Diagnosis not present

## 2018-03-24 DIAGNOSIS — R412 Retrograde amnesia: Secondary | ICD-10-CM | POA: Diagnosis not present

## 2018-03-25 DIAGNOSIS — I10 Essential (primary) hypertension: Secondary | ICD-10-CM | POA: Diagnosis not present

## 2018-03-25 DIAGNOSIS — F419 Anxiety disorder, unspecified: Secondary | ICD-10-CM | POA: Diagnosis not present

## 2018-03-25 DIAGNOSIS — R412 Retrograde amnesia: Secondary | ICD-10-CM | POA: Diagnosis not present

## 2018-03-25 DIAGNOSIS — M810 Age-related osteoporosis without current pathological fracture: Secondary | ICD-10-CM | POA: Diagnosis not present

## 2018-03-26 DIAGNOSIS — F419 Anxiety disorder, unspecified: Secondary | ICD-10-CM | POA: Diagnosis not present

## 2018-03-26 DIAGNOSIS — M810 Age-related osteoporosis without current pathological fracture: Secondary | ICD-10-CM | POA: Diagnosis not present

## 2018-03-26 DIAGNOSIS — R412 Retrograde amnesia: Secondary | ICD-10-CM | POA: Diagnosis not present

## 2018-03-26 DIAGNOSIS — I10 Essential (primary) hypertension: Secondary | ICD-10-CM | POA: Diagnosis not present

## 2018-03-27 DIAGNOSIS — M810 Age-related osteoporosis without current pathological fracture: Secondary | ICD-10-CM | POA: Diagnosis not present

## 2018-03-27 DIAGNOSIS — F419 Anxiety disorder, unspecified: Secondary | ICD-10-CM | POA: Diagnosis not present

## 2018-03-27 DIAGNOSIS — I10 Essential (primary) hypertension: Secondary | ICD-10-CM | POA: Diagnosis not present

## 2018-03-27 DIAGNOSIS — R412 Retrograde amnesia: Secondary | ICD-10-CM | POA: Diagnosis not present

## 2018-03-28 DIAGNOSIS — M810 Age-related osteoporosis without current pathological fracture: Secondary | ICD-10-CM | POA: Diagnosis not present

## 2018-03-28 DIAGNOSIS — F419 Anxiety disorder, unspecified: Secondary | ICD-10-CM | POA: Diagnosis not present

## 2018-03-28 DIAGNOSIS — R412 Retrograde amnesia: Secondary | ICD-10-CM | POA: Diagnosis not present

## 2018-03-28 DIAGNOSIS — I10 Essential (primary) hypertension: Secondary | ICD-10-CM | POA: Diagnosis not present

## 2018-03-29 DIAGNOSIS — M810 Age-related osteoporosis without current pathological fracture: Secondary | ICD-10-CM | POA: Diagnosis not present

## 2018-03-29 DIAGNOSIS — R412 Retrograde amnesia: Secondary | ICD-10-CM | POA: Diagnosis not present

## 2018-03-29 DIAGNOSIS — F419 Anxiety disorder, unspecified: Secondary | ICD-10-CM | POA: Diagnosis not present

## 2018-03-29 DIAGNOSIS — I10 Essential (primary) hypertension: Secondary | ICD-10-CM | POA: Diagnosis not present

## 2018-03-30 DIAGNOSIS — I10 Essential (primary) hypertension: Secondary | ICD-10-CM | POA: Diagnosis not present

## 2018-03-30 DIAGNOSIS — R412 Retrograde amnesia: Secondary | ICD-10-CM | POA: Diagnosis not present

## 2018-03-30 DIAGNOSIS — F419 Anxiety disorder, unspecified: Secondary | ICD-10-CM | POA: Diagnosis not present

## 2018-03-30 DIAGNOSIS — M810 Age-related osteoporosis without current pathological fracture: Secondary | ICD-10-CM | POA: Diagnosis not present

## 2018-03-31 DIAGNOSIS — I10 Essential (primary) hypertension: Secondary | ICD-10-CM | POA: Diagnosis not present

## 2018-03-31 DIAGNOSIS — M810 Age-related osteoporosis without current pathological fracture: Secondary | ICD-10-CM | POA: Diagnosis not present

## 2018-03-31 DIAGNOSIS — R412 Retrograde amnesia: Secondary | ICD-10-CM | POA: Diagnosis not present

## 2018-03-31 DIAGNOSIS — F419 Anxiety disorder, unspecified: Secondary | ICD-10-CM | POA: Diagnosis not present

## 2018-04-01 DIAGNOSIS — M810 Age-related osteoporosis without current pathological fracture: Secondary | ICD-10-CM | POA: Diagnosis not present

## 2018-04-01 DIAGNOSIS — R412 Retrograde amnesia: Secondary | ICD-10-CM | POA: Diagnosis not present

## 2018-04-01 DIAGNOSIS — F419 Anxiety disorder, unspecified: Secondary | ICD-10-CM | POA: Diagnosis not present

## 2018-04-01 DIAGNOSIS — I10 Essential (primary) hypertension: Secondary | ICD-10-CM | POA: Diagnosis not present

## 2018-04-02 DIAGNOSIS — I10 Essential (primary) hypertension: Secondary | ICD-10-CM | POA: Diagnosis not present

## 2018-04-02 DIAGNOSIS — R412 Retrograde amnesia: Secondary | ICD-10-CM | POA: Diagnosis not present

## 2018-04-02 DIAGNOSIS — M810 Age-related osteoporosis without current pathological fracture: Secondary | ICD-10-CM | POA: Diagnosis not present

## 2018-04-02 DIAGNOSIS — F419 Anxiety disorder, unspecified: Secondary | ICD-10-CM | POA: Diagnosis not present

## 2018-04-03 DIAGNOSIS — I10 Essential (primary) hypertension: Secondary | ICD-10-CM | POA: Diagnosis not present

## 2018-04-03 DIAGNOSIS — R412 Retrograde amnesia: Secondary | ICD-10-CM | POA: Diagnosis not present

## 2018-04-03 DIAGNOSIS — F419 Anxiety disorder, unspecified: Secondary | ICD-10-CM | POA: Diagnosis not present

## 2018-04-03 DIAGNOSIS — M810 Age-related osteoporosis without current pathological fracture: Secondary | ICD-10-CM | POA: Diagnosis not present

## 2018-04-04 DIAGNOSIS — R412 Retrograde amnesia: Secondary | ICD-10-CM | POA: Diagnosis not present

## 2018-04-04 DIAGNOSIS — F419 Anxiety disorder, unspecified: Secondary | ICD-10-CM | POA: Diagnosis not present

## 2018-04-04 DIAGNOSIS — I10 Essential (primary) hypertension: Secondary | ICD-10-CM | POA: Diagnosis not present

## 2018-04-04 DIAGNOSIS — M810 Age-related osteoporosis without current pathological fracture: Secondary | ICD-10-CM | POA: Diagnosis not present

## 2018-04-05 DIAGNOSIS — I10 Essential (primary) hypertension: Secondary | ICD-10-CM | POA: Diagnosis not present

## 2018-04-05 DIAGNOSIS — R412 Retrograde amnesia: Secondary | ICD-10-CM | POA: Diagnosis not present

## 2018-04-05 DIAGNOSIS — F419 Anxiety disorder, unspecified: Secondary | ICD-10-CM | POA: Diagnosis not present

## 2018-04-05 DIAGNOSIS — M810 Age-related osteoporosis without current pathological fracture: Secondary | ICD-10-CM | POA: Diagnosis not present

## 2018-04-06 DIAGNOSIS — I10 Essential (primary) hypertension: Secondary | ICD-10-CM | POA: Diagnosis not present

## 2018-04-06 DIAGNOSIS — F419 Anxiety disorder, unspecified: Secondary | ICD-10-CM | POA: Diagnosis not present

## 2018-04-06 DIAGNOSIS — M810 Age-related osteoporosis without current pathological fracture: Secondary | ICD-10-CM | POA: Diagnosis not present

## 2018-04-06 DIAGNOSIS — R412 Retrograde amnesia: Secondary | ICD-10-CM | POA: Diagnosis not present

## 2018-04-07 DIAGNOSIS — I10 Essential (primary) hypertension: Secondary | ICD-10-CM | POA: Diagnosis not present

## 2018-04-07 DIAGNOSIS — F419 Anxiety disorder, unspecified: Secondary | ICD-10-CM | POA: Diagnosis not present

## 2018-04-07 DIAGNOSIS — R412 Retrograde amnesia: Secondary | ICD-10-CM | POA: Diagnosis not present

## 2018-04-07 DIAGNOSIS — M810 Age-related osteoporosis without current pathological fracture: Secondary | ICD-10-CM | POA: Diagnosis not present

## 2018-04-08 DIAGNOSIS — I10 Essential (primary) hypertension: Secondary | ICD-10-CM | POA: Diagnosis not present

## 2018-04-08 DIAGNOSIS — R412 Retrograde amnesia: Secondary | ICD-10-CM | POA: Diagnosis not present

## 2018-04-08 DIAGNOSIS — F419 Anxiety disorder, unspecified: Secondary | ICD-10-CM | POA: Diagnosis not present

## 2018-04-08 DIAGNOSIS — M810 Age-related osteoporosis without current pathological fracture: Secondary | ICD-10-CM | POA: Diagnosis not present

## 2018-04-09 DIAGNOSIS — R412 Retrograde amnesia: Secondary | ICD-10-CM | POA: Diagnosis not present

## 2018-04-09 DIAGNOSIS — I10 Essential (primary) hypertension: Secondary | ICD-10-CM | POA: Diagnosis not present

## 2018-04-09 DIAGNOSIS — M810 Age-related osteoporosis without current pathological fracture: Secondary | ICD-10-CM | POA: Diagnosis not present

## 2018-04-09 DIAGNOSIS — F419 Anxiety disorder, unspecified: Secondary | ICD-10-CM | POA: Diagnosis not present

## 2018-04-10 DIAGNOSIS — I10 Essential (primary) hypertension: Secondary | ICD-10-CM | POA: Diagnosis not present

## 2018-04-10 DIAGNOSIS — M810 Age-related osteoporosis without current pathological fracture: Secondary | ICD-10-CM | POA: Diagnosis not present

## 2018-04-10 DIAGNOSIS — F419 Anxiety disorder, unspecified: Secondary | ICD-10-CM | POA: Diagnosis not present

## 2018-04-10 DIAGNOSIS — R412 Retrograde amnesia: Secondary | ICD-10-CM | POA: Diagnosis not present

## 2018-04-11 DIAGNOSIS — R412 Retrograde amnesia: Secondary | ICD-10-CM | POA: Diagnosis not present

## 2018-04-11 DIAGNOSIS — F419 Anxiety disorder, unspecified: Secondary | ICD-10-CM | POA: Diagnosis not present

## 2018-04-11 DIAGNOSIS — I10 Essential (primary) hypertension: Secondary | ICD-10-CM | POA: Diagnosis not present

## 2018-04-11 DIAGNOSIS — M810 Age-related osteoporosis without current pathological fracture: Secondary | ICD-10-CM | POA: Diagnosis not present

## 2018-04-12 DIAGNOSIS — F419 Anxiety disorder, unspecified: Secondary | ICD-10-CM | POA: Diagnosis not present

## 2018-04-12 DIAGNOSIS — R412 Retrograde amnesia: Secondary | ICD-10-CM | POA: Diagnosis not present

## 2018-04-12 DIAGNOSIS — I10 Essential (primary) hypertension: Secondary | ICD-10-CM | POA: Diagnosis not present

## 2018-04-12 DIAGNOSIS — M810 Age-related osteoporosis without current pathological fracture: Secondary | ICD-10-CM | POA: Diagnosis not present

## 2018-04-13 DIAGNOSIS — R412 Retrograde amnesia: Secondary | ICD-10-CM | POA: Diagnosis not present

## 2018-04-13 DIAGNOSIS — F419 Anxiety disorder, unspecified: Secondary | ICD-10-CM | POA: Diagnosis not present

## 2018-04-13 DIAGNOSIS — I10 Essential (primary) hypertension: Secondary | ICD-10-CM | POA: Diagnosis not present

## 2018-04-13 DIAGNOSIS — M810 Age-related osteoporosis without current pathological fracture: Secondary | ICD-10-CM | POA: Diagnosis not present

## 2018-04-14 DIAGNOSIS — I10 Essential (primary) hypertension: Secondary | ICD-10-CM | POA: Diagnosis not present

## 2018-04-14 DIAGNOSIS — R412 Retrograde amnesia: Secondary | ICD-10-CM | POA: Diagnosis not present

## 2018-04-14 DIAGNOSIS — M810 Age-related osteoporosis without current pathological fracture: Secondary | ICD-10-CM | POA: Diagnosis not present

## 2018-04-14 DIAGNOSIS — F419 Anxiety disorder, unspecified: Secondary | ICD-10-CM | POA: Diagnosis not present

## 2018-04-15 DIAGNOSIS — M810 Age-related osteoporosis without current pathological fracture: Secondary | ICD-10-CM | POA: Diagnosis not present

## 2018-04-15 DIAGNOSIS — F419 Anxiety disorder, unspecified: Secondary | ICD-10-CM | POA: Diagnosis not present

## 2018-04-15 DIAGNOSIS — R412 Retrograde amnesia: Secondary | ICD-10-CM | POA: Diagnosis not present

## 2018-04-15 DIAGNOSIS — I10 Essential (primary) hypertension: Secondary | ICD-10-CM | POA: Diagnosis not present

## 2018-04-16 DIAGNOSIS — R412 Retrograde amnesia: Secondary | ICD-10-CM | POA: Diagnosis not present

## 2018-04-16 DIAGNOSIS — F419 Anxiety disorder, unspecified: Secondary | ICD-10-CM | POA: Diagnosis not present

## 2018-04-16 DIAGNOSIS — M810 Age-related osteoporosis without current pathological fracture: Secondary | ICD-10-CM | POA: Diagnosis not present

## 2018-04-16 DIAGNOSIS — I10 Essential (primary) hypertension: Secondary | ICD-10-CM | POA: Diagnosis not present

## 2018-04-17 DIAGNOSIS — R412 Retrograde amnesia: Secondary | ICD-10-CM | POA: Diagnosis not present

## 2018-04-17 DIAGNOSIS — M810 Age-related osteoporosis without current pathological fracture: Secondary | ICD-10-CM | POA: Diagnosis not present

## 2018-04-17 DIAGNOSIS — F419 Anxiety disorder, unspecified: Secondary | ICD-10-CM | POA: Diagnosis not present

## 2018-04-17 DIAGNOSIS — I10 Essential (primary) hypertension: Secondary | ICD-10-CM | POA: Diagnosis not present

## 2018-04-18 DIAGNOSIS — F419 Anxiety disorder, unspecified: Secondary | ICD-10-CM | POA: Diagnosis not present

## 2018-04-18 DIAGNOSIS — I10 Essential (primary) hypertension: Secondary | ICD-10-CM | POA: Diagnosis not present

## 2018-04-18 DIAGNOSIS — R412 Retrograde amnesia: Secondary | ICD-10-CM | POA: Diagnosis not present

## 2018-04-18 DIAGNOSIS — M810 Age-related osteoporosis without current pathological fracture: Secondary | ICD-10-CM | POA: Diagnosis not present

## 2018-04-19 DIAGNOSIS — I10 Essential (primary) hypertension: Secondary | ICD-10-CM | POA: Diagnosis not present

## 2018-04-19 DIAGNOSIS — M810 Age-related osteoporosis without current pathological fracture: Secondary | ICD-10-CM | POA: Diagnosis not present

## 2018-04-19 DIAGNOSIS — R412 Retrograde amnesia: Secondary | ICD-10-CM | POA: Diagnosis not present

## 2018-04-19 DIAGNOSIS — F419 Anxiety disorder, unspecified: Secondary | ICD-10-CM | POA: Diagnosis not present

## 2018-04-20 DIAGNOSIS — R412 Retrograde amnesia: Secondary | ICD-10-CM | POA: Diagnosis not present

## 2018-04-20 DIAGNOSIS — I10 Essential (primary) hypertension: Secondary | ICD-10-CM | POA: Diagnosis not present

## 2018-04-20 DIAGNOSIS — F419 Anxiety disorder, unspecified: Secondary | ICD-10-CM | POA: Diagnosis not present

## 2018-04-20 DIAGNOSIS — M810 Age-related osteoporosis without current pathological fracture: Secondary | ICD-10-CM | POA: Diagnosis not present

## 2018-04-21 DIAGNOSIS — F419 Anxiety disorder, unspecified: Secondary | ICD-10-CM | POA: Diagnosis not present

## 2018-04-21 DIAGNOSIS — R412 Retrograde amnesia: Secondary | ICD-10-CM | POA: Diagnosis not present

## 2018-04-21 DIAGNOSIS — Z6828 Body mass index (BMI) 28.0-28.9, adult: Secondary | ICD-10-CM | POA: Diagnosis not present

## 2018-04-21 DIAGNOSIS — I10 Essential (primary) hypertension: Secondary | ICD-10-CM | POA: Diagnosis not present

## 2018-04-21 DIAGNOSIS — M810 Age-related osteoporosis without current pathological fracture: Secondary | ICD-10-CM | POA: Diagnosis not present

## 2018-04-21 DIAGNOSIS — Z0000 Encounter for general adult medical examination without abnormal findings: Secondary | ICD-10-CM | POA: Diagnosis not present

## 2018-04-22 DIAGNOSIS — M810 Age-related osteoporosis without current pathological fracture: Secondary | ICD-10-CM | POA: Diagnosis not present

## 2018-04-22 DIAGNOSIS — R412 Retrograde amnesia: Secondary | ICD-10-CM | POA: Diagnosis not present

## 2018-04-22 DIAGNOSIS — I10 Essential (primary) hypertension: Secondary | ICD-10-CM | POA: Diagnosis not present

## 2018-04-22 DIAGNOSIS — F419 Anxiety disorder, unspecified: Secondary | ICD-10-CM | POA: Diagnosis not present

## 2018-04-23 DIAGNOSIS — F419 Anxiety disorder, unspecified: Secondary | ICD-10-CM | POA: Diagnosis not present

## 2018-04-23 DIAGNOSIS — I10 Essential (primary) hypertension: Secondary | ICD-10-CM | POA: Diagnosis not present

## 2018-04-23 DIAGNOSIS — R412 Retrograde amnesia: Secondary | ICD-10-CM | POA: Diagnosis not present

## 2018-04-23 DIAGNOSIS — M810 Age-related osteoporosis without current pathological fracture: Secondary | ICD-10-CM | POA: Diagnosis not present

## 2018-04-24 DIAGNOSIS — F419 Anxiety disorder, unspecified: Secondary | ICD-10-CM | POA: Diagnosis not present

## 2018-04-24 DIAGNOSIS — M810 Age-related osteoporosis without current pathological fracture: Secondary | ICD-10-CM | POA: Diagnosis not present

## 2018-04-24 DIAGNOSIS — I10 Essential (primary) hypertension: Secondary | ICD-10-CM | POA: Diagnosis not present

## 2018-04-24 DIAGNOSIS — R412 Retrograde amnesia: Secondary | ICD-10-CM | POA: Diagnosis not present

## 2018-04-25 DIAGNOSIS — M810 Age-related osteoporosis without current pathological fracture: Secondary | ICD-10-CM | POA: Diagnosis not present

## 2018-04-25 DIAGNOSIS — F419 Anxiety disorder, unspecified: Secondary | ICD-10-CM | POA: Diagnosis not present

## 2018-04-25 DIAGNOSIS — I10 Essential (primary) hypertension: Secondary | ICD-10-CM | POA: Diagnosis not present

## 2018-04-25 DIAGNOSIS — R412 Retrograde amnesia: Secondary | ICD-10-CM | POA: Diagnosis not present

## 2018-04-26 DIAGNOSIS — M810 Age-related osteoporosis without current pathological fracture: Secondary | ICD-10-CM | POA: Diagnosis not present

## 2018-04-26 DIAGNOSIS — I10 Essential (primary) hypertension: Secondary | ICD-10-CM | POA: Diagnosis not present

## 2018-04-26 DIAGNOSIS — R412 Retrograde amnesia: Secondary | ICD-10-CM | POA: Diagnosis not present

## 2018-04-26 DIAGNOSIS — F419 Anxiety disorder, unspecified: Secondary | ICD-10-CM | POA: Diagnosis not present

## 2018-04-27 DIAGNOSIS — I10 Essential (primary) hypertension: Secondary | ICD-10-CM | POA: Diagnosis not present

## 2018-04-27 DIAGNOSIS — R412 Retrograde amnesia: Secondary | ICD-10-CM | POA: Diagnosis not present

## 2018-04-27 DIAGNOSIS — M810 Age-related osteoporosis without current pathological fracture: Secondary | ICD-10-CM | POA: Diagnosis not present

## 2018-04-27 DIAGNOSIS — F419 Anxiety disorder, unspecified: Secondary | ICD-10-CM | POA: Diagnosis not present

## 2018-04-28 DIAGNOSIS — F419 Anxiety disorder, unspecified: Secondary | ICD-10-CM | POA: Diagnosis not present

## 2018-04-28 DIAGNOSIS — R412 Retrograde amnesia: Secondary | ICD-10-CM | POA: Diagnosis not present

## 2018-04-28 DIAGNOSIS — I10 Essential (primary) hypertension: Secondary | ICD-10-CM | POA: Diagnosis not present

## 2018-04-28 DIAGNOSIS — M810 Age-related osteoporosis without current pathological fracture: Secondary | ICD-10-CM | POA: Diagnosis not present

## 2018-04-29 DIAGNOSIS — R412 Retrograde amnesia: Secondary | ICD-10-CM | POA: Diagnosis not present

## 2018-04-29 DIAGNOSIS — I10 Essential (primary) hypertension: Secondary | ICD-10-CM | POA: Diagnosis not present

## 2018-04-29 DIAGNOSIS — M810 Age-related osteoporosis without current pathological fracture: Secondary | ICD-10-CM | POA: Diagnosis not present

## 2018-04-29 DIAGNOSIS — F419 Anxiety disorder, unspecified: Secondary | ICD-10-CM | POA: Diagnosis not present

## 2018-04-30 DIAGNOSIS — F419 Anxiety disorder, unspecified: Secondary | ICD-10-CM | POA: Diagnosis not present

## 2018-04-30 DIAGNOSIS — I10 Essential (primary) hypertension: Secondary | ICD-10-CM | POA: Diagnosis not present

## 2018-04-30 DIAGNOSIS — R412 Retrograde amnesia: Secondary | ICD-10-CM | POA: Diagnosis not present

## 2018-04-30 DIAGNOSIS — M810 Age-related osteoporosis without current pathological fracture: Secondary | ICD-10-CM | POA: Diagnosis not present

## 2018-05-01 DIAGNOSIS — R412 Retrograde amnesia: Secondary | ICD-10-CM | POA: Diagnosis not present

## 2018-05-01 DIAGNOSIS — F419 Anxiety disorder, unspecified: Secondary | ICD-10-CM | POA: Diagnosis not present

## 2018-05-01 DIAGNOSIS — I10 Essential (primary) hypertension: Secondary | ICD-10-CM | POA: Diagnosis not present

## 2018-05-01 DIAGNOSIS — M810 Age-related osteoporosis without current pathological fracture: Secondary | ICD-10-CM | POA: Diagnosis not present

## 2018-05-02 DIAGNOSIS — M810 Age-related osteoporosis without current pathological fracture: Secondary | ICD-10-CM | POA: Diagnosis not present

## 2018-05-02 DIAGNOSIS — R412 Retrograde amnesia: Secondary | ICD-10-CM | POA: Diagnosis not present

## 2018-05-02 DIAGNOSIS — F419 Anxiety disorder, unspecified: Secondary | ICD-10-CM | POA: Diagnosis not present

## 2018-05-02 DIAGNOSIS — I10 Essential (primary) hypertension: Secondary | ICD-10-CM | POA: Diagnosis not present

## 2018-05-03 DIAGNOSIS — F419 Anxiety disorder, unspecified: Secondary | ICD-10-CM | POA: Diagnosis not present

## 2018-05-03 DIAGNOSIS — M810 Age-related osteoporosis without current pathological fracture: Secondary | ICD-10-CM | POA: Diagnosis not present

## 2018-05-03 DIAGNOSIS — R412 Retrograde amnesia: Secondary | ICD-10-CM | POA: Diagnosis not present

## 2018-05-03 DIAGNOSIS — I10 Essential (primary) hypertension: Secondary | ICD-10-CM | POA: Diagnosis not present

## 2018-05-04 DIAGNOSIS — M810 Age-related osteoporosis without current pathological fracture: Secondary | ICD-10-CM | POA: Diagnosis not present

## 2018-05-04 DIAGNOSIS — I10 Essential (primary) hypertension: Secondary | ICD-10-CM | POA: Diagnosis not present

## 2018-05-04 DIAGNOSIS — F419 Anxiety disorder, unspecified: Secondary | ICD-10-CM | POA: Diagnosis not present

## 2018-05-04 DIAGNOSIS — R412 Retrograde amnesia: Secondary | ICD-10-CM | POA: Diagnosis not present

## 2018-05-05 DIAGNOSIS — F419 Anxiety disorder, unspecified: Secondary | ICD-10-CM | POA: Diagnosis not present

## 2018-05-05 DIAGNOSIS — R412 Retrograde amnesia: Secondary | ICD-10-CM | POA: Diagnosis not present

## 2018-05-05 DIAGNOSIS — I10 Essential (primary) hypertension: Secondary | ICD-10-CM | POA: Diagnosis not present

## 2018-05-05 DIAGNOSIS — M810 Age-related osteoporosis without current pathological fracture: Secondary | ICD-10-CM | POA: Diagnosis not present

## 2018-05-06 DIAGNOSIS — I10 Essential (primary) hypertension: Secondary | ICD-10-CM | POA: Diagnosis not present

## 2018-05-06 DIAGNOSIS — M810 Age-related osteoporosis without current pathological fracture: Secondary | ICD-10-CM | POA: Diagnosis not present

## 2018-05-06 DIAGNOSIS — F419 Anxiety disorder, unspecified: Secondary | ICD-10-CM | POA: Diagnosis not present

## 2018-05-06 DIAGNOSIS — R412 Retrograde amnesia: Secondary | ICD-10-CM | POA: Diagnosis not present

## 2018-05-07 DIAGNOSIS — M810 Age-related osteoporosis without current pathological fracture: Secondary | ICD-10-CM | POA: Diagnosis not present

## 2018-05-07 DIAGNOSIS — F419 Anxiety disorder, unspecified: Secondary | ICD-10-CM | POA: Diagnosis not present

## 2018-05-07 DIAGNOSIS — I10 Essential (primary) hypertension: Secondary | ICD-10-CM | POA: Diagnosis not present

## 2018-05-07 DIAGNOSIS — R412 Retrograde amnesia: Secondary | ICD-10-CM | POA: Diagnosis not present

## 2018-05-08 DIAGNOSIS — M810 Age-related osteoporosis without current pathological fracture: Secondary | ICD-10-CM | POA: Diagnosis not present

## 2018-05-08 DIAGNOSIS — F419 Anxiety disorder, unspecified: Secondary | ICD-10-CM | POA: Diagnosis not present

## 2018-05-08 DIAGNOSIS — R412 Retrograde amnesia: Secondary | ICD-10-CM | POA: Diagnosis not present

## 2018-05-08 DIAGNOSIS — I10 Essential (primary) hypertension: Secondary | ICD-10-CM | POA: Diagnosis not present

## 2018-05-09 DIAGNOSIS — M810 Age-related osteoporosis without current pathological fracture: Secondary | ICD-10-CM | POA: Diagnosis not present

## 2018-05-09 DIAGNOSIS — F419 Anxiety disorder, unspecified: Secondary | ICD-10-CM | POA: Diagnosis not present

## 2018-05-09 DIAGNOSIS — I10 Essential (primary) hypertension: Secondary | ICD-10-CM | POA: Diagnosis not present

## 2018-05-09 DIAGNOSIS — R412 Retrograde amnesia: Secondary | ICD-10-CM | POA: Diagnosis not present

## 2018-05-10 DIAGNOSIS — R412 Retrograde amnesia: Secondary | ICD-10-CM | POA: Diagnosis not present

## 2018-05-10 DIAGNOSIS — M810 Age-related osteoporosis without current pathological fracture: Secondary | ICD-10-CM | POA: Diagnosis not present

## 2018-05-10 DIAGNOSIS — I10 Essential (primary) hypertension: Secondary | ICD-10-CM | POA: Diagnosis not present

## 2018-05-10 DIAGNOSIS — F419 Anxiety disorder, unspecified: Secondary | ICD-10-CM | POA: Diagnosis not present

## 2018-05-11 DIAGNOSIS — F419 Anxiety disorder, unspecified: Secondary | ICD-10-CM | POA: Diagnosis not present

## 2018-05-11 DIAGNOSIS — I10 Essential (primary) hypertension: Secondary | ICD-10-CM | POA: Diagnosis not present

## 2018-05-11 DIAGNOSIS — M810 Age-related osteoporosis without current pathological fracture: Secondary | ICD-10-CM | POA: Diagnosis not present

## 2018-05-11 DIAGNOSIS — R412 Retrograde amnesia: Secondary | ICD-10-CM | POA: Diagnosis not present

## 2018-05-12 DIAGNOSIS — I10 Essential (primary) hypertension: Secondary | ICD-10-CM | POA: Diagnosis not present

## 2018-05-12 DIAGNOSIS — F419 Anxiety disorder, unspecified: Secondary | ICD-10-CM | POA: Diagnosis not present

## 2018-05-12 DIAGNOSIS — R412 Retrograde amnesia: Secondary | ICD-10-CM | POA: Diagnosis not present

## 2018-05-12 DIAGNOSIS — M810 Age-related osteoporosis without current pathological fracture: Secondary | ICD-10-CM | POA: Diagnosis not present

## 2018-05-13 DIAGNOSIS — M810 Age-related osteoporosis without current pathological fracture: Secondary | ICD-10-CM | POA: Diagnosis not present

## 2018-05-13 DIAGNOSIS — R412 Retrograde amnesia: Secondary | ICD-10-CM | POA: Diagnosis not present

## 2018-05-13 DIAGNOSIS — I10 Essential (primary) hypertension: Secondary | ICD-10-CM | POA: Diagnosis not present

## 2018-05-13 DIAGNOSIS — F419 Anxiety disorder, unspecified: Secondary | ICD-10-CM | POA: Diagnosis not present

## 2018-05-14 DIAGNOSIS — I10 Essential (primary) hypertension: Secondary | ICD-10-CM | POA: Diagnosis not present

## 2018-05-14 DIAGNOSIS — R412 Retrograde amnesia: Secondary | ICD-10-CM | POA: Diagnosis not present

## 2018-05-14 DIAGNOSIS — M810 Age-related osteoporosis without current pathological fracture: Secondary | ICD-10-CM | POA: Diagnosis not present

## 2018-05-14 DIAGNOSIS — F419 Anxiety disorder, unspecified: Secondary | ICD-10-CM | POA: Diagnosis not present

## 2018-05-15 DIAGNOSIS — R412 Retrograde amnesia: Secondary | ICD-10-CM | POA: Diagnosis not present

## 2018-05-15 DIAGNOSIS — M810 Age-related osteoporosis without current pathological fracture: Secondary | ICD-10-CM | POA: Diagnosis not present

## 2018-05-15 DIAGNOSIS — I10 Essential (primary) hypertension: Secondary | ICD-10-CM | POA: Diagnosis not present

## 2018-05-15 DIAGNOSIS — F419 Anxiety disorder, unspecified: Secondary | ICD-10-CM | POA: Diagnosis not present

## 2018-05-16 DIAGNOSIS — F419 Anxiety disorder, unspecified: Secondary | ICD-10-CM | POA: Diagnosis not present

## 2018-05-16 DIAGNOSIS — M810 Age-related osteoporosis without current pathological fracture: Secondary | ICD-10-CM | POA: Diagnosis not present

## 2018-05-16 DIAGNOSIS — R412 Retrograde amnesia: Secondary | ICD-10-CM | POA: Diagnosis not present

## 2018-05-16 DIAGNOSIS — I10 Essential (primary) hypertension: Secondary | ICD-10-CM | POA: Diagnosis not present

## 2018-05-17 DIAGNOSIS — M810 Age-related osteoporosis without current pathological fracture: Secondary | ICD-10-CM | POA: Diagnosis not present

## 2018-05-17 DIAGNOSIS — R412 Retrograde amnesia: Secondary | ICD-10-CM | POA: Diagnosis not present

## 2018-05-17 DIAGNOSIS — I10 Essential (primary) hypertension: Secondary | ICD-10-CM | POA: Diagnosis not present

## 2018-05-17 DIAGNOSIS — F419 Anxiety disorder, unspecified: Secondary | ICD-10-CM | POA: Diagnosis not present

## 2018-05-18 DIAGNOSIS — R412 Retrograde amnesia: Secondary | ICD-10-CM | POA: Diagnosis not present

## 2018-05-18 DIAGNOSIS — I10 Essential (primary) hypertension: Secondary | ICD-10-CM | POA: Diagnosis not present

## 2018-05-18 DIAGNOSIS — M810 Age-related osteoporosis without current pathological fracture: Secondary | ICD-10-CM | POA: Diagnosis not present

## 2018-05-18 DIAGNOSIS — F419 Anxiety disorder, unspecified: Secondary | ICD-10-CM | POA: Diagnosis not present

## 2018-05-19 DIAGNOSIS — I10 Essential (primary) hypertension: Secondary | ICD-10-CM | POA: Diagnosis not present

## 2018-05-19 DIAGNOSIS — R412 Retrograde amnesia: Secondary | ICD-10-CM | POA: Diagnosis not present

## 2018-05-19 DIAGNOSIS — M810 Age-related osteoporosis without current pathological fracture: Secondary | ICD-10-CM | POA: Diagnosis not present

## 2018-05-19 DIAGNOSIS — F419 Anxiety disorder, unspecified: Secondary | ICD-10-CM | POA: Diagnosis not present

## 2018-05-20 DIAGNOSIS — F419 Anxiety disorder, unspecified: Secondary | ICD-10-CM | POA: Diagnosis not present

## 2018-05-20 DIAGNOSIS — R412 Retrograde amnesia: Secondary | ICD-10-CM | POA: Diagnosis not present

## 2018-05-20 DIAGNOSIS — M810 Age-related osteoporosis without current pathological fracture: Secondary | ICD-10-CM | POA: Diagnosis not present

## 2018-05-20 DIAGNOSIS — I10 Essential (primary) hypertension: Secondary | ICD-10-CM | POA: Diagnosis not present

## 2018-05-21 DIAGNOSIS — F419 Anxiety disorder, unspecified: Secondary | ICD-10-CM | POA: Diagnosis not present

## 2018-05-21 DIAGNOSIS — I10 Essential (primary) hypertension: Secondary | ICD-10-CM | POA: Diagnosis not present

## 2018-05-21 DIAGNOSIS — M810 Age-related osteoporosis without current pathological fracture: Secondary | ICD-10-CM | POA: Diagnosis not present

## 2018-05-21 DIAGNOSIS — R412 Retrograde amnesia: Secondary | ICD-10-CM | POA: Diagnosis not present

## 2018-05-22 DIAGNOSIS — R412 Retrograde amnesia: Secondary | ICD-10-CM | POA: Diagnosis not present

## 2018-05-22 DIAGNOSIS — M810 Age-related osteoporosis without current pathological fracture: Secondary | ICD-10-CM | POA: Diagnosis not present

## 2018-05-22 DIAGNOSIS — I10 Essential (primary) hypertension: Secondary | ICD-10-CM | POA: Diagnosis not present

## 2018-05-22 DIAGNOSIS — F419 Anxiety disorder, unspecified: Secondary | ICD-10-CM | POA: Diagnosis not present

## 2018-05-23 DIAGNOSIS — F419 Anxiety disorder, unspecified: Secondary | ICD-10-CM | POA: Diagnosis not present

## 2018-05-23 DIAGNOSIS — R412 Retrograde amnesia: Secondary | ICD-10-CM | POA: Diagnosis not present

## 2018-05-23 DIAGNOSIS — M810 Age-related osteoporosis without current pathological fracture: Secondary | ICD-10-CM | POA: Diagnosis not present

## 2018-05-23 DIAGNOSIS — I10 Essential (primary) hypertension: Secondary | ICD-10-CM | POA: Diagnosis not present

## 2018-05-24 DIAGNOSIS — M810 Age-related osteoporosis without current pathological fracture: Secondary | ICD-10-CM | POA: Diagnosis not present

## 2018-05-24 DIAGNOSIS — F419 Anxiety disorder, unspecified: Secondary | ICD-10-CM | POA: Diagnosis not present

## 2018-05-24 DIAGNOSIS — I10 Essential (primary) hypertension: Secondary | ICD-10-CM | POA: Diagnosis not present

## 2018-05-24 DIAGNOSIS — R412 Retrograde amnesia: Secondary | ICD-10-CM | POA: Diagnosis not present

## 2018-05-25 DIAGNOSIS — I10 Essential (primary) hypertension: Secondary | ICD-10-CM | POA: Diagnosis not present

## 2018-05-25 DIAGNOSIS — M810 Age-related osteoporosis without current pathological fracture: Secondary | ICD-10-CM | POA: Diagnosis not present

## 2018-05-25 DIAGNOSIS — R412 Retrograde amnesia: Secondary | ICD-10-CM | POA: Diagnosis not present

## 2018-05-25 DIAGNOSIS — F419 Anxiety disorder, unspecified: Secondary | ICD-10-CM | POA: Diagnosis not present

## 2018-05-26 DIAGNOSIS — F419 Anxiety disorder, unspecified: Secondary | ICD-10-CM | POA: Diagnosis not present

## 2018-05-26 DIAGNOSIS — M810 Age-related osteoporosis without current pathological fracture: Secondary | ICD-10-CM | POA: Diagnosis not present

## 2018-05-26 DIAGNOSIS — I10 Essential (primary) hypertension: Secondary | ICD-10-CM | POA: Diagnosis not present

## 2018-05-26 DIAGNOSIS — R412 Retrograde amnesia: Secondary | ICD-10-CM | POA: Diagnosis not present

## 2018-05-27 DIAGNOSIS — M810 Age-related osteoporosis without current pathological fracture: Secondary | ICD-10-CM | POA: Diagnosis not present

## 2018-05-27 DIAGNOSIS — F419 Anxiety disorder, unspecified: Secondary | ICD-10-CM | POA: Diagnosis not present

## 2018-05-27 DIAGNOSIS — I10 Essential (primary) hypertension: Secondary | ICD-10-CM | POA: Diagnosis not present

## 2018-05-27 DIAGNOSIS — R412 Retrograde amnesia: Secondary | ICD-10-CM | POA: Diagnosis not present

## 2018-05-28 DIAGNOSIS — I10 Essential (primary) hypertension: Secondary | ICD-10-CM | POA: Diagnosis not present

## 2018-05-28 DIAGNOSIS — M810 Age-related osteoporosis without current pathological fracture: Secondary | ICD-10-CM | POA: Diagnosis not present

## 2018-05-28 DIAGNOSIS — F419 Anxiety disorder, unspecified: Secondary | ICD-10-CM | POA: Diagnosis not present

## 2018-05-28 DIAGNOSIS — R412 Retrograde amnesia: Secondary | ICD-10-CM | POA: Diagnosis not present

## 2018-05-29 DIAGNOSIS — R412 Retrograde amnesia: Secondary | ICD-10-CM | POA: Diagnosis not present

## 2018-05-29 DIAGNOSIS — F419 Anxiety disorder, unspecified: Secondary | ICD-10-CM | POA: Diagnosis not present

## 2018-05-29 DIAGNOSIS — M810 Age-related osteoporosis without current pathological fracture: Secondary | ICD-10-CM | POA: Diagnosis not present

## 2018-05-29 DIAGNOSIS — I10 Essential (primary) hypertension: Secondary | ICD-10-CM | POA: Diagnosis not present

## 2018-05-30 DIAGNOSIS — R412 Retrograde amnesia: Secondary | ICD-10-CM | POA: Diagnosis not present

## 2018-05-30 DIAGNOSIS — M810 Age-related osteoporosis without current pathological fracture: Secondary | ICD-10-CM | POA: Diagnosis not present

## 2018-05-30 DIAGNOSIS — F419 Anxiety disorder, unspecified: Secondary | ICD-10-CM | POA: Diagnosis not present

## 2018-05-30 DIAGNOSIS — I10 Essential (primary) hypertension: Secondary | ICD-10-CM | POA: Diagnosis not present

## 2018-05-31 DIAGNOSIS — R412 Retrograde amnesia: Secondary | ICD-10-CM | POA: Diagnosis not present

## 2018-05-31 DIAGNOSIS — F419 Anxiety disorder, unspecified: Secondary | ICD-10-CM | POA: Diagnosis not present

## 2018-05-31 DIAGNOSIS — M810 Age-related osteoporosis without current pathological fracture: Secondary | ICD-10-CM | POA: Diagnosis not present

## 2018-05-31 DIAGNOSIS — I10 Essential (primary) hypertension: Secondary | ICD-10-CM | POA: Diagnosis not present

## 2018-06-01 DIAGNOSIS — F419 Anxiety disorder, unspecified: Secondary | ICD-10-CM | POA: Diagnosis not present

## 2018-06-01 DIAGNOSIS — M810 Age-related osteoporosis without current pathological fracture: Secondary | ICD-10-CM | POA: Diagnosis not present

## 2018-06-01 DIAGNOSIS — I10 Essential (primary) hypertension: Secondary | ICD-10-CM | POA: Diagnosis not present

## 2018-06-01 DIAGNOSIS — R412 Retrograde amnesia: Secondary | ICD-10-CM | POA: Diagnosis not present

## 2018-06-02 DIAGNOSIS — F419 Anxiety disorder, unspecified: Secondary | ICD-10-CM | POA: Diagnosis not present

## 2018-06-02 DIAGNOSIS — R412 Retrograde amnesia: Secondary | ICD-10-CM | POA: Diagnosis not present

## 2018-06-02 DIAGNOSIS — M810 Age-related osteoporosis without current pathological fracture: Secondary | ICD-10-CM | POA: Diagnosis not present

## 2018-06-02 DIAGNOSIS — I10 Essential (primary) hypertension: Secondary | ICD-10-CM | POA: Diagnosis not present

## 2018-06-03 DIAGNOSIS — M810 Age-related osteoporosis without current pathological fracture: Secondary | ICD-10-CM | POA: Diagnosis not present

## 2018-06-03 DIAGNOSIS — R412 Retrograde amnesia: Secondary | ICD-10-CM | POA: Diagnosis not present

## 2018-06-03 DIAGNOSIS — F419 Anxiety disorder, unspecified: Secondary | ICD-10-CM | POA: Diagnosis not present

## 2018-06-03 DIAGNOSIS — I10 Essential (primary) hypertension: Secondary | ICD-10-CM | POA: Diagnosis not present

## 2018-06-04 DIAGNOSIS — M810 Age-related osteoporosis without current pathological fracture: Secondary | ICD-10-CM | POA: Diagnosis not present

## 2018-06-04 DIAGNOSIS — F419 Anxiety disorder, unspecified: Secondary | ICD-10-CM | POA: Diagnosis not present

## 2018-06-04 DIAGNOSIS — I10 Essential (primary) hypertension: Secondary | ICD-10-CM | POA: Diagnosis not present

## 2018-06-04 DIAGNOSIS — R412 Retrograde amnesia: Secondary | ICD-10-CM | POA: Diagnosis not present

## 2018-06-05 DIAGNOSIS — I10 Essential (primary) hypertension: Secondary | ICD-10-CM | POA: Diagnosis not present

## 2018-06-05 DIAGNOSIS — R412 Retrograde amnesia: Secondary | ICD-10-CM | POA: Diagnosis not present

## 2018-06-05 DIAGNOSIS — M810 Age-related osteoporosis without current pathological fracture: Secondary | ICD-10-CM | POA: Diagnosis not present

## 2018-06-05 DIAGNOSIS — F419 Anxiety disorder, unspecified: Secondary | ICD-10-CM | POA: Diagnosis not present

## 2018-06-06 DIAGNOSIS — I10 Essential (primary) hypertension: Secondary | ICD-10-CM | POA: Diagnosis not present

## 2018-06-06 DIAGNOSIS — F419 Anxiety disorder, unspecified: Secondary | ICD-10-CM | POA: Diagnosis not present

## 2018-06-06 DIAGNOSIS — R412 Retrograde amnesia: Secondary | ICD-10-CM | POA: Diagnosis not present

## 2018-06-06 DIAGNOSIS — M810 Age-related osteoporosis without current pathological fracture: Secondary | ICD-10-CM | POA: Diagnosis not present

## 2018-06-07 DIAGNOSIS — R412 Retrograde amnesia: Secondary | ICD-10-CM | POA: Diagnosis not present

## 2018-06-07 DIAGNOSIS — I10 Essential (primary) hypertension: Secondary | ICD-10-CM | POA: Diagnosis not present

## 2018-06-07 DIAGNOSIS — M810 Age-related osteoporosis without current pathological fracture: Secondary | ICD-10-CM | POA: Diagnosis not present

## 2018-06-07 DIAGNOSIS — F419 Anxiety disorder, unspecified: Secondary | ICD-10-CM | POA: Diagnosis not present

## 2018-06-08 DIAGNOSIS — R412 Retrograde amnesia: Secondary | ICD-10-CM | POA: Diagnosis not present

## 2018-06-08 DIAGNOSIS — F419 Anxiety disorder, unspecified: Secondary | ICD-10-CM | POA: Diagnosis not present

## 2018-06-08 DIAGNOSIS — I10 Essential (primary) hypertension: Secondary | ICD-10-CM | POA: Diagnosis not present

## 2018-06-08 DIAGNOSIS — M810 Age-related osteoporosis without current pathological fracture: Secondary | ICD-10-CM | POA: Diagnosis not present

## 2018-06-09 DIAGNOSIS — R412 Retrograde amnesia: Secondary | ICD-10-CM | POA: Diagnosis not present

## 2018-06-09 DIAGNOSIS — F419 Anxiety disorder, unspecified: Secondary | ICD-10-CM | POA: Diagnosis not present

## 2018-06-09 DIAGNOSIS — I10 Essential (primary) hypertension: Secondary | ICD-10-CM | POA: Diagnosis not present

## 2018-06-09 DIAGNOSIS — M810 Age-related osteoporosis without current pathological fracture: Secondary | ICD-10-CM | POA: Diagnosis not present

## 2018-06-10 DIAGNOSIS — I10 Essential (primary) hypertension: Secondary | ICD-10-CM | POA: Diagnosis not present

## 2018-06-10 DIAGNOSIS — R412 Retrograde amnesia: Secondary | ICD-10-CM | POA: Diagnosis not present

## 2018-06-10 DIAGNOSIS — F419 Anxiety disorder, unspecified: Secondary | ICD-10-CM | POA: Diagnosis not present

## 2018-06-10 DIAGNOSIS — M810 Age-related osteoporosis without current pathological fracture: Secondary | ICD-10-CM | POA: Diagnosis not present

## 2018-06-11 DIAGNOSIS — M810 Age-related osteoporosis without current pathological fracture: Secondary | ICD-10-CM | POA: Diagnosis not present

## 2018-06-11 DIAGNOSIS — R412 Retrograde amnesia: Secondary | ICD-10-CM | POA: Diagnosis not present

## 2018-06-11 DIAGNOSIS — F419 Anxiety disorder, unspecified: Secondary | ICD-10-CM | POA: Diagnosis not present

## 2018-06-11 DIAGNOSIS — I10 Essential (primary) hypertension: Secondary | ICD-10-CM | POA: Diagnosis not present

## 2018-06-12 DIAGNOSIS — I10 Essential (primary) hypertension: Secondary | ICD-10-CM | POA: Diagnosis not present

## 2018-06-12 DIAGNOSIS — M810 Age-related osteoporosis without current pathological fracture: Secondary | ICD-10-CM | POA: Diagnosis not present

## 2018-06-12 DIAGNOSIS — R412 Retrograde amnesia: Secondary | ICD-10-CM | POA: Diagnosis not present

## 2018-06-12 DIAGNOSIS — F419 Anxiety disorder, unspecified: Secondary | ICD-10-CM | POA: Diagnosis not present

## 2018-06-13 DIAGNOSIS — F419 Anxiety disorder, unspecified: Secondary | ICD-10-CM | POA: Diagnosis not present

## 2018-06-13 DIAGNOSIS — I10 Essential (primary) hypertension: Secondary | ICD-10-CM | POA: Diagnosis not present

## 2018-06-13 DIAGNOSIS — R412 Retrograde amnesia: Secondary | ICD-10-CM | POA: Diagnosis not present

## 2018-06-13 DIAGNOSIS — M810 Age-related osteoporosis without current pathological fracture: Secondary | ICD-10-CM | POA: Diagnosis not present

## 2018-06-14 DIAGNOSIS — F419 Anxiety disorder, unspecified: Secondary | ICD-10-CM | POA: Diagnosis not present

## 2018-06-14 DIAGNOSIS — I10 Essential (primary) hypertension: Secondary | ICD-10-CM | POA: Diagnosis not present

## 2018-06-14 DIAGNOSIS — R412 Retrograde amnesia: Secondary | ICD-10-CM | POA: Diagnosis not present

## 2018-06-14 DIAGNOSIS — M810 Age-related osteoporosis without current pathological fracture: Secondary | ICD-10-CM | POA: Diagnosis not present

## 2018-06-15 DIAGNOSIS — R412 Retrograde amnesia: Secondary | ICD-10-CM | POA: Diagnosis not present

## 2018-06-15 DIAGNOSIS — M810 Age-related osteoporosis without current pathological fracture: Secondary | ICD-10-CM | POA: Diagnosis not present

## 2018-06-15 DIAGNOSIS — I10 Essential (primary) hypertension: Secondary | ICD-10-CM | POA: Diagnosis not present

## 2018-06-15 DIAGNOSIS — F419 Anxiety disorder, unspecified: Secondary | ICD-10-CM | POA: Diagnosis not present

## 2018-06-16 DIAGNOSIS — M810 Age-related osteoporosis without current pathological fracture: Secondary | ICD-10-CM | POA: Diagnosis not present

## 2018-06-16 DIAGNOSIS — I10 Essential (primary) hypertension: Secondary | ICD-10-CM | POA: Diagnosis not present

## 2018-06-16 DIAGNOSIS — R412 Retrograde amnesia: Secondary | ICD-10-CM | POA: Diagnosis not present

## 2018-06-16 DIAGNOSIS — F419 Anxiety disorder, unspecified: Secondary | ICD-10-CM | POA: Diagnosis not present

## 2018-06-17 DIAGNOSIS — R412 Retrograde amnesia: Secondary | ICD-10-CM | POA: Diagnosis not present

## 2018-06-17 DIAGNOSIS — I10 Essential (primary) hypertension: Secondary | ICD-10-CM | POA: Diagnosis not present

## 2018-06-17 DIAGNOSIS — F419 Anxiety disorder, unspecified: Secondary | ICD-10-CM | POA: Diagnosis not present

## 2018-06-17 DIAGNOSIS — M810 Age-related osteoporosis without current pathological fracture: Secondary | ICD-10-CM | POA: Diagnosis not present

## 2018-06-18 DIAGNOSIS — F419 Anxiety disorder, unspecified: Secondary | ICD-10-CM | POA: Diagnosis not present

## 2018-06-18 DIAGNOSIS — R412 Retrograde amnesia: Secondary | ICD-10-CM | POA: Diagnosis not present

## 2018-06-18 DIAGNOSIS — I10 Essential (primary) hypertension: Secondary | ICD-10-CM | POA: Diagnosis not present

## 2018-06-18 DIAGNOSIS — M810 Age-related osteoporosis without current pathological fracture: Secondary | ICD-10-CM | POA: Diagnosis not present

## 2018-06-19 DIAGNOSIS — R412 Retrograde amnesia: Secondary | ICD-10-CM | POA: Diagnosis not present

## 2018-06-19 DIAGNOSIS — F419 Anxiety disorder, unspecified: Secondary | ICD-10-CM | POA: Diagnosis not present

## 2018-06-19 DIAGNOSIS — M810 Age-related osteoporosis without current pathological fracture: Secondary | ICD-10-CM | POA: Diagnosis not present

## 2018-06-19 DIAGNOSIS — I10 Essential (primary) hypertension: Secondary | ICD-10-CM | POA: Diagnosis not present

## 2018-06-20 DIAGNOSIS — I10 Essential (primary) hypertension: Secondary | ICD-10-CM | POA: Diagnosis not present

## 2018-06-20 DIAGNOSIS — M810 Age-related osteoporosis without current pathological fracture: Secondary | ICD-10-CM | POA: Diagnosis not present

## 2018-06-20 DIAGNOSIS — F419 Anxiety disorder, unspecified: Secondary | ICD-10-CM | POA: Diagnosis not present

## 2018-06-20 DIAGNOSIS — R412 Retrograde amnesia: Secondary | ICD-10-CM | POA: Diagnosis not present

## 2018-06-21 DIAGNOSIS — F419 Anxiety disorder, unspecified: Secondary | ICD-10-CM | POA: Diagnosis not present

## 2018-06-21 DIAGNOSIS — R412 Retrograde amnesia: Secondary | ICD-10-CM | POA: Diagnosis not present

## 2018-06-21 DIAGNOSIS — I10 Essential (primary) hypertension: Secondary | ICD-10-CM | POA: Diagnosis not present

## 2018-06-21 DIAGNOSIS — M810 Age-related osteoporosis without current pathological fracture: Secondary | ICD-10-CM | POA: Diagnosis not present

## 2018-06-22 DIAGNOSIS — I10 Essential (primary) hypertension: Secondary | ICD-10-CM | POA: Diagnosis not present

## 2018-06-22 DIAGNOSIS — F419 Anxiety disorder, unspecified: Secondary | ICD-10-CM | POA: Diagnosis not present

## 2018-06-22 DIAGNOSIS — M810 Age-related osteoporosis without current pathological fracture: Secondary | ICD-10-CM | POA: Diagnosis not present

## 2018-06-22 DIAGNOSIS — R412 Retrograde amnesia: Secondary | ICD-10-CM | POA: Diagnosis not present

## 2018-06-23 DIAGNOSIS — R412 Retrograde amnesia: Secondary | ICD-10-CM | POA: Diagnosis not present

## 2018-06-23 DIAGNOSIS — I10 Essential (primary) hypertension: Secondary | ICD-10-CM | POA: Diagnosis not present

## 2018-06-23 DIAGNOSIS — M810 Age-related osteoporosis without current pathological fracture: Secondary | ICD-10-CM | POA: Diagnosis not present

## 2018-06-23 DIAGNOSIS — F419 Anxiety disorder, unspecified: Secondary | ICD-10-CM | POA: Diagnosis not present

## 2018-06-24 DIAGNOSIS — F419 Anxiety disorder, unspecified: Secondary | ICD-10-CM | POA: Diagnosis not present

## 2018-06-24 DIAGNOSIS — I10 Essential (primary) hypertension: Secondary | ICD-10-CM | POA: Diagnosis not present

## 2018-06-24 DIAGNOSIS — M810 Age-related osteoporosis without current pathological fracture: Secondary | ICD-10-CM | POA: Diagnosis not present

## 2018-06-24 DIAGNOSIS — R412 Retrograde amnesia: Secondary | ICD-10-CM | POA: Diagnosis not present

## 2018-06-25 DIAGNOSIS — F419 Anxiety disorder, unspecified: Secondary | ICD-10-CM | POA: Diagnosis not present

## 2018-06-25 DIAGNOSIS — R412 Retrograde amnesia: Secondary | ICD-10-CM | POA: Diagnosis not present

## 2018-06-25 DIAGNOSIS — I10 Essential (primary) hypertension: Secondary | ICD-10-CM | POA: Diagnosis not present

## 2018-06-25 DIAGNOSIS — M810 Age-related osteoporosis without current pathological fracture: Secondary | ICD-10-CM | POA: Diagnosis not present

## 2018-06-26 DIAGNOSIS — F419 Anxiety disorder, unspecified: Secondary | ICD-10-CM | POA: Diagnosis not present

## 2018-06-26 DIAGNOSIS — M810 Age-related osteoporosis without current pathological fracture: Secondary | ICD-10-CM | POA: Diagnosis not present

## 2018-06-26 DIAGNOSIS — I10 Essential (primary) hypertension: Secondary | ICD-10-CM | POA: Diagnosis not present

## 2018-06-26 DIAGNOSIS — R412 Retrograde amnesia: Secondary | ICD-10-CM | POA: Diagnosis not present

## 2018-06-27 DIAGNOSIS — F419 Anxiety disorder, unspecified: Secondary | ICD-10-CM | POA: Diagnosis not present

## 2018-06-27 DIAGNOSIS — M810 Age-related osteoporosis without current pathological fracture: Secondary | ICD-10-CM | POA: Diagnosis not present

## 2018-06-27 DIAGNOSIS — R412 Retrograde amnesia: Secondary | ICD-10-CM | POA: Diagnosis not present

## 2018-06-27 DIAGNOSIS — I10 Essential (primary) hypertension: Secondary | ICD-10-CM | POA: Diagnosis not present

## 2018-06-28 DIAGNOSIS — R412 Retrograde amnesia: Secondary | ICD-10-CM | POA: Diagnosis not present

## 2018-06-28 DIAGNOSIS — F419 Anxiety disorder, unspecified: Secondary | ICD-10-CM | POA: Diagnosis not present

## 2018-06-28 DIAGNOSIS — I10 Essential (primary) hypertension: Secondary | ICD-10-CM | POA: Diagnosis not present

## 2018-06-28 DIAGNOSIS — M810 Age-related osteoporosis without current pathological fracture: Secondary | ICD-10-CM | POA: Diagnosis not present

## 2018-06-29 DIAGNOSIS — R412 Retrograde amnesia: Secondary | ICD-10-CM | POA: Diagnosis not present

## 2018-06-29 DIAGNOSIS — F419 Anxiety disorder, unspecified: Secondary | ICD-10-CM | POA: Diagnosis not present

## 2018-06-29 DIAGNOSIS — M810 Age-related osteoporosis without current pathological fracture: Secondary | ICD-10-CM | POA: Diagnosis not present

## 2018-06-29 DIAGNOSIS — I10 Essential (primary) hypertension: Secondary | ICD-10-CM | POA: Diagnosis not present

## 2018-06-30 DIAGNOSIS — I10 Essential (primary) hypertension: Secondary | ICD-10-CM | POA: Diagnosis not present

## 2018-06-30 DIAGNOSIS — M810 Age-related osteoporosis without current pathological fracture: Secondary | ICD-10-CM | POA: Diagnosis not present

## 2018-06-30 DIAGNOSIS — F419 Anxiety disorder, unspecified: Secondary | ICD-10-CM | POA: Diagnosis not present

## 2018-06-30 DIAGNOSIS — R412 Retrograde amnesia: Secondary | ICD-10-CM | POA: Diagnosis not present

## 2018-07-01 DIAGNOSIS — F419 Anxiety disorder, unspecified: Secondary | ICD-10-CM | POA: Diagnosis not present

## 2018-07-01 DIAGNOSIS — R412 Retrograde amnesia: Secondary | ICD-10-CM | POA: Diagnosis not present

## 2018-07-01 DIAGNOSIS — M810 Age-related osteoporosis without current pathological fracture: Secondary | ICD-10-CM | POA: Diagnosis not present

## 2018-07-01 DIAGNOSIS — I10 Essential (primary) hypertension: Secondary | ICD-10-CM | POA: Diagnosis not present

## 2018-07-02 DIAGNOSIS — F419 Anxiety disorder, unspecified: Secondary | ICD-10-CM | POA: Diagnosis not present

## 2018-07-02 DIAGNOSIS — M810 Age-related osteoporosis without current pathological fracture: Secondary | ICD-10-CM | POA: Diagnosis not present

## 2018-07-02 DIAGNOSIS — I10 Essential (primary) hypertension: Secondary | ICD-10-CM | POA: Diagnosis not present

## 2018-07-02 DIAGNOSIS — R412 Retrograde amnesia: Secondary | ICD-10-CM | POA: Diagnosis not present

## 2018-07-03 DIAGNOSIS — I10 Essential (primary) hypertension: Secondary | ICD-10-CM | POA: Diagnosis not present

## 2018-07-03 DIAGNOSIS — F419 Anxiety disorder, unspecified: Secondary | ICD-10-CM | POA: Diagnosis not present

## 2018-07-03 DIAGNOSIS — R412 Retrograde amnesia: Secondary | ICD-10-CM | POA: Diagnosis not present

## 2018-07-03 DIAGNOSIS — M810 Age-related osteoporosis without current pathological fracture: Secondary | ICD-10-CM | POA: Diagnosis not present

## 2018-07-04 DIAGNOSIS — F419 Anxiety disorder, unspecified: Secondary | ICD-10-CM | POA: Diagnosis not present

## 2018-07-04 DIAGNOSIS — M810 Age-related osteoporosis without current pathological fracture: Secondary | ICD-10-CM | POA: Diagnosis not present

## 2018-07-04 DIAGNOSIS — R412 Retrograde amnesia: Secondary | ICD-10-CM | POA: Diagnosis not present

## 2018-07-04 DIAGNOSIS — I10 Essential (primary) hypertension: Secondary | ICD-10-CM | POA: Diagnosis not present

## 2018-07-05 DIAGNOSIS — F419 Anxiety disorder, unspecified: Secondary | ICD-10-CM | POA: Diagnosis not present

## 2018-07-05 DIAGNOSIS — R412 Retrograde amnesia: Secondary | ICD-10-CM | POA: Diagnosis not present

## 2018-07-05 DIAGNOSIS — I10 Essential (primary) hypertension: Secondary | ICD-10-CM | POA: Diagnosis not present

## 2018-07-05 DIAGNOSIS — M810 Age-related osteoporosis without current pathological fracture: Secondary | ICD-10-CM | POA: Diagnosis not present

## 2018-07-06 DIAGNOSIS — R412 Retrograde amnesia: Secondary | ICD-10-CM | POA: Diagnosis not present

## 2018-07-06 DIAGNOSIS — I10 Essential (primary) hypertension: Secondary | ICD-10-CM | POA: Diagnosis not present

## 2018-07-06 DIAGNOSIS — F419 Anxiety disorder, unspecified: Secondary | ICD-10-CM | POA: Diagnosis not present

## 2018-07-06 DIAGNOSIS — M810 Age-related osteoporosis without current pathological fracture: Secondary | ICD-10-CM | POA: Diagnosis not present

## 2018-07-07 DIAGNOSIS — R412 Retrograde amnesia: Secondary | ICD-10-CM | POA: Diagnosis not present

## 2018-07-07 DIAGNOSIS — I10 Essential (primary) hypertension: Secondary | ICD-10-CM | POA: Diagnosis not present

## 2018-07-07 DIAGNOSIS — M810 Age-related osteoporosis without current pathological fracture: Secondary | ICD-10-CM | POA: Diagnosis not present

## 2018-07-07 DIAGNOSIS — F419 Anxiety disorder, unspecified: Secondary | ICD-10-CM | POA: Diagnosis not present

## 2018-07-08 DIAGNOSIS — R412 Retrograde amnesia: Secondary | ICD-10-CM | POA: Diagnosis not present

## 2018-07-08 DIAGNOSIS — M810 Age-related osteoporosis without current pathological fracture: Secondary | ICD-10-CM | POA: Diagnosis not present

## 2018-07-08 DIAGNOSIS — I10 Essential (primary) hypertension: Secondary | ICD-10-CM | POA: Diagnosis not present

## 2018-07-08 DIAGNOSIS — F419 Anxiety disorder, unspecified: Secondary | ICD-10-CM | POA: Diagnosis not present

## 2018-07-09 DIAGNOSIS — M810 Age-related osteoporosis without current pathological fracture: Secondary | ICD-10-CM | POA: Diagnosis not present

## 2018-07-09 DIAGNOSIS — R412 Retrograde amnesia: Secondary | ICD-10-CM | POA: Diagnosis not present

## 2018-07-09 DIAGNOSIS — F419 Anxiety disorder, unspecified: Secondary | ICD-10-CM | POA: Diagnosis not present

## 2018-07-09 DIAGNOSIS — I10 Essential (primary) hypertension: Secondary | ICD-10-CM | POA: Diagnosis not present

## 2018-07-10 DIAGNOSIS — I10 Essential (primary) hypertension: Secondary | ICD-10-CM | POA: Diagnosis not present

## 2018-07-10 DIAGNOSIS — F419 Anxiety disorder, unspecified: Secondary | ICD-10-CM | POA: Diagnosis not present

## 2018-07-10 DIAGNOSIS — M810 Age-related osteoporosis without current pathological fracture: Secondary | ICD-10-CM | POA: Diagnosis not present

## 2018-07-10 DIAGNOSIS — R412 Retrograde amnesia: Secondary | ICD-10-CM | POA: Diagnosis not present

## 2018-07-11 DIAGNOSIS — R412 Retrograde amnesia: Secondary | ICD-10-CM | POA: Diagnosis not present

## 2018-07-11 DIAGNOSIS — I10 Essential (primary) hypertension: Secondary | ICD-10-CM | POA: Diagnosis not present

## 2018-07-11 DIAGNOSIS — M810 Age-related osteoporosis without current pathological fracture: Secondary | ICD-10-CM | POA: Diagnosis not present

## 2018-07-11 DIAGNOSIS — F419 Anxiety disorder, unspecified: Secondary | ICD-10-CM | POA: Diagnosis not present

## 2018-07-12 DIAGNOSIS — M810 Age-related osteoporosis without current pathological fracture: Secondary | ICD-10-CM | POA: Diagnosis not present

## 2018-07-12 DIAGNOSIS — I10 Essential (primary) hypertension: Secondary | ICD-10-CM | POA: Diagnosis not present

## 2018-07-12 DIAGNOSIS — F419 Anxiety disorder, unspecified: Secondary | ICD-10-CM | POA: Diagnosis not present

## 2018-07-12 DIAGNOSIS — R412 Retrograde amnesia: Secondary | ICD-10-CM | POA: Diagnosis not present

## 2018-07-13 DIAGNOSIS — R412 Retrograde amnesia: Secondary | ICD-10-CM | POA: Diagnosis not present

## 2018-07-13 DIAGNOSIS — F419 Anxiety disorder, unspecified: Secondary | ICD-10-CM | POA: Diagnosis not present

## 2018-07-13 DIAGNOSIS — I10 Essential (primary) hypertension: Secondary | ICD-10-CM | POA: Diagnosis not present

## 2018-07-13 DIAGNOSIS — M810 Age-related osteoporosis without current pathological fracture: Secondary | ICD-10-CM | POA: Diagnosis not present

## 2018-07-14 DIAGNOSIS — R412 Retrograde amnesia: Secondary | ICD-10-CM | POA: Diagnosis not present

## 2018-07-14 DIAGNOSIS — F419 Anxiety disorder, unspecified: Secondary | ICD-10-CM | POA: Diagnosis not present

## 2018-07-14 DIAGNOSIS — I10 Essential (primary) hypertension: Secondary | ICD-10-CM | POA: Diagnosis not present

## 2018-07-14 DIAGNOSIS — M810 Age-related osteoporosis without current pathological fracture: Secondary | ICD-10-CM | POA: Diagnosis not present

## 2018-07-15 DIAGNOSIS — R412 Retrograde amnesia: Secondary | ICD-10-CM | POA: Diagnosis not present

## 2018-07-15 DIAGNOSIS — F419 Anxiety disorder, unspecified: Secondary | ICD-10-CM | POA: Diagnosis not present

## 2018-07-15 DIAGNOSIS — I10 Essential (primary) hypertension: Secondary | ICD-10-CM | POA: Diagnosis not present

## 2018-07-15 DIAGNOSIS — M810 Age-related osteoporosis without current pathological fracture: Secondary | ICD-10-CM | POA: Diagnosis not present

## 2018-07-16 DIAGNOSIS — M810 Age-related osteoporosis without current pathological fracture: Secondary | ICD-10-CM | POA: Diagnosis not present

## 2018-07-16 DIAGNOSIS — I10 Essential (primary) hypertension: Secondary | ICD-10-CM | POA: Diagnosis not present

## 2018-07-16 DIAGNOSIS — F419 Anxiety disorder, unspecified: Secondary | ICD-10-CM | POA: Diagnosis not present

## 2018-07-16 DIAGNOSIS — R412 Retrograde amnesia: Secondary | ICD-10-CM | POA: Diagnosis not present

## 2018-07-17 DIAGNOSIS — I10 Essential (primary) hypertension: Secondary | ICD-10-CM | POA: Diagnosis not present

## 2018-07-17 DIAGNOSIS — M810 Age-related osteoporosis without current pathological fracture: Secondary | ICD-10-CM | POA: Diagnosis not present

## 2018-07-17 DIAGNOSIS — R412 Retrograde amnesia: Secondary | ICD-10-CM | POA: Diagnosis not present

## 2018-07-17 DIAGNOSIS — F419 Anxiety disorder, unspecified: Secondary | ICD-10-CM | POA: Diagnosis not present

## 2018-07-18 DIAGNOSIS — M810 Age-related osteoporosis without current pathological fracture: Secondary | ICD-10-CM | POA: Diagnosis not present

## 2018-07-18 DIAGNOSIS — I10 Essential (primary) hypertension: Secondary | ICD-10-CM | POA: Diagnosis not present

## 2018-07-18 DIAGNOSIS — F419 Anxiety disorder, unspecified: Secondary | ICD-10-CM | POA: Diagnosis not present

## 2018-07-18 DIAGNOSIS — R412 Retrograde amnesia: Secondary | ICD-10-CM | POA: Diagnosis not present

## 2018-07-19 DIAGNOSIS — I10 Essential (primary) hypertension: Secondary | ICD-10-CM | POA: Diagnosis not present

## 2018-07-19 DIAGNOSIS — M810 Age-related osteoporosis without current pathological fracture: Secondary | ICD-10-CM | POA: Diagnosis not present

## 2018-07-19 DIAGNOSIS — F419 Anxiety disorder, unspecified: Secondary | ICD-10-CM | POA: Diagnosis not present

## 2018-07-19 DIAGNOSIS — R412 Retrograde amnesia: Secondary | ICD-10-CM | POA: Diagnosis not present

## 2018-07-20 DIAGNOSIS — F419 Anxiety disorder, unspecified: Secondary | ICD-10-CM | POA: Diagnosis not present

## 2018-07-20 DIAGNOSIS — M810 Age-related osteoporosis without current pathological fracture: Secondary | ICD-10-CM | POA: Diagnosis not present

## 2018-07-20 DIAGNOSIS — R412 Retrograde amnesia: Secondary | ICD-10-CM | POA: Diagnosis not present

## 2018-07-20 DIAGNOSIS — I10 Essential (primary) hypertension: Secondary | ICD-10-CM | POA: Diagnosis not present

## 2018-07-21 DIAGNOSIS — F419 Anxiety disorder, unspecified: Secondary | ICD-10-CM | POA: Diagnosis not present

## 2018-07-21 DIAGNOSIS — M810 Age-related osteoporosis without current pathological fracture: Secondary | ICD-10-CM | POA: Diagnosis not present

## 2018-07-21 DIAGNOSIS — R412 Retrograde amnesia: Secondary | ICD-10-CM | POA: Diagnosis not present

## 2018-07-21 DIAGNOSIS — I10 Essential (primary) hypertension: Secondary | ICD-10-CM | POA: Diagnosis not present

## 2018-07-22 DIAGNOSIS — I10 Essential (primary) hypertension: Secondary | ICD-10-CM | POA: Diagnosis not present

## 2018-07-22 DIAGNOSIS — M810 Age-related osteoporosis without current pathological fracture: Secondary | ICD-10-CM | POA: Diagnosis not present

## 2018-07-22 DIAGNOSIS — R412 Retrograde amnesia: Secondary | ICD-10-CM | POA: Diagnosis not present

## 2018-07-22 DIAGNOSIS — F419 Anxiety disorder, unspecified: Secondary | ICD-10-CM | POA: Diagnosis not present

## 2018-07-23 DIAGNOSIS — R412 Retrograde amnesia: Secondary | ICD-10-CM | POA: Diagnosis not present

## 2018-07-23 DIAGNOSIS — F419 Anxiety disorder, unspecified: Secondary | ICD-10-CM | POA: Diagnosis not present

## 2018-07-23 DIAGNOSIS — M810 Age-related osteoporosis without current pathological fracture: Secondary | ICD-10-CM | POA: Diagnosis not present

## 2018-07-23 DIAGNOSIS — I10 Essential (primary) hypertension: Secondary | ICD-10-CM | POA: Diagnosis not present

## 2018-07-24 DIAGNOSIS — R412 Retrograde amnesia: Secondary | ICD-10-CM | POA: Diagnosis not present

## 2018-07-24 DIAGNOSIS — M810 Age-related osteoporosis without current pathological fracture: Secondary | ICD-10-CM | POA: Diagnosis not present

## 2018-07-24 DIAGNOSIS — I10 Essential (primary) hypertension: Secondary | ICD-10-CM | POA: Diagnosis not present

## 2018-07-24 DIAGNOSIS — F419 Anxiety disorder, unspecified: Secondary | ICD-10-CM | POA: Diagnosis not present

## 2018-07-25 DIAGNOSIS — M810 Age-related osteoporosis without current pathological fracture: Secondary | ICD-10-CM | POA: Diagnosis not present

## 2018-07-25 DIAGNOSIS — R412 Retrograde amnesia: Secondary | ICD-10-CM | POA: Diagnosis not present

## 2018-07-25 DIAGNOSIS — I10 Essential (primary) hypertension: Secondary | ICD-10-CM | POA: Diagnosis not present

## 2018-07-25 DIAGNOSIS — F419 Anxiety disorder, unspecified: Secondary | ICD-10-CM | POA: Diagnosis not present

## 2018-07-26 DIAGNOSIS — R412 Retrograde amnesia: Secondary | ICD-10-CM | POA: Diagnosis not present

## 2018-07-26 DIAGNOSIS — M810 Age-related osteoporosis without current pathological fracture: Secondary | ICD-10-CM | POA: Diagnosis not present

## 2018-07-26 DIAGNOSIS — I10 Essential (primary) hypertension: Secondary | ICD-10-CM | POA: Diagnosis not present

## 2018-07-26 DIAGNOSIS — F419 Anxiety disorder, unspecified: Secondary | ICD-10-CM | POA: Diagnosis not present

## 2018-07-27 DIAGNOSIS — R412 Retrograde amnesia: Secondary | ICD-10-CM | POA: Diagnosis not present

## 2018-07-27 DIAGNOSIS — F419 Anxiety disorder, unspecified: Secondary | ICD-10-CM | POA: Diagnosis not present

## 2018-07-27 DIAGNOSIS — M810 Age-related osteoporosis without current pathological fracture: Secondary | ICD-10-CM | POA: Diagnosis not present

## 2018-07-27 DIAGNOSIS — M545 Low back pain: Secondary | ICD-10-CM | POA: Diagnosis not present

## 2018-07-27 DIAGNOSIS — Z6828 Body mass index (BMI) 28.0-28.9, adult: Secondary | ICD-10-CM | POA: Diagnosis not present

## 2018-07-27 DIAGNOSIS — I10 Essential (primary) hypertension: Secondary | ICD-10-CM | POA: Diagnosis not present

## 2018-07-27 DIAGNOSIS — K210 Gastro-esophageal reflux disease with esophagitis: Secondary | ICD-10-CM | POA: Diagnosis not present

## 2018-07-28 DIAGNOSIS — R412 Retrograde amnesia: Secondary | ICD-10-CM | POA: Diagnosis not present

## 2018-07-28 DIAGNOSIS — F419 Anxiety disorder, unspecified: Secondary | ICD-10-CM | POA: Diagnosis not present

## 2018-07-28 DIAGNOSIS — I10 Essential (primary) hypertension: Secondary | ICD-10-CM | POA: Diagnosis not present

## 2018-07-28 DIAGNOSIS — M810 Age-related osteoporosis without current pathological fracture: Secondary | ICD-10-CM | POA: Diagnosis not present

## 2018-07-29 DIAGNOSIS — R412 Retrograde amnesia: Secondary | ICD-10-CM | POA: Diagnosis not present

## 2018-07-29 DIAGNOSIS — M810 Age-related osteoporosis without current pathological fracture: Secondary | ICD-10-CM | POA: Diagnosis not present

## 2018-07-29 DIAGNOSIS — F419 Anxiety disorder, unspecified: Secondary | ICD-10-CM | POA: Diagnosis not present

## 2018-07-29 DIAGNOSIS — I10 Essential (primary) hypertension: Secondary | ICD-10-CM | POA: Diagnosis not present

## 2018-07-30 DIAGNOSIS — I10 Essential (primary) hypertension: Secondary | ICD-10-CM | POA: Diagnosis not present

## 2018-07-30 DIAGNOSIS — F419 Anxiety disorder, unspecified: Secondary | ICD-10-CM | POA: Diagnosis not present

## 2018-07-30 DIAGNOSIS — R412 Retrograde amnesia: Secondary | ICD-10-CM | POA: Diagnosis not present

## 2018-07-30 DIAGNOSIS — M810 Age-related osteoporosis without current pathological fracture: Secondary | ICD-10-CM | POA: Diagnosis not present

## 2018-07-31 DIAGNOSIS — I10 Essential (primary) hypertension: Secondary | ICD-10-CM | POA: Diagnosis not present

## 2018-07-31 DIAGNOSIS — R412 Retrograde amnesia: Secondary | ICD-10-CM | POA: Diagnosis not present

## 2018-07-31 DIAGNOSIS — M810 Age-related osteoporosis without current pathological fracture: Secondary | ICD-10-CM | POA: Diagnosis not present

## 2018-07-31 DIAGNOSIS — F419 Anxiety disorder, unspecified: Secondary | ICD-10-CM | POA: Diagnosis not present

## 2018-08-01 DIAGNOSIS — M810 Age-related osteoporosis without current pathological fracture: Secondary | ICD-10-CM | POA: Diagnosis not present

## 2018-08-01 DIAGNOSIS — I10 Essential (primary) hypertension: Secondary | ICD-10-CM | POA: Diagnosis not present

## 2018-08-01 DIAGNOSIS — F419 Anxiety disorder, unspecified: Secondary | ICD-10-CM | POA: Diagnosis not present

## 2018-08-01 DIAGNOSIS — R412 Retrograde amnesia: Secondary | ICD-10-CM | POA: Diagnosis not present

## 2018-08-02 DIAGNOSIS — M810 Age-related osteoporosis without current pathological fracture: Secondary | ICD-10-CM | POA: Diagnosis not present

## 2018-08-02 DIAGNOSIS — R412 Retrograde amnesia: Secondary | ICD-10-CM | POA: Diagnosis not present

## 2018-08-02 DIAGNOSIS — I10 Essential (primary) hypertension: Secondary | ICD-10-CM | POA: Diagnosis not present

## 2018-08-02 DIAGNOSIS — F419 Anxiety disorder, unspecified: Secondary | ICD-10-CM | POA: Diagnosis not present

## 2018-08-03 DIAGNOSIS — F419 Anxiety disorder, unspecified: Secondary | ICD-10-CM | POA: Diagnosis not present

## 2018-08-03 DIAGNOSIS — I10 Essential (primary) hypertension: Secondary | ICD-10-CM | POA: Diagnosis not present

## 2018-08-03 DIAGNOSIS — R412 Retrograde amnesia: Secondary | ICD-10-CM | POA: Diagnosis not present

## 2018-08-03 DIAGNOSIS — M810 Age-related osteoporosis without current pathological fracture: Secondary | ICD-10-CM | POA: Diagnosis not present

## 2018-08-04 DIAGNOSIS — R412 Retrograde amnesia: Secondary | ICD-10-CM | POA: Diagnosis not present

## 2018-08-04 DIAGNOSIS — F419 Anxiety disorder, unspecified: Secondary | ICD-10-CM | POA: Diagnosis not present

## 2018-08-04 DIAGNOSIS — I10 Essential (primary) hypertension: Secondary | ICD-10-CM | POA: Diagnosis not present

## 2018-08-04 DIAGNOSIS — M810 Age-related osteoporosis without current pathological fracture: Secondary | ICD-10-CM | POA: Diagnosis not present

## 2018-08-05 DIAGNOSIS — M810 Age-related osteoporosis without current pathological fracture: Secondary | ICD-10-CM | POA: Diagnosis not present

## 2018-08-05 DIAGNOSIS — F419 Anxiety disorder, unspecified: Secondary | ICD-10-CM | POA: Diagnosis not present

## 2018-08-05 DIAGNOSIS — I10 Essential (primary) hypertension: Secondary | ICD-10-CM | POA: Diagnosis not present

## 2018-08-05 DIAGNOSIS — R412 Retrograde amnesia: Secondary | ICD-10-CM | POA: Diagnosis not present

## 2018-08-06 DIAGNOSIS — M810 Age-related osteoporosis without current pathological fracture: Secondary | ICD-10-CM | POA: Diagnosis not present

## 2018-08-06 DIAGNOSIS — R412 Retrograde amnesia: Secondary | ICD-10-CM | POA: Diagnosis not present

## 2018-08-06 DIAGNOSIS — I10 Essential (primary) hypertension: Secondary | ICD-10-CM | POA: Diagnosis not present

## 2018-08-06 DIAGNOSIS — F419 Anxiety disorder, unspecified: Secondary | ICD-10-CM | POA: Diagnosis not present

## 2018-08-07 DIAGNOSIS — M810 Age-related osteoporosis without current pathological fracture: Secondary | ICD-10-CM | POA: Diagnosis not present

## 2018-08-07 DIAGNOSIS — I10 Essential (primary) hypertension: Secondary | ICD-10-CM | POA: Diagnosis not present

## 2018-08-07 DIAGNOSIS — F419 Anxiety disorder, unspecified: Secondary | ICD-10-CM | POA: Diagnosis not present

## 2018-08-07 DIAGNOSIS — R412 Retrograde amnesia: Secondary | ICD-10-CM | POA: Diagnosis not present

## 2018-08-08 DIAGNOSIS — M810 Age-related osteoporosis without current pathological fracture: Secondary | ICD-10-CM | POA: Diagnosis not present

## 2018-08-08 DIAGNOSIS — R412 Retrograde amnesia: Secondary | ICD-10-CM | POA: Diagnosis not present

## 2018-08-08 DIAGNOSIS — I10 Essential (primary) hypertension: Secondary | ICD-10-CM | POA: Diagnosis not present

## 2018-08-08 DIAGNOSIS — F419 Anxiety disorder, unspecified: Secondary | ICD-10-CM | POA: Diagnosis not present

## 2018-08-09 DIAGNOSIS — M810 Age-related osteoporosis without current pathological fracture: Secondary | ICD-10-CM | POA: Diagnosis not present

## 2018-08-09 DIAGNOSIS — R412 Retrograde amnesia: Secondary | ICD-10-CM | POA: Diagnosis not present

## 2018-08-09 DIAGNOSIS — F419 Anxiety disorder, unspecified: Secondary | ICD-10-CM | POA: Diagnosis not present

## 2018-08-09 DIAGNOSIS — I10 Essential (primary) hypertension: Secondary | ICD-10-CM | POA: Diagnosis not present

## 2018-08-10 DIAGNOSIS — R412 Retrograde amnesia: Secondary | ICD-10-CM | POA: Diagnosis not present

## 2018-08-10 DIAGNOSIS — F419 Anxiety disorder, unspecified: Secondary | ICD-10-CM | POA: Diagnosis not present

## 2018-08-10 DIAGNOSIS — M810 Age-related osteoporosis without current pathological fracture: Secondary | ICD-10-CM | POA: Diagnosis not present

## 2018-08-10 DIAGNOSIS — I10 Essential (primary) hypertension: Secondary | ICD-10-CM | POA: Diagnosis not present

## 2018-08-11 DIAGNOSIS — R412 Retrograde amnesia: Secondary | ICD-10-CM | POA: Diagnosis not present

## 2018-08-11 DIAGNOSIS — F419 Anxiety disorder, unspecified: Secondary | ICD-10-CM | POA: Diagnosis not present

## 2018-08-11 DIAGNOSIS — M810 Age-related osteoporosis without current pathological fracture: Secondary | ICD-10-CM | POA: Diagnosis not present

## 2018-08-11 DIAGNOSIS — I10 Essential (primary) hypertension: Secondary | ICD-10-CM | POA: Diagnosis not present

## 2018-08-12 DIAGNOSIS — I10 Essential (primary) hypertension: Secondary | ICD-10-CM | POA: Diagnosis not present

## 2018-08-12 DIAGNOSIS — R412 Retrograde amnesia: Secondary | ICD-10-CM | POA: Diagnosis not present

## 2018-08-12 DIAGNOSIS — F419 Anxiety disorder, unspecified: Secondary | ICD-10-CM | POA: Diagnosis not present

## 2018-08-12 DIAGNOSIS — M810 Age-related osteoporosis without current pathological fracture: Secondary | ICD-10-CM | POA: Diagnosis not present

## 2018-08-13 DIAGNOSIS — F419 Anxiety disorder, unspecified: Secondary | ICD-10-CM | POA: Diagnosis not present

## 2018-08-13 DIAGNOSIS — R412 Retrograde amnesia: Secondary | ICD-10-CM | POA: Diagnosis not present

## 2018-08-13 DIAGNOSIS — I10 Essential (primary) hypertension: Secondary | ICD-10-CM | POA: Diagnosis not present

## 2018-08-13 DIAGNOSIS — M810 Age-related osteoporosis without current pathological fracture: Secondary | ICD-10-CM | POA: Diagnosis not present

## 2018-08-14 DIAGNOSIS — R412 Retrograde amnesia: Secondary | ICD-10-CM | POA: Diagnosis not present

## 2018-08-14 DIAGNOSIS — F419 Anxiety disorder, unspecified: Secondary | ICD-10-CM | POA: Diagnosis not present

## 2018-08-14 DIAGNOSIS — M810 Age-related osteoporosis without current pathological fracture: Secondary | ICD-10-CM | POA: Diagnosis not present

## 2018-08-14 DIAGNOSIS — I10 Essential (primary) hypertension: Secondary | ICD-10-CM | POA: Diagnosis not present

## 2018-08-15 DIAGNOSIS — M810 Age-related osteoporosis without current pathological fracture: Secondary | ICD-10-CM | POA: Diagnosis not present

## 2018-08-15 DIAGNOSIS — R412 Retrograde amnesia: Secondary | ICD-10-CM | POA: Diagnosis not present

## 2018-08-15 DIAGNOSIS — I10 Essential (primary) hypertension: Secondary | ICD-10-CM | POA: Diagnosis not present

## 2018-08-15 DIAGNOSIS — F419 Anxiety disorder, unspecified: Secondary | ICD-10-CM | POA: Diagnosis not present

## 2018-08-16 DIAGNOSIS — F419 Anxiety disorder, unspecified: Secondary | ICD-10-CM | POA: Diagnosis not present

## 2018-08-16 DIAGNOSIS — R412 Retrograde amnesia: Secondary | ICD-10-CM | POA: Diagnosis not present

## 2018-08-16 DIAGNOSIS — I10 Essential (primary) hypertension: Secondary | ICD-10-CM | POA: Diagnosis not present

## 2018-08-16 DIAGNOSIS — M810 Age-related osteoporosis without current pathological fracture: Secondary | ICD-10-CM | POA: Diagnosis not present

## 2018-08-17 DIAGNOSIS — I10 Essential (primary) hypertension: Secondary | ICD-10-CM | POA: Diagnosis not present

## 2018-08-17 DIAGNOSIS — R412 Retrograde amnesia: Secondary | ICD-10-CM | POA: Diagnosis not present

## 2018-08-17 DIAGNOSIS — M810 Age-related osteoporosis without current pathological fracture: Secondary | ICD-10-CM | POA: Diagnosis not present

## 2018-08-17 DIAGNOSIS — F419 Anxiety disorder, unspecified: Secondary | ICD-10-CM | POA: Diagnosis not present

## 2018-08-18 DIAGNOSIS — I10 Essential (primary) hypertension: Secondary | ICD-10-CM | POA: Diagnosis not present

## 2018-08-18 DIAGNOSIS — R412 Retrograde amnesia: Secondary | ICD-10-CM | POA: Diagnosis not present

## 2018-08-18 DIAGNOSIS — M810 Age-related osteoporosis without current pathological fracture: Secondary | ICD-10-CM | POA: Diagnosis not present

## 2018-08-18 DIAGNOSIS — F419 Anxiety disorder, unspecified: Secondary | ICD-10-CM | POA: Diagnosis not present

## 2018-08-19 DIAGNOSIS — F419 Anxiety disorder, unspecified: Secondary | ICD-10-CM | POA: Diagnosis not present

## 2018-08-19 DIAGNOSIS — I10 Essential (primary) hypertension: Secondary | ICD-10-CM | POA: Diagnosis not present

## 2018-08-19 DIAGNOSIS — M810 Age-related osteoporosis without current pathological fracture: Secondary | ICD-10-CM | POA: Diagnosis not present

## 2018-08-19 DIAGNOSIS — R412 Retrograde amnesia: Secondary | ICD-10-CM | POA: Diagnosis not present

## 2018-08-20 DIAGNOSIS — M810 Age-related osteoporosis without current pathological fracture: Secondary | ICD-10-CM | POA: Diagnosis not present

## 2018-08-20 DIAGNOSIS — F419 Anxiety disorder, unspecified: Secondary | ICD-10-CM | POA: Diagnosis not present

## 2018-08-20 DIAGNOSIS — I10 Essential (primary) hypertension: Secondary | ICD-10-CM | POA: Diagnosis not present

## 2018-08-20 DIAGNOSIS — R412 Retrograde amnesia: Secondary | ICD-10-CM | POA: Diagnosis not present

## 2018-08-21 DIAGNOSIS — R412 Retrograde amnesia: Secondary | ICD-10-CM | POA: Diagnosis not present

## 2018-08-21 DIAGNOSIS — F419 Anxiety disorder, unspecified: Secondary | ICD-10-CM | POA: Diagnosis not present

## 2018-08-21 DIAGNOSIS — M810 Age-related osteoporosis without current pathological fracture: Secondary | ICD-10-CM | POA: Diagnosis not present

## 2018-08-21 DIAGNOSIS — I10 Essential (primary) hypertension: Secondary | ICD-10-CM | POA: Diagnosis not present

## 2018-08-22 DIAGNOSIS — F419 Anxiety disorder, unspecified: Secondary | ICD-10-CM | POA: Diagnosis not present

## 2018-08-22 DIAGNOSIS — R412 Retrograde amnesia: Secondary | ICD-10-CM | POA: Diagnosis not present

## 2018-08-22 DIAGNOSIS — I10 Essential (primary) hypertension: Secondary | ICD-10-CM | POA: Diagnosis not present

## 2018-08-22 DIAGNOSIS — M810 Age-related osteoporosis without current pathological fracture: Secondary | ICD-10-CM | POA: Diagnosis not present

## 2018-08-23 DIAGNOSIS — F419 Anxiety disorder, unspecified: Secondary | ICD-10-CM | POA: Diagnosis not present

## 2018-08-23 DIAGNOSIS — M810 Age-related osteoporosis without current pathological fracture: Secondary | ICD-10-CM | POA: Diagnosis not present

## 2018-08-23 DIAGNOSIS — R412 Retrograde amnesia: Secondary | ICD-10-CM | POA: Diagnosis not present

## 2018-08-23 DIAGNOSIS — I10 Essential (primary) hypertension: Secondary | ICD-10-CM | POA: Diagnosis not present

## 2018-08-24 DIAGNOSIS — I10 Essential (primary) hypertension: Secondary | ICD-10-CM | POA: Diagnosis not present

## 2018-08-24 DIAGNOSIS — M810 Age-related osteoporosis without current pathological fracture: Secondary | ICD-10-CM | POA: Diagnosis not present

## 2018-08-24 DIAGNOSIS — F419 Anxiety disorder, unspecified: Secondary | ICD-10-CM | POA: Diagnosis not present

## 2018-08-24 DIAGNOSIS — R412 Retrograde amnesia: Secondary | ICD-10-CM | POA: Diagnosis not present

## 2018-08-25 DIAGNOSIS — F419 Anxiety disorder, unspecified: Secondary | ICD-10-CM | POA: Diagnosis not present

## 2018-08-25 DIAGNOSIS — R412 Retrograde amnesia: Secondary | ICD-10-CM | POA: Diagnosis not present

## 2018-08-25 DIAGNOSIS — I10 Essential (primary) hypertension: Secondary | ICD-10-CM | POA: Diagnosis not present

## 2018-08-25 DIAGNOSIS — M810 Age-related osteoporosis without current pathological fracture: Secondary | ICD-10-CM | POA: Diagnosis not present

## 2018-08-26 DIAGNOSIS — I10 Essential (primary) hypertension: Secondary | ICD-10-CM | POA: Diagnosis not present

## 2018-08-26 DIAGNOSIS — M810 Age-related osteoporosis without current pathological fracture: Secondary | ICD-10-CM | POA: Diagnosis not present

## 2018-08-26 DIAGNOSIS — F419 Anxiety disorder, unspecified: Secondary | ICD-10-CM | POA: Diagnosis not present

## 2018-08-26 DIAGNOSIS — R412 Retrograde amnesia: Secondary | ICD-10-CM | POA: Diagnosis not present

## 2018-08-27 DIAGNOSIS — F419 Anxiety disorder, unspecified: Secondary | ICD-10-CM | POA: Diagnosis not present

## 2018-08-27 DIAGNOSIS — I10 Essential (primary) hypertension: Secondary | ICD-10-CM | POA: Diagnosis not present

## 2018-08-27 DIAGNOSIS — M810 Age-related osteoporosis without current pathological fracture: Secondary | ICD-10-CM | POA: Diagnosis not present

## 2018-08-27 DIAGNOSIS — R412 Retrograde amnesia: Secondary | ICD-10-CM | POA: Diagnosis not present

## 2018-08-28 DIAGNOSIS — R412 Retrograde amnesia: Secondary | ICD-10-CM | POA: Diagnosis not present

## 2018-08-28 DIAGNOSIS — I10 Essential (primary) hypertension: Secondary | ICD-10-CM | POA: Diagnosis not present

## 2018-08-28 DIAGNOSIS — F419 Anxiety disorder, unspecified: Secondary | ICD-10-CM | POA: Diagnosis not present

## 2018-08-28 DIAGNOSIS — M810 Age-related osteoporosis without current pathological fracture: Secondary | ICD-10-CM | POA: Diagnosis not present

## 2018-08-29 DIAGNOSIS — I10 Essential (primary) hypertension: Secondary | ICD-10-CM | POA: Diagnosis not present

## 2018-08-29 DIAGNOSIS — R412 Retrograde amnesia: Secondary | ICD-10-CM | POA: Diagnosis not present

## 2018-08-29 DIAGNOSIS — F419 Anxiety disorder, unspecified: Secondary | ICD-10-CM | POA: Diagnosis not present

## 2018-08-29 DIAGNOSIS — M810 Age-related osteoporosis without current pathological fracture: Secondary | ICD-10-CM | POA: Diagnosis not present

## 2018-08-30 DIAGNOSIS — M810 Age-related osteoporosis without current pathological fracture: Secondary | ICD-10-CM | POA: Diagnosis not present

## 2018-08-30 DIAGNOSIS — F419 Anxiety disorder, unspecified: Secondary | ICD-10-CM | POA: Diagnosis not present

## 2018-08-30 DIAGNOSIS — R412 Retrograde amnesia: Secondary | ICD-10-CM | POA: Diagnosis not present

## 2018-08-30 DIAGNOSIS — I10 Essential (primary) hypertension: Secondary | ICD-10-CM | POA: Diagnosis not present

## 2018-08-31 DIAGNOSIS — I10 Essential (primary) hypertension: Secondary | ICD-10-CM | POA: Diagnosis not present

## 2018-08-31 DIAGNOSIS — M810 Age-related osteoporosis without current pathological fracture: Secondary | ICD-10-CM | POA: Diagnosis not present

## 2018-08-31 DIAGNOSIS — R412 Retrograde amnesia: Secondary | ICD-10-CM | POA: Diagnosis not present

## 2018-08-31 DIAGNOSIS — F419 Anxiety disorder, unspecified: Secondary | ICD-10-CM | POA: Diagnosis not present

## 2018-09-01 DIAGNOSIS — R412 Retrograde amnesia: Secondary | ICD-10-CM | POA: Diagnosis not present

## 2018-09-01 DIAGNOSIS — M810 Age-related osteoporosis without current pathological fracture: Secondary | ICD-10-CM | POA: Diagnosis not present

## 2018-09-01 DIAGNOSIS — I10 Essential (primary) hypertension: Secondary | ICD-10-CM | POA: Diagnosis not present

## 2018-09-01 DIAGNOSIS — F419 Anxiety disorder, unspecified: Secondary | ICD-10-CM | POA: Diagnosis not present

## 2018-09-02 DIAGNOSIS — F419 Anxiety disorder, unspecified: Secondary | ICD-10-CM | POA: Diagnosis not present

## 2018-09-02 DIAGNOSIS — M810 Age-related osteoporosis without current pathological fracture: Secondary | ICD-10-CM | POA: Diagnosis not present

## 2018-09-02 DIAGNOSIS — I10 Essential (primary) hypertension: Secondary | ICD-10-CM | POA: Diagnosis not present

## 2018-09-02 DIAGNOSIS — R412 Retrograde amnesia: Secondary | ICD-10-CM | POA: Diagnosis not present

## 2018-09-03 DIAGNOSIS — M810 Age-related osteoporosis without current pathological fracture: Secondary | ICD-10-CM | POA: Diagnosis not present

## 2018-09-03 DIAGNOSIS — F419 Anxiety disorder, unspecified: Secondary | ICD-10-CM | POA: Diagnosis not present

## 2018-09-03 DIAGNOSIS — I10 Essential (primary) hypertension: Secondary | ICD-10-CM | POA: Diagnosis not present

## 2018-09-03 DIAGNOSIS — R412 Retrograde amnesia: Secondary | ICD-10-CM | POA: Diagnosis not present

## 2018-09-04 DIAGNOSIS — F419 Anxiety disorder, unspecified: Secondary | ICD-10-CM | POA: Diagnosis not present

## 2018-09-04 DIAGNOSIS — I10 Essential (primary) hypertension: Secondary | ICD-10-CM | POA: Diagnosis not present

## 2018-09-04 DIAGNOSIS — M810 Age-related osteoporosis without current pathological fracture: Secondary | ICD-10-CM | POA: Diagnosis not present

## 2018-09-04 DIAGNOSIS — R412 Retrograde amnesia: Secondary | ICD-10-CM | POA: Diagnosis not present

## 2018-09-05 DIAGNOSIS — I10 Essential (primary) hypertension: Secondary | ICD-10-CM | POA: Diagnosis not present

## 2018-09-05 DIAGNOSIS — F419 Anxiety disorder, unspecified: Secondary | ICD-10-CM | POA: Diagnosis not present

## 2018-09-05 DIAGNOSIS — M810 Age-related osteoporosis without current pathological fracture: Secondary | ICD-10-CM | POA: Diagnosis not present

## 2018-09-05 DIAGNOSIS — R412 Retrograde amnesia: Secondary | ICD-10-CM | POA: Diagnosis not present

## 2018-09-06 DIAGNOSIS — I10 Essential (primary) hypertension: Secondary | ICD-10-CM | POA: Diagnosis not present

## 2018-09-06 DIAGNOSIS — F419 Anxiety disorder, unspecified: Secondary | ICD-10-CM | POA: Diagnosis not present

## 2018-09-06 DIAGNOSIS — R412 Retrograde amnesia: Secondary | ICD-10-CM | POA: Diagnosis not present

## 2018-09-06 DIAGNOSIS — M810 Age-related osteoporosis without current pathological fracture: Secondary | ICD-10-CM | POA: Diagnosis not present

## 2018-09-07 DIAGNOSIS — F419 Anxiety disorder, unspecified: Secondary | ICD-10-CM | POA: Diagnosis not present

## 2018-09-07 DIAGNOSIS — R412 Retrograde amnesia: Secondary | ICD-10-CM | POA: Diagnosis not present

## 2018-09-07 DIAGNOSIS — M810 Age-related osteoporosis without current pathological fracture: Secondary | ICD-10-CM | POA: Diagnosis not present

## 2018-09-07 DIAGNOSIS — I10 Essential (primary) hypertension: Secondary | ICD-10-CM | POA: Diagnosis not present

## 2018-09-08 DIAGNOSIS — R412 Retrograde amnesia: Secondary | ICD-10-CM | POA: Diagnosis not present

## 2018-09-08 DIAGNOSIS — F419 Anxiety disorder, unspecified: Secondary | ICD-10-CM | POA: Diagnosis not present

## 2018-09-08 DIAGNOSIS — M810 Age-related osteoporosis without current pathological fracture: Secondary | ICD-10-CM | POA: Diagnosis not present

## 2018-09-08 DIAGNOSIS — I10 Essential (primary) hypertension: Secondary | ICD-10-CM | POA: Diagnosis not present

## 2018-09-09 DIAGNOSIS — I10 Essential (primary) hypertension: Secondary | ICD-10-CM | POA: Diagnosis not present

## 2018-09-09 DIAGNOSIS — F419 Anxiety disorder, unspecified: Secondary | ICD-10-CM | POA: Diagnosis not present

## 2018-09-09 DIAGNOSIS — R412 Retrograde amnesia: Secondary | ICD-10-CM | POA: Diagnosis not present

## 2018-09-09 DIAGNOSIS — M810 Age-related osteoporosis without current pathological fracture: Secondary | ICD-10-CM | POA: Diagnosis not present

## 2018-09-10 DIAGNOSIS — F419 Anxiety disorder, unspecified: Secondary | ICD-10-CM | POA: Diagnosis not present

## 2018-09-10 DIAGNOSIS — R412 Retrograde amnesia: Secondary | ICD-10-CM | POA: Diagnosis not present

## 2018-09-10 DIAGNOSIS — I10 Essential (primary) hypertension: Secondary | ICD-10-CM | POA: Diagnosis not present

## 2018-09-10 DIAGNOSIS — M810 Age-related osteoporosis without current pathological fracture: Secondary | ICD-10-CM | POA: Diagnosis not present

## 2018-09-11 DIAGNOSIS — R412 Retrograde amnesia: Secondary | ICD-10-CM | POA: Diagnosis not present

## 2018-09-11 DIAGNOSIS — F419 Anxiety disorder, unspecified: Secondary | ICD-10-CM | POA: Diagnosis not present

## 2018-09-11 DIAGNOSIS — M810 Age-related osteoporosis without current pathological fracture: Secondary | ICD-10-CM | POA: Diagnosis not present

## 2018-09-11 DIAGNOSIS — I10 Essential (primary) hypertension: Secondary | ICD-10-CM | POA: Diagnosis not present

## 2018-09-12 DIAGNOSIS — F419 Anxiety disorder, unspecified: Secondary | ICD-10-CM | POA: Diagnosis not present

## 2018-09-12 DIAGNOSIS — R412 Retrograde amnesia: Secondary | ICD-10-CM | POA: Diagnosis not present

## 2018-09-12 DIAGNOSIS — I10 Essential (primary) hypertension: Secondary | ICD-10-CM | POA: Diagnosis not present

## 2018-09-12 DIAGNOSIS — M810 Age-related osteoporosis without current pathological fracture: Secondary | ICD-10-CM | POA: Diagnosis not present

## 2018-09-13 DIAGNOSIS — R412 Retrograde amnesia: Secondary | ICD-10-CM | POA: Diagnosis not present

## 2018-09-13 DIAGNOSIS — F419 Anxiety disorder, unspecified: Secondary | ICD-10-CM | POA: Diagnosis not present

## 2018-09-13 DIAGNOSIS — I10 Essential (primary) hypertension: Secondary | ICD-10-CM | POA: Diagnosis not present

## 2018-09-13 DIAGNOSIS — M810 Age-related osteoporosis without current pathological fracture: Secondary | ICD-10-CM | POA: Diagnosis not present

## 2018-09-14 DIAGNOSIS — F419 Anxiety disorder, unspecified: Secondary | ICD-10-CM | POA: Diagnosis not present

## 2018-09-14 DIAGNOSIS — R412 Retrograde amnesia: Secondary | ICD-10-CM | POA: Diagnosis not present

## 2018-09-14 DIAGNOSIS — I10 Essential (primary) hypertension: Secondary | ICD-10-CM | POA: Diagnosis not present

## 2018-09-14 DIAGNOSIS — M810 Age-related osteoporosis without current pathological fracture: Secondary | ICD-10-CM | POA: Diagnosis not present

## 2018-09-15 DIAGNOSIS — R412 Retrograde amnesia: Secondary | ICD-10-CM | POA: Diagnosis not present

## 2018-09-15 DIAGNOSIS — I10 Essential (primary) hypertension: Secondary | ICD-10-CM | POA: Diagnosis not present

## 2018-09-15 DIAGNOSIS — F419 Anxiety disorder, unspecified: Secondary | ICD-10-CM | POA: Diagnosis not present

## 2018-09-15 DIAGNOSIS — M810 Age-related osteoporosis without current pathological fracture: Secondary | ICD-10-CM | POA: Diagnosis not present

## 2018-09-16 DIAGNOSIS — R412 Retrograde amnesia: Secondary | ICD-10-CM | POA: Diagnosis not present

## 2018-09-16 DIAGNOSIS — F419 Anxiety disorder, unspecified: Secondary | ICD-10-CM | POA: Diagnosis not present

## 2018-09-16 DIAGNOSIS — I10 Essential (primary) hypertension: Secondary | ICD-10-CM | POA: Diagnosis not present

## 2018-09-16 DIAGNOSIS — M810 Age-related osteoporosis without current pathological fracture: Secondary | ICD-10-CM | POA: Diagnosis not present

## 2018-09-17 DIAGNOSIS — I10 Essential (primary) hypertension: Secondary | ICD-10-CM | POA: Diagnosis not present

## 2018-09-17 DIAGNOSIS — R412 Retrograde amnesia: Secondary | ICD-10-CM | POA: Diagnosis not present

## 2018-09-17 DIAGNOSIS — M810 Age-related osteoporosis without current pathological fracture: Secondary | ICD-10-CM | POA: Diagnosis not present

## 2018-09-17 DIAGNOSIS — F419 Anxiety disorder, unspecified: Secondary | ICD-10-CM | POA: Diagnosis not present

## 2018-09-18 DIAGNOSIS — F419 Anxiety disorder, unspecified: Secondary | ICD-10-CM | POA: Diagnosis not present

## 2018-09-18 DIAGNOSIS — R412 Retrograde amnesia: Secondary | ICD-10-CM | POA: Diagnosis not present

## 2018-09-18 DIAGNOSIS — M810 Age-related osteoporosis without current pathological fracture: Secondary | ICD-10-CM | POA: Diagnosis not present

## 2018-09-18 DIAGNOSIS — I10 Essential (primary) hypertension: Secondary | ICD-10-CM | POA: Diagnosis not present

## 2018-09-19 DIAGNOSIS — R412 Retrograde amnesia: Secondary | ICD-10-CM | POA: Diagnosis not present

## 2018-09-19 DIAGNOSIS — I10 Essential (primary) hypertension: Secondary | ICD-10-CM | POA: Diagnosis not present

## 2018-09-19 DIAGNOSIS — M810 Age-related osteoporosis without current pathological fracture: Secondary | ICD-10-CM | POA: Diagnosis not present

## 2018-09-19 DIAGNOSIS — F419 Anxiety disorder, unspecified: Secondary | ICD-10-CM | POA: Diagnosis not present

## 2018-09-20 DIAGNOSIS — M810 Age-related osteoporosis without current pathological fracture: Secondary | ICD-10-CM | POA: Diagnosis not present

## 2018-09-20 DIAGNOSIS — R412 Retrograde amnesia: Secondary | ICD-10-CM | POA: Diagnosis not present

## 2018-09-20 DIAGNOSIS — I10 Essential (primary) hypertension: Secondary | ICD-10-CM | POA: Diagnosis not present

## 2018-09-20 DIAGNOSIS — F419 Anxiety disorder, unspecified: Secondary | ICD-10-CM | POA: Diagnosis not present

## 2018-09-21 DIAGNOSIS — M810 Age-related osteoporosis without current pathological fracture: Secondary | ICD-10-CM | POA: Diagnosis not present

## 2018-09-21 DIAGNOSIS — I10 Essential (primary) hypertension: Secondary | ICD-10-CM | POA: Diagnosis not present

## 2018-09-21 DIAGNOSIS — F419 Anxiety disorder, unspecified: Secondary | ICD-10-CM | POA: Diagnosis not present

## 2018-09-21 DIAGNOSIS — R412 Retrograde amnesia: Secondary | ICD-10-CM | POA: Diagnosis not present

## 2018-09-22 DIAGNOSIS — I10 Essential (primary) hypertension: Secondary | ICD-10-CM | POA: Diagnosis not present

## 2018-09-22 DIAGNOSIS — F419 Anxiety disorder, unspecified: Secondary | ICD-10-CM | POA: Diagnosis not present

## 2018-09-22 DIAGNOSIS — R412 Retrograde amnesia: Secondary | ICD-10-CM | POA: Diagnosis not present

## 2018-09-22 DIAGNOSIS — M810 Age-related osteoporosis without current pathological fracture: Secondary | ICD-10-CM | POA: Diagnosis not present

## 2018-09-23 DIAGNOSIS — M810 Age-related osteoporosis without current pathological fracture: Secondary | ICD-10-CM | POA: Diagnosis not present

## 2018-09-23 DIAGNOSIS — R412 Retrograde amnesia: Secondary | ICD-10-CM | POA: Diagnosis not present

## 2018-09-23 DIAGNOSIS — F419 Anxiety disorder, unspecified: Secondary | ICD-10-CM | POA: Diagnosis not present

## 2018-09-23 DIAGNOSIS — I10 Essential (primary) hypertension: Secondary | ICD-10-CM | POA: Diagnosis not present

## 2018-09-24 DIAGNOSIS — I10 Essential (primary) hypertension: Secondary | ICD-10-CM | POA: Diagnosis not present

## 2018-09-24 DIAGNOSIS — R412 Retrograde amnesia: Secondary | ICD-10-CM | POA: Diagnosis not present

## 2018-09-24 DIAGNOSIS — M810 Age-related osteoporosis without current pathological fracture: Secondary | ICD-10-CM | POA: Diagnosis not present

## 2018-09-24 DIAGNOSIS — F419 Anxiety disorder, unspecified: Secondary | ICD-10-CM | POA: Diagnosis not present

## 2018-09-25 DIAGNOSIS — I10 Essential (primary) hypertension: Secondary | ICD-10-CM | POA: Diagnosis not present

## 2018-09-25 DIAGNOSIS — M810 Age-related osteoporosis without current pathological fracture: Secondary | ICD-10-CM | POA: Diagnosis not present

## 2018-09-25 DIAGNOSIS — F419 Anxiety disorder, unspecified: Secondary | ICD-10-CM | POA: Diagnosis not present

## 2018-09-25 DIAGNOSIS — R412 Retrograde amnesia: Secondary | ICD-10-CM | POA: Diagnosis not present

## 2018-09-26 DIAGNOSIS — F419 Anxiety disorder, unspecified: Secondary | ICD-10-CM | POA: Diagnosis not present

## 2018-09-26 DIAGNOSIS — M810 Age-related osteoporosis without current pathological fracture: Secondary | ICD-10-CM | POA: Diagnosis not present

## 2018-09-26 DIAGNOSIS — I10 Essential (primary) hypertension: Secondary | ICD-10-CM | POA: Diagnosis not present

## 2018-09-26 DIAGNOSIS — R412 Retrograde amnesia: Secondary | ICD-10-CM | POA: Diagnosis not present

## 2018-09-27 DIAGNOSIS — M810 Age-related osteoporosis without current pathological fracture: Secondary | ICD-10-CM | POA: Diagnosis not present

## 2018-09-27 DIAGNOSIS — I10 Essential (primary) hypertension: Secondary | ICD-10-CM | POA: Diagnosis not present

## 2018-09-27 DIAGNOSIS — F419 Anxiety disorder, unspecified: Secondary | ICD-10-CM | POA: Diagnosis not present

## 2018-09-27 DIAGNOSIS — R412 Retrograde amnesia: Secondary | ICD-10-CM | POA: Diagnosis not present

## 2018-09-28 DIAGNOSIS — F419 Anxiety disorder, unspecified: Secondary | ICD-10-CM | POA: Diagnosis not present

## 2018-09-28 DIAGNOSIS — I10 Essential (primary) hypertension: Secondary | ICD-10-CM | POA: Diagnosis not present

## 2018-09-28 DIAGNOSIS — M810 Age-related osteoporosis without current pathological fracture: Secondary | ICD-10-CM | POA: Diagnosis not present

## 2018-09-28 DIAGNOSIS — R412 Retrograde amnesia: Secondary | ICD-10-CM | POA: Diagnosis not present

## 2018-09-29 DIAGNOSIS — I10 Essential (primary) hypertension: Secondary | ICD-10-CM | POA: Diagnosis not present

## 2018-09-29 DIAGNOSIS — R412 Retrograde amnesia: Secondary | ICD-10-CM | POA: Diagnosis not present

## 2018-09-29 DIAGNOSIS — M810 Age-related osteoporosis without current pathological fracture: Secondary | ICD-10-CM | POA: Diagnosis not present

## 2018-09-29 DIAGNOSIS — F419 Anxiety disorder, unspecified: Secondary | ICD-10-CM | POA: Diagnosis not present

## 2018-09-30 DIAGNOSIS — M810 Age-related osteoporosis without current pathological fracture: Secondary | ICD-10-CM | POA: Diagnosis not present

## 2018-09-30 DIAGNOSIS — F419 Anxiety disorder, unspecified: Secondary | ICD-10-CM | POA: Diagnosis not present

## 2018-09-30 DIAGNOSIS — R412 Retrograde amnesia: Secondary | ICD-10-CM | POA: Diagnosis not present

## 2018-09-30 DIAGNOSIS — I10 Essential (primary) hypertension: Secondary | ICD-10-CM | POA: Diagnosis not present

## 2018-10-01 DIAGNOSIS — I10 Essential (primary) hypertension: Secondary | ICD-10-CM | POA: Diagnosis not present

## 2018-10-01 DIAGNOSIS — F419 Anxiety disorder, unspecified: Secondary | ICD-10-CM | POA: Diagnosis not present

## 2018-10-01 DIAGNOSIS — M810 Age-related osteoporosis without current pathological fracture: Secondary | ICD-10-CM | POA: Diagnosis not present

## 2018-10-01 DIAGNOSIS — R412 Retrograde amnesia: Secondary | ICD-10-CM | POA: Diagnosis not present

## 2018-10-02 DIAGNOSIS — R412 Retrograde amnesia: Secondary | ICD-10-CM | POA: Diagnosis not present

## 2018-10-02 DIAGNOSIS — F419 Anxiety disorder, unspecified: Secondary | ICD-10-CM | POA: Diagnosis not present

## 2018-10-02 DIAGNOSIS — M810 Age-related osteoporosis without current pathological fracture: Secondary | ICD-10-CM | POA: Diagnosis not present

## 2018-10-02 DIAGNOSIS — I10 Essential (primary) hypertension: Secondary | ICD-10-CM | POA: Diagnosis not present

## 2018-10-03 DIAGNOSIS — F419 Anxiety disorder, unspecified: Secondary | ICD-10-CM | POA: Diagnosis not present

## 2018-10-03 DIAGNOSIS — M810 Age-related osteoporosis without current pathological fracture: Secondary | ICD-10-CM | POA: Diagnosis not present

## 2018-10-03 DIAGNOSIS — R412 Retrograde amnesia: Secondary | ICD-10-CM | POA: Diagnosis not present

## 2018-10-03 DIAGNOSIS — I10 Essential (primary) hypertension: Secondary | ICD-10-CM | POA: Diagnosis not present

## 2018-10-04 DIAGNOSIS — M810 Age-related osteoporosis without current pathological fracture: Secondary | ICD-10-CM | POA: Diagnosis not present

## 2018-10-04 DIAGNOSIS — R412 Retrograde amnesia: Secondary | ICD-10-CM | POA: Diagnosis not present

## 2018-10-04 DIAGNOSIS — I10 Essential (primary) hypertension: Secondary | ICD-10-CM | POA: Diagnosis not present

## 2018-10-04 DIAGNOSIS — F419 Anxiety disorder, unspecified: Secondary | ICD-10-CM | POA: Diagnosis not present

## 2018-10-05 DIAGNOSIS — F419 Anxiety disorder, unspecified: Secondary | ICD-10-CM | POA: Diagnosis not present

## 2018-10-05 DIAGNOSIS — M810 Age-related osteoporosis without current pathological fracture: Secondary | ICD-10-CM | POA: Diagnosis not present

## 2018-10-05 DIAGNOSIS — I10 Essential (primary) hypertension: Secondary | ICD-10-CM | POA: Diagnosis not present

## 2018-10-05 DIAGNOSIS — R412 Retrograde amnesia: Secondary | ICD-10-CM | POA: Diagnosis not present

## 2018-10-06 DIAGNOSIS — F419 Anxiety disorder, unspecified: Secondary | ICD-10-CM | POA: Diagnosis not present

## 2018-10-06 DIAGNOSIS — R412 Retrograde amnesia: Secondary | ICD-10-CM | POA: Diagnosis not present

## 2018-10-06 DIAGNOSIS — I10 Essential (primary) hypertension: Secondary | ICD-10-CM | POA: Diagnosis not present

## 2018-10-06 DIAGNOSIS — M810 Age-related osteoporosis without current pathological fracture: Secondary | ICD-10-CM | POA: Diagnosis not present

## 2018-10-07 DIAGNOSIS — M810 Age-related osteoporosis without current pathological fracture: Secondary | ICD-10-CM | POA: Diagnosis not present

## 2018-10-07 DIAGNOSIS — R412 Retrograde amnesia: Secondary | ICD-10-CM | POA: Diagnosis not present

## 2018-10-07 DIAGNOSIS — F419 Anxiety disorder, unspecified: Secondary | ICD-10-CM | POA: Diagnosis not present

## 2018-10-07 DIAGNOSIS — I10 Essential (primary) hypertension: Secondary | ICD-10-CM | POA: Diagnosis not present

## 2018-10-08 DIAGNOSIS — M810 Age-related osteoporosis without current pathological fracture: Secondary | ICD-10-CM | POA: Diagnosis not present

## 2018-10-08 DIAGNOSIS — F419 Anxiety disorder, unspecified: Secondary | ICD-10-CM | POA: Diagnosis not present

## 2018-10-08 DIAGNOSIS — R412 Retrograde amnesia: Secondary | ICD-10-CM | POA: Diagnosis not present

## 2018-10-08 DIAGNOSIS — I10 Essential (primary) hypertension: Secondary | ICD-10-CM | POA: Diagnosis not present

## 2018-10-09 DIAGNOSIS — M810 Age-related osteoporosis without current pathological fracture: Secondary | ICD-10-CM | POA: Diagnosis not present

## 2018-10-09 DIAGNOSIS — F419 Anxiety disorder, unspecified: Secondary | ICD-10-CM | POA: Diagnosis not present

## 2018-10-09 DIAGNOSIS — I10 Essential (primary) hypertension: Secondary | ICD-10-CM | POA: Diagnosis not present

## 2018-10-09 DIAGNOSIS — R412 Retrograde amnesia: Secondary | ICD-10-CM | POA: Diagnosis not present

## 2018-10-10 DIAGNOSIS — F419 Anxiety disorder, unspecified: Secondary | ICD-10-CM | POA: Diagnosis not present

## 2018-10-10 DIAGNOSIS — I10 Essential (primary) hypertension: Secondary | ICD-10-CM | POA: Diagnosis not present

## 2018-10-10 DIAGNOSIS — R412 Retrograde amnesia: Secondary | ICD-10-CM | POA: Diagnosis not present

## 2018-10-10 DIAGNOSIS — M810 Age-related osteoporosis without current pathological fracture: Secondary | ICD-10-CM | POA: Diagnosis not present

## 2018-10-11 DIAGNOSIS — R412 Retrograde amnesia: Secondary | ICD-10-CM | POA: Diagnosis not present

## 2018-10-11 DIAGNOSIS — M810 Age-related osteoporosis without current pathological fracture: Secondary | ICD-10-CM | POA: Diagnosis not present

## 2018-10-11 DIAGNOSIS — I10 Essential (primary) hypertension: Secondary | ICD-10-CM | POA: Diagnosis not present

## 2018-10-11 DIAGNOSIS — F419 Anxiety disorder, unspecified: Secondary | ICD-10-CM | POA: Diagnosis not present

## 2018-10-12 DIAGNOSIS — R412 Retrograde amnesia: Secondary | ICD-10-CM | POA: Diagnosis not present

## 2018-10-12 DIAGNOSIS — M810 Age-related osteoporosis without current pathological fracture: Secondary | ICD-10-CM | POA: Diagnosis not present

## 2018-10-12 DIAGNOSIS — I10 Essential (primary) hypertension: Secondary | ICD-10-CM | POA: Diagnosis not present

## 2018-10-12 DIAGNOSIS — F419 Anxiety disorder, unspecified: Secondary | ICD-10-CM | POA: Diagnosis not present

## 2018-10-13 DIAGNOSIS — M810 Age-related osteoporosis without current pathological fracture: Secondary | ICD-10-CM | POA: Diagnosis not present

## 2018-10-13 DIAGNOSIS — F419 Anxiety disorder, unspecified: Secondary | ICD-10-CM | POA: Diagnosis not present

## 2018-10-13 DIAGNOSIS — R412 Retrograde amnesia: Secondary | ICD-10-CM | POA: Diagnosis not present

## 2018-10-13 DIAGNOSIS — I10 Essential (primary) hypertension: Secondary | ICD-10-CM | POA: Diagnosis not present

## 2018-10-14 DIAGNOSIS — F419 Anxiety disorder, unspecified: Secondary | ICD-10-CM | POA: Diagnosis not present

## 2018-10-14 DIAGNOSIS — R412 Retrograde amnesia: Secondary | ICD-10-CM | POA: Diagnosis not present

## 2018-10-14 DIAGNOSIS — M810 Age-related osteoporosis without current pathological fracture: Secondary | ICD-10-CM | POA: Diagnosis not present

## 2018-10-14 DIAGNOSIS — I10 Essential (primary) hypertension: Secondary | ICD-10-CM | POA: Diagnosis not present

## 2018-10-15 DIAGNOSIS — I10 Essential (primary) hypertension: Secondary | ICD-10-CM | POA: Diagnosis not present

## 2018-10-15 DIAGNOSIS — F419 Anxiety disorder, unspecified: Secondary | ICD-10-CM | POA: Diagnosis not present

## 2018-10-15 DIAGNOSIS — R412 Retrograde amnesia: Secondary | ICD-10-CM | POA: Diagnosis not present

## 2018-10-15 DIAGNOSIS — M810 Age-related osteoporosis without current pathological fracture: Secondary | ICD-10-CM | POA: Diagnosis not present

## 2018-10-16 DIAGNOSIS — I10 Essential (primary) hypertension: Secondary | ICD-10-CM | POA: Diagnosis not present

## 2018-10-16 DIAGNOSIS — F419 Anxiety disorder, unspecified: Secondary | ICD-10-CM | POA: Diagnosis not present

## 2018-10-16 DIAGNOSIS — M810 Age-related osteoporosis without current pathological fracture: Secondary | ICD-10-CM | POA: Diagnosis not present

## 2018-10-16 DIAGNOSIS — R412 Retrograde amnesia: Secondary | ICD-10-CM | POA: Diagnosis not present

## 2018-10-17 DIAGNOSIS — I10 Essential (primary) hypertension: Secondary | ICD-10-CM | POA: Diagnosis not present

## 2018-10-17 DIAGNOSIS — M810 Age-related osteoporosis without current pathological fracture: Secondary | ICD-10-CM | POA: Diagnosis not present

## 2018-10-17 DIAGNOSIS — F419 Anxiety disorder, unspecified: Secondary | ICD-10-CM | POA: Diagnosis not present

## 2018-10-17 DIAGNOSIS — R412 Retrograde amnesia: Secondary | ICD-10-CM | POA: Diagnosis not present

## 2018-10-18 DIAGNOSIS — R412 Retrograde amnesia: Secondary | ICD-10-CM | POA: Diagnosis not present

## 2018-10-18 DIAGNOSIS — I10 Essential (primary) hypertension: Secondary | ICD-10-CM | POA: Diagnosis not present

## 2018-10-18 DIAGNOSIS — M810 Age-related osteoporosis without current pathological fracture: Secondary | ICD-10-CM | POA: Diagnosis not present

## 2018-10-18 DIAGNOSIS — F419 Anxiety disorder, unspecified: Secondary | ICD-10-CM | POA: Diagnosis not present

## 2018-10-19 DIAGNOSIS — M810 Age-related osteoporosis without current pathological fracture: Secondary | ICD-10-CM | POA: Diagnosis not present

## 2018-10-19 DIAGNOSIS — R412 Retrograde amnesia: Secondary | ICD-10-CM | POA: Diagnosis not present

## 2018-10-19 DIAGNOSIS — F419 Anxiety disorder, unspecified: Secondary | ICD-10-CM | POA: Diagnosis not present

## 2018-10-19 DIAGNOSIS — I10 Essential (primary) hypertension: Secondary | ICD-10-CM | POA: Diagnosis not present

## 2018-10-20 DIAGNOSIS — R412 Retrograde amnesia: Secondary | ICD-10-CM | POA: Diagnosis not present

## 2018-10-20 DIAGNOSIS — M810 Age-related osteoporosis without current pathological fracture: Secondary | ICD-10-CM | POA: Diagnosis not present

## 2018-10-20 DIAGNOSIS — F419 Anxiety disorder, unspecified: Secondary | ICD-10-CM | POA: Diagnosis not present

## 2018-10-20 DIAGNOSIS — I10 Essential (primary) hypertension: Secondary | ICD-10-CM | POA: Diagnosis not present

## 2018-10-21 DIAGNOSIS — I10 Essential (primary) hypertension: Secondary | ICD-10-CM | POA: Diagnosis not present

## 2018-10-21 DIAGNOSIS — R412 Retrograde amnesia: Secondary | ICD-10-CM | POA: Diagnosis not present

## 2018-10-21 DIAGNOSIS — M810 Age-related osteoporosis without current pathological fracture: Secondary | ICD-10-CM | POA: Diagnosis not present

## 2018-10-21 DIAGNOSIS — F419 Anxiety disorder, unspecified: Secondary | ICD-10-CM | POA: Diagnosis not present

## 2018-10-22 DIAGNOSIS — I10 Essential (primary) hypertension: Secondary | ICD-10-CM | POA: Diagnosis not present

## 2018-10-22 DIAGNOSIS — F419 Anxiety disorder, unspecified: Secondary | ICD-10-CM | POA: Diagnosis not present

## 2018-10-22 DIAGNOSIS — R412 Retrograde amnesia: Secondary | ICD-10-CM | POA: Diagnosis not present

## 2018-10-22 DIAGNOSIS — M810 Age-related osteoporosis without current pathological fracture: Secondary | ICD-10-CM | POA: Diagnosis not present

## 2018-10-23 DIAGNOSIS — I10 Essential (primary) hypertension: Secondary | ICD-10-CM | POA: Diagnosis not present

## 2018-10-23 DIAGNOSIS — F419 Anxiety disorder, unspecified: Secondary | ICD-10-CM | POA: Diagnosis not present

## 2018-10-23 DIAGNOSIS — R412 Retrograde amnesia: Secondary | ICD-10-CM | POA: Diagnosis not present

## 2018-10-23 DIAGNOSIS — M810 Age-related osteoporosis without current pathological fracture: Secondary | ICD-10-CM | POA: Diagnosis not present

## 2018-10-24 DIAGNOSIS — I10 Essential (primary) hypertension: Secondary | ICD-10-CM | POA: Diagnosis not present

## 2018-10-24 DIAGNOSIS — M810 Age-related osteoporosis without current pathological fracture: Secondary | ICD-10-CM | POA: Diagnosis not present

## 2018-10-24 DIAGNOSIS — R412 Retrograde amnesia: Secondary | ICD-10-CM | POA: Diagnosis not present

## 2018-10-24 DIAGNOSIS — F419 Anxiety disorder, unspecified: Secondary | ICD-10-CM | POA: Diagnosis not present

## 2018-10-25 DIAGNOSIS — R412 Retrograde amnesia: Secondary | ICD-10-CM | POA: Diagnosis not present

## 2018-10-25 DIAGNOSIS — F419 Anxiety disorder, unspecified: Secondary | ICD-10-CM | POA: Diagnosis not present

## 2018-10-25 DIAGNOSIS — I10 Essential (primary) hypertension: Secondary | ICD-10-CM | POA: Diagnosis not present

## 2018-10-25 DIAGNOSIS — M810 Age-related osteoporosis without current pathological fracture: Secondary | ICD-10-CM | POA: Diagnosis not present

## 2018-10-26 DIAGNOSIS — R412 Retrograde amnesia: Secondary | ICD-10-CM | POA: Diagnosis not present

## 2018-10-26 DIAGNOSIS — I10 Essential (primary) hypertension: Secondary | ICD-10-CM | POA: Diagnosis not present

## 2018-10-26 DIAGNOSIS — F419 Anxiety disorder, unspecified: Secondary | ICD-10-CM | POA: Diagnosis not present

## 2018-10-26 DIAGNOSIS — M810 Age-related osteoporosis without current pathological fracture: Secondary | ICD-10-CM | POA: Diagnosis not present

## 2018-10-27 DIAGNOSIS — R412 Retrograde amnesia: Secondary | ICD-10-CM | POA: Diagnosis not present

## 2018-10-27 DIAGNOSIS — I10 Essential (primary) hypertension: Secondary | ICD-10-CM | POA: Diagnosis not present

## 2018-10-27 DIAGNOSIS — F419 Anxiety disorder, unspecified: Secondary | ICD-10-CM | POA: Diagnosis not present

## 2018-10-27 DIAGNOSIS — M810 Age-related osteoporosis without current pathological fracture: Secondary | ICD-10-CM | POA: Diagnosis not present

## 2018-10-28 DIAGNOSIS — I10 Essential (primary) hypertension: Secondary | ICD-10-CM | POA: Diagnosis not present

## 2018-10-28 DIAGNOSIS — F419 Anxiety disorder, unspecified: Secondary | ICD-10-CM | POA: Diagnosis not present

## 2018-10-28 DIAGNOSIS — R412 Retrograde amnesia: Secondary | ICD-10-CM | POA: Diagnosis not present

## 2018-10-28 DIAGNOSIS — M810 Age-related osteoporosis without current pathological fracture: Secondary | ICD-10-CM | POA: Diagnosis not present

## 2018-10-29 DIAGNOSIS — I10 Essential (primary) hypertension: Secondary | ICD-10-CM | POA: Diagnosis not present

## 2018-10-29 DIAGNOSIS — R412 Retrograde amnesia: Secondary | ICD-10-CM | POA: Diagnosis not present

## 2018-10-29 DIAGNOSIS — F419 Anxiety disorder, unspecified: Secondary | ICD-10-CM | POA: Diagnosis not present

## 2018-10-29 DIAGNOSIS — M810 Age-related osteoporosis without current pathological fracture: Secondary | ICD-10-CM | POA: Diagnosis not present

## 2018-10-30 DIAGNOSIS — I10 Essential (primary) hypertension: Secondary | ICD-10-CM | POA: Diagnosis not present

## 2018-10-30 DIAGNOSIS — R412 Retrograde amnesia: Secondary | ICD-10-CM | POA: Diagnosis not present

## 2018-10-30 DIAGNOSIS — F419 Anxiety disorder, unspecified: Secondary | ICD-10-CM | POA: Diagnosis not present

## 2018-10-30 DIAGNOSIS — M810 Age-related osteoporosis without current pathological fracture: Secondary | ICD-10-CM | POA: Diagnosis not present

## 2018-10-31 DIAGNOSIS — M810 Age-related osteoporosis without current pathological fracture: Secondary | ICD-10-CM | POA: Diagnosis not present

## 2018-10-31 DIAGNOSIS — R412 Retrograde amnesia: Secondary | ICD-10-CM | POA: Diagnosis not present

## 2018-10-31 DIAGNOSIS — F419 Anxiety disorder, unspecified: Secondary | ICD-10-CM | POA: Diagnosis not present

## 2018-10-31 DIAGNOSIS — I10 Essential (primary) hypertension: Secondary | ICD-10-CM | POA: Diagnosis not present

## 2018-11-01 DIAGNOSIS — M810 Age-related osteoporosis without current pathological fracture: Secondary | ICD-10-CM | POA: Diagnosis not present

## 2018-11-01 DIAGNOSIS — R412 Retrograde amnesia: Secondary | ICD-10-CM | POA: Diagnosis not present

## 2018-11-01 DIAGNOSIS — F419 Anxiety disorder, unspecified: Secondary | ICD-10-CM | POA: Diagnosis not present

## 2018-11-01 DIAGNOSIS — I10 Essential (primary) hypertension: Secondary | ICD-10-CM | POA: Diagnosis not present

## 2018-11-02 DIAGNOSIS — F419 Anxiety disorder, unspecified: Secondary | ICD-10-CM | POA: Diagnosis not present

## 2018-11-02 DIAGNOSIS — M810 Age-related osteoporosis without current pathological fracture: Secondary | ICD-10-CM | POA: Diagnosis not present

## 2018-11-02 DIAGNOSIS — I10 Essential (primary) hypertension: Secondary | ICD-10-CM | POA: Diagnosis not present

## 2018-11-02 DIAGNOSIS — R412 Retrograde amnesia: Secondary | ICD-10-CM | POA: Diagnosis not present

## 2018-11-03 DIAGNOSIS — I10 Essential (primary) hypertension: Secondary | ICD-10-CM | POA: Diagnosis not present

## 2018-11-03 DIAGNOSIS — F419 Anxiety disorder, unspecified: Secondary | ICD-10-CM | POA: Diagnosis not present

## 2018-11-03 DIAGNOSIS — R412 Retrograde amnesia: Secondary | ICD-10-CM | POA: Diagnosis not present

## 2018-11-03 DIAGNOSIS — M810 Age-related osteoporosis without current pathological fracture: Secondary | ICD-10-CM | POA: Diagnosis not present

## 2018-11-04 DIAGNOSIS — F419 Anxiety disorder, unspecified: Secondary | ICD-10-CM | POA: Diagnosis not present

## 2018-11-04 DIAGNOSIS — K210 Gastro-esophageal reflux disease with esophagitis: Secondary | ICD-10-CM | POA: Diagnosis not present

## 2018-11-04 DIAGNOSIS — M545 Low back pain: Secondary | ICD-10-CM | POA: Diagnosis not present

## 2018-11-04 DIAGNOSIS — R412 Retrograde amnesia: Secondary | ICD-10-CM | POA: Diagnosis not present

## 2018-11-04 DIAGNOSIS — I10 Essential (primary) hypertension: Secondary | ICD-10-CM | POA: Diagnosis not present

## 2018-11-04 DIAGNOSIS — M810 Age-related osteoporosis without current pathological fracture: Secondary | ICD-10-CM | POA: Diagnosis not present

## 2018-11-04 DIAGNOSIS — Z6828 Body mass index (BMI) 28.0-28.9, adult: Secondary | ICD-10-CM | POA: Diagnosis not present

## 2018-11-05 DIAGNOSIS — I10 Essential (primary) hypertension: Secondary | ICD-10-CM | POA: Diagnosis not present

## 2018-11-05 DIAGNOSIS — F419 Anxiety disorder, unspecified: Secondary | ICD-10-CM | POA: Diagnosis not present

## 2018-11-05 DIAGNOSIS — M810 Age-related osteoporosis without current pathological fracture: Secondary | ICD-10-CM | POA: Diagnosis not present

## 2018-11-05 DIAGNOSIS — R412 Retrograde amnesia: Secondary | ICD-10-CM | POA: Diagnosis not present

## 2018-11-06 DIAGNOSIS — M810 Age-related osteoporosis without current pathological fracture: Secondary | ICD-10-CM | POA: Diagnosis not present

## 2018-11-06 DIAGNOSIS — F419 Anxiety disorder, unspecified: Secondary | ICD-10-CM | POA: Diagnosis not present

## 2018-11-06 DIAGNOSIS — R412 Retrograde amnesia: Secondary | ICD-10-CM | POA: Diagnosis not present

## 2018-11-06 DIAGNOSIS — I10 Essential (primary) hypertension: Secondary | ICD-10-CM | POA: Diagnosis not present

## 2018-11-07 DIAGNOSIS — M810 Age-related osteoporosis without current pathological fracture: Secondary | ICD-10-CM | POA: Diagnosis not present

## 2018-11-07 DIAGNOSIS — I10 Essential (primary) hypertension: Secondary | ICD-10-CM | POA: Diagnosis not present

## 2018-11-07 DIAGNOSIS — F419 Anxiety disorder, unspecified: Secondary | ICD-10-CM | POA: Diagnosis not present

## 2018-11-07 DIAGNOSIS — R412 Retrograde amnesia: Secondary | ICD-10-CM | POA: Diagnosis not present

## 2018-11-08 DIAGNOSIS — I10 Essential (primary) hypertension: Secondary | ICD-10-CM | POA: Diagnosis not present

## 2018-11-08 DIAGNOSIS — R412 Retrograde amnesia: Secondary | ICD-10-CM | POA: Diagnosis not present

## 2018-11-08 DIAGNOSIS — F419 Anxiety disorder, unspecified: Secondary | ICD-10-CM | POA: Diagnosis not present

## 2018-11-08 DIAGNOSIS — M810 Age-related osteoporosis without current pathological fracture: Secondary | ICD-10-CM | POA: Diagnosis not present

## 2018-11-09 DIAGNOSIS — I10 Essential (primary) hypertension: Secondary | ICD-10-CM | POA: Diagnosis not present

## 2018-11-09 DIAGNOSIS — M810 Age-related osteoporosis without current pathological fracture: Secondary | ICD-10-CM | POA: Diagnosis not present

## 2018-11-09 DIAGNOSIS — F419 Anxiety disorder, unspecified: Secondary | ICD-10-CM | POA: Diagnosis not present

## 2018-11-09 DIAGNOSIS — R412 Retrograde amnesia: Secondary | ICD-10-CM | POA: Diagnosis not present

## 2018-11-10 DIAGNOSIS — R412 Retrograde amnesia: Secondary | ICD-10-CM | POA: Diagnosis not present

## 2018-11-10 DIAGNOSIS — I10 Essential (primary) hypertension: Secondary | ICD-10-CM | POA: Diagnosis not present

## 2018-11-10 DIAGNOSIS — F419 Anxiety disorder, unspecified: Secondary | ICD-10-CM | POA: Diagnosis not present

## 2018-11-10 DIAGNOSIS — M810 Age-related osteoporosis without current pathological fracture: Secondary | ICD-10-CM | POA: Diagnosis not present

## 2018-11-11 DIAGNOSIS — R412 Retrograde amnesia: Secondary | ICD-10-CM | POA: Diagnosis not present

## 2018-11-11 DIAGNOSIS — I10 Essential (primary) hypertension: Secondary | ICD-10-CM | POA: Diagnosis not present

## 2018-11-11 DIAGNOSIS — M810 Age-related osteoporosis without current pathological fracture: Secondary | ICD-10-CM | POA: Diagnosis not present

## 2018-11-11 DIAGNOSIS — F419 Anxiety disorder, unspecified: Secondary | ICD-10-CM | POA: Diagnosis not present

## 2018-11-12 DIAGNOSIS — R412 Retrograde amnesia: Secondary | ICD-10-CM | POA: Diagnosis not present

## 2018-11-12 DIAGNOSIS — M810 Age-related osteoporosis without current pathological fracture: Secondary | ICD-10-CM | POA: Diagnosis not present

## 2018-11-12 DIAGNOSIS — I10 Essential (primary) hypertension: Secondary | ICD-10-CM | POA: Diagnosis not present

## 2018-11-12 DIAGNOSIS — F419 Anxiety disorder, unspecified: Secondary | ICD-10-CM | POA: Diagnosis not present

## 2018-11-13 DIAGNOSIS — M810 Age-related osteoporosis without current pathological fracture: Secondary | ICD-10-CM | POA: Diagnosis not present

## 2018-11-13 DIAGNOSIS — F419 Anxiety disorder, unspecified: Secondary | ICD-10-CM | POA: Diagnosis not present

## 2018-11-13 DIAGNOSIS — I10 Essential (primary) hypertension: Secondary | ICD-10-CM | POA: Diagnosis not present

## 2018-11-13 DIAGNOSIS — R412 Retrograde amnesia: Secondary | ICD-10-CM | POA: Diagnosis not present

## 2018-11-14 DIAGNOSIS — I10 Essential (primary) hypertension: Secondary | ICD-10-CM | POA: Diagnosis not present

## 2018-11-14 DIAGNOSIS — F419 Anxiety disorder, unspecified: Secondary | ICD-10-CM | POA: Diagnosis not present

## 2018-11-14 DIAGNOSIS — R412 Retrograde amnesia: Secondary | ICD-10-CM | POA: Diagnosis not present

## 2018-11-14 DIAGNOSIS — M810 Age-related osteoporosis without current pathological fracture: Secondary | ICD-10-CM | POA: Diagnosis not present

## 2018-11-15 DIAGNOSIS — R412 Retrograde amnesia: Secondary | ICD-10-CM | POA: Diagnosis not present

## 2018-11-15 DIAGNOSIS — F419 Anxiety disorder, unspecified: Secondary | ICD-10-CM | POA: Diagnosis not present

## 2018-11-15 DIAGNOSIS — M810 Age-related osteoporosis without current pathological fracture: Secondary | ICD-10-CM | POA: Diagnosis not present

## 2018-11-15 DIAGNOSIS — I10 Essential (primary) hypertension: Secondary | ICD-10-CM | POA: Diagnosis not present

## 2018-11-16 DIAGNOSIS — M810 Age-related osteoporosis without current pathological fracture: Secondary | ICD-10-CM | POA: Diagnosis not present

## 2018-11-16 DIAGNOSIS — F419 Anxiety disorder, unspecified: Secondary | ICD-10-CM | POA: Diagnosis not present

## 2018-11-16 DIAGNOSIS — I10 Essential (primary) hypertension: Secondary | ICD-10-CM | POA: Diagnosis not present

## 2018-11-16 DIAGNOSIS — R412 Retrograde amnesia: Secondary | ICD-10-CM | POA: Diagnosis not present

## 2018-11-17 DIAGNOSIS — I10 Essential (primary) hypertension: Secondary | ICD-10-CM | POA: Diagnosis not present

## 2018-11-17 DIAGNOSIS — F419 Anxiety disorder, unspecified: Secondary | ICD-10-CM | POA: Diagnosis not present

## 2018-11-17 DIAGNOSIS — M810 Age-related osteoporosis without current pathological fracture: Secondary | ICD-10-CM | POA: Diagnosis not present

## 2018-11-17 DIAGNOSIS — R412 Retrograde amnesia: Secondary | ICD-10-CM | POA: Diagnosis not present

## 2018-11-18 DIAGNOSIS — R412 Retrograde amnesia: Secondary | ICD-10-CM | POA: Diagnosis not present

## 2018-11-18 DIAGNOSIS — I10 Essential (primary) hypertension: Secondary | ICD-10-CM | POA: Diagnosis not present

## 2018-11-18 DIAGNOSIS — M810 Age-related osteoporosis without current pathological fracture: Secondary | ICD-10-CM | POA: Diagnosis not present

## 2018-11-18 DIAGNOSIS — F419 Anxiety disorder, unspecified: Secondary | ICD-10-CM | POA: Diagnosis not present

## 2018-11-19 DIAGNOSIS — F419 Anxiety disorder, unspecified: Secondary | ICD-10-CM | POA: Diagnosis not present

## 2018-11-19 DIAGNOSIS — R412 Retrograde amnesia: Secondary | ICD-10-CM | POA: Diagnosis not present

## 2018-11-19 DIAGNOSIS — M810 Age-related osteoporosis without current pathological fracture: Secondary | ICD-10-CM | POA: Diagnosis not present

## 2018-11-19 DIAGNOSIS — I10 Essential (primary) hypertension: Secondary | ICD-10-CM | POA: Diagnosis not present

## 2018-11-20 DIAGNOSIS — I10 Essential (primary) hypertension: Secondary | ICD-10-CM | POA: Diagnosis not present

## 2018-11-20 DIAGNOSIS — M810 Age-related osteoporosis without current pathological fracture: Secondary | ICD-10-CM | POA: Diagnosis not present

## 2018-11-20 DIAGNOSIS — R412 Retrograde amnesia: Secondary | ICD-10-CM | POA: Diagnosis not present

## 2018-11-20 DIAGNOSIS — F419 Anxiety disorder, unspecified: Secondary | ICD-10-CM | POA: Diagnosis not present

## 2018-11-21 DIAGNOSIS — M810 Age-related osteoporosis without current pathological fracture: Secondary | ICD-10-CM | POA: Diagnosis not present

## 2018-11-21 DIAGNOSIS — R412 Retrograde amnesia: Secondary | ICD-10-CM | POA: Diagnosis not present

## 2018-11-21 DIAGNOSIS — I10 Essential (primary) hypertension: Secondary | ICD-10-CM | POA: Diagnosis not present

## 2018-11-21 DIAGNOSIS — F419 Anxiety disorder, unspecified: Secondary | ICD-10-CM | POA: Diagnosis not present

## 2018-11-22 DIAGNOSIS — I10 Essential (primary) hypertension: Secondary | ICD-10-CM | POA: Diagnosis not present

## 2018-11-22 DIAGNOSIS — R412 Retrograde amnesia: Secondary | ICD-10-CM | POA: Diagnosis not present

## 2018-11-22 DIAGNOSIS — M810 Age-related osteoporosis without current pathological fracture: Secondary | ICD-10-CM | POA: Diagnosis not present

## 2018-11-22 DIAGNOSIS — F419 Anxiety disorder, unspecified: Secondary | ICD-10-CM | POA: Diagnosis not present

## 2018-11-23 DIAGNOSIS — R412 Retrograde amnesia: Secondary | ICD-10-CM | POA: Diagnosis not present

## 2018-11-23 DIAGNOSIS — M810 Age-related osteoporosis without current pathological fracture: Secondary | ICD-10-CM | POA: Diagnosis not present

## 2018-11-23 DIAGNOSIS — F419 Anxiety disorder, unspecified: Secondary | ICD-10-CM | POA: Diagnosis not present

## 2018-11-23 DIAGNOSIS — I10 Essential (primary) hypertension: Secondary | ICD-10-CM | POA: Diagnosis not present

## 2018-11-24 DIAGNOSIS — F419 Anxiety disorder, unspecified: Secondary | ICD-10-CM | POA: Diagnosis not present

## 2018-11-24 DIAGNOSIS — M810 Age-related osteoporosis without current pathological fracture: Secondary | ICD-10-CM | POA: Diagnosis not present

## 2018-11-24 DIAGNOSIS — R412 Retrograde amnesia: Secondary | ICD-10-CM | POA: Diagnosis not present

## 2018-11-24 DIAGNOSIS — I10 Essential (primary) hypertension: Secondary | ICD-10-CM | POA: Diagnosis not present

## 2018-11-25 DIAGNOSIS — R412 Retrograde amnesia: Secondary | ICD-10-CM | POA: Diagnosis not present

## 2018-11-25 DIAGNOSIS — M810 Age-related osteoporosis without current pathological fracture: Secondary | ICD-10-CM | POA: Diagnosis not present

## 2018-11-25 DIAGNOSIS — I10 Essential (primary) hypertension: Secondary | ICD-10-CM | POA: Diagnosis not present

## 2018-11-25 DIAGNOSIS — F419 Anxiety disorder, unspecified: Secondary | ICD-10-CM | POA: Diagnosis not present

## 2018-11-26 DIAGNOSIS — I10 Essential (primary) hypertension: Secondary | ICD-10-CM | POA: Diagnosis not present

## 2018-11-26 DIAGNOSIS — R412 Retrograde amnesia: Secondary | ICD-10-CM | POA: Diagnosis not present

## 2018-11-26 DIAGNOSIS — F419 Anxiety disorder, unspecified: Secondary | ICD-10-CM | POA: Diagnosis not present

## 2018-11-26 DIAGNOSIS — M810 Age-related osteoporosis without current pathological fracture: Secondary | ICD-10-CM | POA: Diagnosis not present

## 2018-11-27 DIAGNOSIS — M810 Age-related osteoporosis without current pathological fracture: Secondary | ICD-10-CM | POA: Diagnosis not present

## 2018-11-27 DIAGNOSIS — I10 Essential (primary) hypertension: Secondary | ICD-10-CM | POA: Diagnosis not present

## 2018-11-27 DIAGNOSIS — R412 Retrograde amnesia: Secondary | ICD-10-CM | POA: Diagnosis not present

## 2018-11-27 DIAGNOSIS — F419 Anxiety disorder, unspecified: Secondary | ICD-10-CM | POA: Diagnosis not present

## 2018-11-28 DIAGNOSIS — R412 Retrograde amnesia: Secondary | ICD-10-CM | POA: Diagnosis not present

## 2018-11-28 DIAGNOSIS — I10 Essential (primary) hypertension: Secondary | ICD-10-CM | POA: Diagnosis not present

## 2018-11-28 DIAGNOSIS — F419 Anxiety disorder, unspecified: Secondary | ICD-10-CM | POA: Diagnosis not present

## 2018-11-28 DIAGNOSIS — M810 Age-related osteoporosis without current pathological fracture: Secondary | ICD-10-CM | POA: Diagnosis not present

## 2018-11-29 DIAGNOSIS — I10 Essential (primary) hypertension: Secondary | ICD-10-CM | POA: Diagnosis not present

## 2018-11-29 DIAGNOSIS — M810 Age-related osteoporosis without current pathological fracture: Secondary | ICD-10-CM | POA: Diagnosis not present

## 2018-11-29 DIAGNOSIS — F419 Anxiety disorder, unspecified: Secondary | ICD-10-CM | POA: Diagnosis not present

## 2018-11-29 DIAGNOSIS — R412 Retrograde amnesia: Secondary | ICD-10-CM | POA: Diagnosis not present

## 2018-11-30 DIAGNOSIS — M810 Age-related osteoporosis without current pathological fracture: Secondary | ICD-10-CM | POA: Diagnosis not present

## 2018-11-30 DIAGNOSIS — I10 Essential (primary) hypertension: Secondary | ICD-10-CM | POA: Diagnosis not present

## 2018-11-30 DIAGNOSIS — F419 Anxiety disorder, unspecified: Secondary | ICD-10-CM | POA: Diagnosis not present

## 2018-11-30 DIAGNOSIS — R412 Retrograde amnesia: Secondary | ICD-10-CM | POA: Diagnosis not present

## 2018-12-01 DIAGNOSIS — I10 Essential (primary) hypertension: Secondary | ICD-10-CM | POA: Diagnosis not present

## 2018-12-01 DIAGNOSIS — F419 Anxiety disorder, unspecified: Secondary | ICD-10-CM | POA: Diagnosis not present

## 2018-12-01 DIAGNOSIS — M810 Age-related osteoporosis without current pathological fracture: Secondary | ICD-10-CM | POA: Diagnosis not present

## 2018-12-01 DIAGNOSIS — R412 Retrograde amnesia: Secondary | ICD-10-CM | POA: Diagnosis not present

## 2018-12-02 DIAGNOSIS — F419 Anxiety disorder, unspecified: Secondary | ICD-10-CM | POA: Diagnosis not present

## 2018-12-02 DIAGNOSIS — R412 Retrograde amnesia: Secondary | ICD-10-CM | POA: Diagnosis not present

## 2018-12-02 DIAGNOSIS — M810 Age-related osteoporosis without current pathological fracture: Secondary | ICD-10-CM | POA: Diagnosis not present

## 2018-12-02 DIAGNOSIS — I10 Essential (primary) hypertension: Secondary | ICD-10-CM | POA: Diagnosis not present

## 2018-12-03 DIAGNOSIS — R412 Retrograde amnesia: Secondary | ICD-10-CM | POA: Diagnosis not present

## 2018-12-03 DIAGNOSIS — F419 Anxiety disorder, unspecified: Secondary | ICD-10-CM | POA: Diagnosis not present

## 2018-12-03 DIAGNOSIS — I10 Essential (primary) hypertension: Secondary | ICD-10-CM | POA: Diagnosis not present

## 2018-12-03 DIAGNOSIS — M810 Age-related osteoporosis without current pathological fracture: Secondary | ICD-10-CM | POA: Diagnosis not present

## 2018-12-04 DIAGNOSIS — F419 Anxiety disorder, unspecified: Secondary | ICD-10-CM | POA: Diagnosis not present

## 2018-12-04 DIAGNOSIS — I10 Essential (primary) hypertension: Secondary | ICD-10-CM | POA: Diagnosis not present

## 2018-12-04 DIAGNOSIS — R412 Retrograde amnesia: Secondary | ICD-10-CM | POA: Diagnosis not present

## 2018-12-04 DIAGNOSIS — M810 Age-related osteoporosis without current pathological fracture: Secondary | ICD-10-CM | POA: Diagnosis not present

## 2018-12-05 DIAGNOSIS — M810 Age-related osteoporosis without current pathological fracture: Secondary | ICD-10-CM | POA: Diagnosis not present

## 2018-12-05 DIAGNOSIS — I10 Essential (primary) hypertension: Secondary | ICD-10-CM | POA: Diagnosis not present

## 2018-12-05 DIAGNOSIS — R412 Retrograde amnesia: Secondary | ICD-10-CM | POA: Diagnosis not present

## 2018-12-05 DIAGNOSIS — F419 Anxiety disorder, unspecified: Secondary | ICD-10-CM | POA: Diagnosis not present

## 2019-03-02 DIAGNOSIS — Z1389 Encounter for screening for other disorder: Secondary | ICD-10-CM | POA: Diagnosis not present

## 2019-03-02 DIAGNOSIS — I10 Essential (primary) hypertension: Secondary | ICD-10-CM | POA: Diagnosis not present

## 2019-03-02 DIAGNOSIS — Z6828 Body mass index (BMI) 28.0-28.9, adult: Secondary | ICD-10-CM | POA: Diagnosis not present

## 2019-03-02 DIAGNOSIS — M545 Low back pain: Secondary | ICD-10-CM | POA: Diagnosis not present

## 2019-03-02 DIAGNOSIS — K210 Gastro-esophageal reflux disease with esophagitis: Secondary | ICD-10-CM | POA: Diagnosis not present

## 2019-03-02 DIAGNOSIS — M810 Age-related osteoporosis without current pathological fracture: Secondary | ICD-10-CM | POA: Diagnosis not present

## 2019-03-02 DIAGNOSIS — Z0000 Encounter for general adult medical examination without abnormal findings: Secondary | ICD-10-CM | POA: Diagnosis not present

## 2019-06-10 DIAGNOSIS — M545 Low back pain: Secondary | ICD-10-CM | POA: Diagnosis not present

## 2019-06-10 DIAGNOSIS — I10 Essential (primary) hypertension: Secondary | ICD-10-CM | POA: Diagnosis not present

## 2019-06-10 DIAGNOSIS — Z0000 Encounter for general adult medical examination without abnormal findings: Secondary | ICD-10-CM | POA: Diagnosis not present

## 2019-06-10 DIAGNOSIS — Z6828 Body mass index (BMI) 28.0-28.9, adult: Secondary | ICD-10-CM | POA: Diagnosis not present

## 2019-06-10 DIAGNOSIS — K210 Gastro-esophageal reflux disease with esophagitis: Secondary | ICD-10-CM | POA: Diagnosis not present

## 2019-06-10 DIAGNOSIS — J301 Allergic rhinitis due to pollen: Secondary | ICD-10-CM | POA: Diagnosis not present

## 2019-06-10 DIAGNOSIS — M810 Age-related osteoporosis without current pathological fracture: Secondary | ICD-10-CM | POA: Diagnosis not present

## 2019-09-13 DIAGNOSIS — M545 Low back pain: Secondary | ICD-10-CM | POA: Diagnosis not present

## 2019-09-13 DIAGNOSIS — I10 Essential (primary) hypertension: Secondary | ICD-10-CM | POA: Diagnosis not present

## 2019-09-13 DIAGNOSIS — J301 Allergic rhinitis due to pollen: Secondary | ICD-10-CM | POA: Diagnosis not present

## 2019-09-13 DIAGNOSIS — M810 Age-related osteoporosis without current pathological fracture: Secondary | ICD-10-CM | POA: Diagnosis not present

## 2019-09-13 DIAGNOSIS — Z6829 Body mass index (BMI) 29.0-29.9, adult: Secondary | ICD-10-CM | POA: Diagnosis not present

## 2019-09-13 DIAGNOSIS — K219 Gastro-esophageal reflux disease without esophagitis: Secondary | ICD-10-CM | POA: Diagnosis not present

## 2019-12-20 DIAGNOSIS — M810 Age-related osteoporosis without current pathological fracture: Secondary | ICD-10-CM | POA: Diagnosis not present

## 2019-12-20 DIAGNOSIS — M545 Low back pain: Secondary | ICD-10-CM | POA: Diagnosis not present

## 2019-12-20 DIAGNOSIS — Z6829 Body mass index (BMI) 29.0-29.9, adult: Secondary | ICD-10-CM | POA: Diagnosis not present

## 2019-12-20 DIAGNOSIS — J301 Allergic rhinitis due to pollen: Secondary | ICD-10-CM | POA: Diagnosis not present

## 2019-12-20 DIAGNOSIS — I10 Essential (primary) hypertension: Secondary | ICD-10-CM | POA: Diagnosis not present

## 2019-12-20 DIAGNOSIS — K219 Gastro-esophageal reflux disease without esophagitis: Secondary | ICD-10-CM | POA: Diagnosis not present

## 2020-02-22 DIAGNOSIS — B351 Tinea unguium: Secondary | ICD-10-CM | POA: Diagnosis not present

## 2020-02-22 DIAGNOSIS — M79676 Pain in unspecified toe(s): Secondary | ICD-10-CM | POA: Diagnosis not present

## 2020-03-29 DIAGNOSIS — R55 Syncope and collapse: Secondary | ICD-10-CM | POA: Diagnosis not present

## 2020-03-29 DIAGNOSIS — I10 Essential (primary) hypertension: Secondary | ICD-10-CM | POA: Diagnosis not present

## 2020-03-29 DIAGNOSIS — K5900 Constipation, unspecified: Secondary | ICD-10-CM | POA: Diagnosis not present

## 2020-03-29 DIAGNOSIS — R001 Bradycardia, unspecified: Secondary | ICD-10-CM | POA: Diagnosis not present

## 2020-03-29 DIAGNOSIS — Z87891 Personal history of nicotine dependence: Secondary | ICD-10-CM | POA: Diagnosis not present

## 2020-04-04 DIAGNOSIS — J301 Allergic rhinitis due to pollen: Secondary | ICD-10-CM | POA: Diagnosis not present

## 2020-04-04 DIAGNOSIS — K219 Gastro-esophageal reflux disease without esophagitis: Secondary | ICD-10-CM | POA: Diagnosis not present

## 2020-04-04 DIAGNOSIS — M810 Age-related osteoporosis without current pathological fracture: Secondary | ICD-10-CM | POA: Diagnosis not present

## 2020-04-04 DIAGNOSIS — Z6828 Body mass index (BMI) 28.0-28.9, adult: Secondary | ICD-10-CM | POA: Diagnosis not present

## 2020-04-04 DIAGNOSIS — K5900 Constipation, unspecified: Secondary | ICD-10-CM | POA: Diagnosis not present

## 2020-04-04 DIAGNOSIS — M545 Low back pain: Secondary | ICD-10-CM | POA: Diagnosis not present

## 2020-04-04 DIAGNOSIS — I10 Essential (primary) hypertension: Secondary | ICD-10-CM | POA: Diagnosis not present

## 2020-05-24 DIAGNOSIS — H6123 Impacted cerumen, bilateral: Secondary | ICD-10-CM | POA: Diagnosis not present

## 2020-05-24 DIAGNOSIS — H903 Sensorineural hearing loss, bilateral: Secondary | ICD-10-CM | POA: Diagnosis not present

## 2020-06-15 DIAGNOSIS — H524 Presbyopia: Secondary | ICD-10-CM | POA: Diagnosis not present

## 2020-06-15 DIAGNOSIS — H43393 Other vitreous opacities, bilateral: Secondary | ICD-10-CM | POA: Diagnosis not present

## 2020-07-12 DIAGNOSIS — M810 Age-related osteoporosis without current pathological fracture: Secondary | ICD-10-CM | POA: Diagnosis not present

## 2020-07-12 DIAGNOSIS — M545 Low back pain, unspecified: Secondary | ICD-10-CM | POA: Diagnosis not present

## 2020-07-12 DIAGNOSIS — Z6829 Body mass index (BMI) 29.0-29.9, adult: Secondary | ICD-10-CM | POA: Diagnosis not present

## 2020-07-12 DIAGNOSIS — J301 Allergic rhinitis due to pollen: Secondary | ICD-10-CM | POA: Diagnosis not present

## 2020-07-12 DIAGNOSIS — I10 Essential (primary) hypertension: Secondary | ICD-10-CM | POA: Diagnosis not present

## 2020-07-12 DIAGNOSIS — K5900 Constipation, unspecified: Secondary | ICD-10-CM | POA: Diagnosis not present

## 2020-07-12 DIAGNOSIS — K219 Gastro-esophageal reflux disease without esophagitis: Secondary | ICD-10-CM | POA: Diagnosis not present

## 2020-08-08 DIAGNOSIS — Z20828 Contact with and (suspected) exposure to other viral communicable diseases: Secondary | ICD-10-CM | POA: Diagnosis not present

## 2020-08-08 DIAGNOSIS — U071 COVID-19: Secondary | ICD-10-CM | POA: Diagnosis not present

## 2020-08-09 DIAGNOSIS — J209 Acute bronchitis, unspecified: Secondary | ICD-10-CM | POA: Diagnosis not present

## 2020-08-09 DIAGNOSIS — Z6829 Body mass index (BMI) 29.0-29.9, adult: Secondary | ICD-10-CM | POA: Diagnosis not present

## 2020-08-15 DIAGNOSIS — U071 COVID-19: Secondary | ICD-10-CM | POA: Diagnosis not present

## 2020-08-15 DIAGNOSIS — Z20828 Contact with and (suspected) exposure to other viral communicable diseases: Secondary | ICD-10-CM | POA: Diagnosis not present

## 2020-08-16 DIAGNOSIS — Z7689 Persons encountering health services in other specified circumstances: Secondary | ICD-10-CM | POA: Diagnosis not present

## 2020-08-17 DIAGNOSIS — Z20828 Contact with and (suspected) exposure to other viral communicable diseases: Secondary | ICD-10-CM | POA: Diagnosis not present

## 2020-08-17 DIAGNOSIS — U071 COVID-19: Secondary | ICD-10-CM | POA: Diagnosis not present

## 2020-08-22 DIAGNOSIS — U071 COVID-19: Secondary | ICD-10-CM | POA: Diagnosis not present

## 2020-08-22 DIAGNOSIS — Z20828 Contact with and (suspected) exposure to other viral communicable diseases: Secondary | ICD-10-CM | POA: Diagnosis not present

## 2020-08-24 DIAGNOSIS — U071 COVID-19: Secondary | ICD-10-CM | POA: Diagnosis not present

## 2020-08-24 DIAGNOSIS — Z20828 Contact with and (suspected) exposure to other viral communicable diseases: Secondary | ICD-10-CM | POA: Diagnosis not present

## 2020-09-05 DIAGNOSIS — U071 COVID-19: Secondary | ICD-10-CM | POA: Diagnosis not present

## 2020-09-05 DIAGNOSIS — Z20828 Contact with and (suspected) exposure to other viral communicable diseases: Secondary | ICD-10-CM | POA: Diagnosis not present

## 2020-09-07 DIAGNOSIS — U071 COVID-19: Secondary | ICD-10-CM | POA: Diagnosis not present

## 2020-09-07 DIAGNOSIS — Z20828 Contact with and (suspected) exposure to other viral communicable diseases: Secondary | ICD-10-CM | POA: Diagnosis not present

## 2020-09-12 DIAGNOSIS — U071 COVID-19: Secondary | ICD-10-CM | POA: Diagnosis not present

## 2020-09-12 DIAGNOSIS — Z20828 Contact with and (suspected) exposure to other viral communicable diseases: Secondary | ICD-10-CM | POA: Diagnosis not present

## 2020-09-14 DIAGNOSIS — U071 COVID-19: Secondary | ICD-10-CM | POA: Diagnosis not present

## 2020-09-14 DIAGNOSIS — Z20828 Contact with and (suspected) exposure to other viral communicable diseases: Secondary | ICD-10-CM | POA: Diagnosis not present

## 2020-09-19 DIAGNOSIS — Z20828 Contact with and (suspected) exposure to other viral communicable diseases: Secondary | ICD-10-CM | POA: Diagnosis not present

## 2020-09-19 DIAGNOSIS — U071 COVID-19: Secondary | ICD-10-CM | POA: Diagnosis not present

## 2020-09-21 DIAGNOSIS — Z20828 Contact with and (suspected) exposure to other viral communicable diseases: Secondary | ICD-10-CM | POA: Diagnosis not present

## 2020-09-21 DIAGNOSIS — U071 COVID-19: Secondary | ICD-10-CM | POA: Diagnosis not present

## 2020-09-28 DIAGNOSIS — U071 COVID-19: Secondary | ICD-10-CM | POA: Diagnosis not present

## 2020-09-28 DIAGNOSIS — Z20828 Contact with and (suspected) exposure to other viral communicable diseases: Secondary | ICD-10-CM | POA: Diagnosis not present

## 2020-10-03 DIAGNOSIS — U071 COVID-19: Secondary | ICD-10-CM | POA: Diagnosis not present

## 2020-10-03 DIAGNOSIS — Z20828 Contact with and (suspected) exposure to other viral communicable diseases: Secondary | ICD-10-CM | POA: Diagnosis not present

## 2020-10-05 DIAGNOSIS — U071 COVID-19: Secondary | ICD-10-CM | POA: Diagnosis not present

## 2020-10-05 DIAGNOSIS — Z20828 Contact with and (suspected) exposure to other viral communicable diseases: Secondary | ICD-10-CM | POA: Diagnosis not present

## 2020-10-10 DIAGNOSIS — U071 COVID-19: Secondary | ICD-10-CM | POA: Diagnosis not present

## 2020-10-10 DIAGNOSIS — Z20828 Contact with and (suspected) exposure to other viral communicable diseases: Secondary | ICD-10-CM | POA: Diagnosis not present

## 2020-10-12 DIAGNOSIS — Z20828 Contact with and (suspected) exposure to other viral communicable diseases: Secondary | ICD-10-CM | POA: Diagnosis not present

## 2020-10-12 DIAGNOSIS — U071 COVID-19: Secondary | ICD-10-CM | POA: Diagnosis not present

## 2020-10-17 DIAGNOSIS — Z20828 Contact with and (suspected) exposure to other viral communicable diseases: Secondary | ICD-10-CM | POA: Diagnosis not present

## 2020-10-17 DIAGNOSIS — U071 COVID-19: Secondary | ICD-10-CM | POA: Diagnosis not present

## 2020-10-19 DIAGNOSIS — Z20828 Contact with and (suspected) exposure to other viral communicable diseases: Secondary | ICD-10-CM | POA: Diagnosis not present

## 2020-10-19 DIAGNOSIS — U071 COVID-19: Secondary | ICD-10-CM | POA: Diagnosis not present

## 2020-10-24 DIAGNOSIS — Z20828 Contact with and (suspected) exposure to other viral communicable diseases: Secondary | ICD-10-CM | POA: Diagnosis not present

## 2020-10-24 DIAGNOSIS — U071 COVID-19: Secondary | ICD-10-CM | POA: Diagnosis not present

## 2020-10-26 DIAGNOSIS — Z20828 Contact with and (suspected) exposure to other viral communicable diseases: Secondary | ICD-10-CM | POA: Diagnosis not present

## 2020-10-26 DIAGNOSIS — U071 COVID-19: Secondary | ICD-10-CM | POA: Diagnosis not present

## 2020-10-27 DIAGNOSIS — Z20828 Contact with and (suspected) exposure to other viral communicable diseases: Secondary | ICD-10-CM | POA: Diagnosis not present

## 2020-10-27 DIAGNOSIS — U071 COVID-19: Secondary | ICD-10-CM | POA: Diagnosis not present

## 2020-10-31 DIAGNOSIS — U071 COVID-19: Secondary | ICD-10-CM | POA: Diagnosis not present

## 2020-10-31 DIAGNOSIS — Z20828 Contact with and (suspected) exposure to other viral communicable diseases: Secondary | ICD-10-CM | POA: Diagnosis not present

## 2020-11-01 DIAGNOSIS — H353131 Nonexudative age-related macular degeneration, bilateral, early dry stage: Secondary | ICD-10-CM | POA: Diagnosis not present

## 2020-11-07 DIAGNOSIS — Z20828 Contact with and (suspected) exposure to other viral communicable diseases: Secondary | ICD-10-CM | POA: Diagnosis not present

## 2020-11-07 DIAGNOSIS — U071 COVID-19: Secondary | ICD-10-CM | POA: Diagnosis not present

## 2020-11-09 DIAGNOSIS — Z20828 Contact with and (suspected) exposure to other viral communicable diseases: Secondary | ICD-10-CM | POA: Diagnosis not present

## 2020-11-09 DIAGNOSIS — U071 COVID-19: Secondary | ICD-10-CM | POA: Diagnosis not present

## 2020-11-14 DIAGNOSIS — U071 COVID-19: Secondary | ICD-10-CM | POA: Diagnosis not present

## 2020-11-14 DIAGNOSIS — Z20828 Contact with and (suspected) exposure to other viral communicable diseases: Secondary | ICD-10-CM | POA: Diagnosis not present

## 2020-11-16 DIAGNOSIS — U071 COVID-19: Secondary | ICD-10-CM | POA: Diagnosis not present

## 2020-11-16 DIAGNOSIS — Z20828 Contact with and (suspected) exposure to other viral communicable diseases: Secondary | ICD-10-CM | POA: Diagnosis not present

## 2020-11-21 DIAGNOSIS — Z20828 Contact with and (suspected) exposure to other viral communicable diseases: Secondary | ICD-10-CM | POA: Diagnosis not present

## 2020-11-21 DIAGNOSIS — U071 COVID-19: Secondary | ICD-10-CM | POA: Diagnosis not present

## 2020-11-23 DIAGNOSIS — U071 COVID-19: Secondary | ICD-10-CM | POA: Diagnosis not present

## 2020-11-23 DIAGNOSIS — Z20828 Contact with and (suspected) exposure to other viral communicable diseases: Secondary | ICD-10-CM | POA: Diagnosis not present

## 2020-11-28 DIAGNOSIS — J209 Acute bronchitis, unspecified: Secondary | ICD-10-CM | POA: Diagnosis not present

## 2020-11-28 DIAGNOSIS — U071 COVID-19: Secondary | ICD-10-CM | POA: Diagnosis not present

## 2020-11-28 DIAGNOSIS — K219 Gastro-esophageal reflux disease without esophagitis: Secondary | ICD-10-CM | POA: Diagnosis not present

## 2020-11-28 DIAGNOSIS — M810 Age-related osteoporosis without current pathological fracture: Secondary | ICD-10-CM | POA: Diagnosis not present

## 2020-11-28 DIAGNOSIS — Z6827 Body mass index (BMI) 27.0-27.9, adult: Secondary | ICD-10-CM | POA: Diagnosis not present

## 2020-11-28 DIAGNOSIS — Z20828 Contact with and (suspected) exposure to other viral communicable diseases: Secondary | ICD-10-CM | POA: Diagnosis not present

## 2020-11-30 DIAGNOSIS — Z20828 Contact with and (suspected) exposure to other viral communicable diseases: Secondary | ICD-10-CM | POA: Diagnosis not present

## 2020-11-30 DIAGNOSIS — U071 COVID-19: Secondary | ICD-10-CM | POA: Diagnosis not present

## 2020-12-05 DIAGNOSIS — Z20828 Contact with and (suspected) exposure to other viral communicable diseases: Secondary | ICD-10-CM | POA: Diagnosis not present

## 2020-12-05 DIAGNOSIS — U071 COVID-19: Secondary | ICD-10-CM | POA: Diagnosis not present

## 2020-12-07 DIAGNOSIS — Z20828 Contact with and (suspected) exposure to other viral communicable diseases: Secondary | ICD-10-CM | POA: Diagnosis not present

## 2020-12-07 DIAGNOSIS — U071 COVID-19: Secondary | ICD-10-CM | POA: Diagnosis not present

## 2020-12-12 DIAGNOSIS — Z20828 Contact with and (suspected) exposure to other viral communicable diseases: Secondary | ICD-10-CM | POA: Diagnosis not present

## 2020-12-12 DIAGNOSIS — U071 COVID-19: Secondary | ICD-10-CM | POA: Diagnosis not present

## 2020-12-19 DIAGNOSIS — Z20828 Contact with and (suspected) exposure to other viral communicable diseases: Secondary | ICD-10-CM | POA: Diagnosis not present

## 2020-12-19 DIAGNOSIS — U071 COVID-19: Secondary | ICD-10-CM | POA: Diagnosis not present

## 2020-12-26 DIAGNOSIS — Z20828 Contact with and (suspected) exposure to other viral communicable diseases: Secondary | ICD-10-CM | POA: Diagnosis not present

## 2020-12-26 DIAGNOSIS — U071 COVID-19: Secondary | ICD-10-CM | POA: Diagnosis not present

## 2021-01-04 DIAGNOSIS — U071 COVID-19: Secondary | ICD-10-CM | POA: Diagnosis not present

## 2021-01-04 DIAGNOSIS — Z20828 Contact with and (suspected) exposure to other viral communicable diseases: Secondary | ICD-10-CM | POA: Diagnosis not present

## 2021-02-12 ENCOUNTER — Emergency Department (HOSPITAL_COMMUNITY): Payer: Medicare HMO

## 2021-02-12 ENCOUNTER — Other Ambulatory Visit: Payer: Self-pay

## 2021-02-12 ENCOUNTER — Encounter (HOSPITAL_COMMUNITY): Payer: Self-pay

## 2021-02-12 ENCOUNTER — Emergency Department (HOSPITAL_COMMUNITY)
Admission: EM | Admit: 2021-02-12 | Discharge: 2021-02-12 | Disposition: A | Discharge status: Home / Self Care | Payer: Medicare HMO | Attending: Emergency Medicine | Admitting: Emergency Medicine

## 2021-02-12 DIAGNOSIS — I10 Essential (primary) hypertension: Secondary | ICD-10-CM | POA: Diagnosis not present

## 2021-02-12 DIAGNOSIS — R059 Cough, unspecified: Secondary | ICD-10-CM | POA: Diagnosis not present

## 2021-02-12 DIAGNOSIS — W010XXA Fall on same level from slipping, tripping and stumbling without subsequent striking against object, initial encounter: Secondary | ICD-10-CM

## 2021-02-12 DIAGNOSIS — W19XXXA Unspecified fall, initial encounter: Secondary | ICD-10-CM | POA: Insufficient documentation

## 2021-02-12 DIAGNOSIS — R42 Dizziness and giddiness: Secondary | ICD-10-CM | POA: Insufficient documentation

## 2021-02-12 DIAGNOSIS — R109 Unspecified abdominal pain: Secondary | ICD-10-CM | POA: Diagnosis present

## 2021-02-12 DIAGNOSIS — R112 Nausea with vomiting, unspecified: Secondary | ICD-10-CM | POA: Diagnosis not present

## 2021-02-12 DIAGNOSIS — Z79899 Other long term (current) drug therapy: Secondary | ICD-10-CM | POA: Diagnosis not present

## 2021-02-12 DIAGNOSIS — R030 Elevated blood-pressure reading, without diagnosis of hypertension: Secondary | ICD-10-CM

## 2021-02-12 HISTORY — DX: Depression, unspecified: F32.A

## 2021-02-12 HISTORY — DX: Other amnesia: R41.3

## 2021-02-12 HISTORY — DX: Anxiety disorder, unspecified: F41.9

## 2021-02-12 HISTORY — DX: Essential (primary) hypertension: I10

## 2021-02-12 HISTORY — DX: Hyperlipidemia, unspecified: E78.5

## 2021-02-12 LAB — URINALYSIS, ROUTINE W REFLEX MICROSCOPIC
Bilirubin Urine: NEGATIVE
Glucose, UA: NEGATIVE mg/dL
Ketones, ur: NEGATIVE mg/dL
Leukocytes,Ua: NEGATIVE
Nitrite: NEGATIVE
Protein, ur: NEGATIVE mg/dL
Specific Gravity, Urine: 1.01 (ref 1.005–1.030)
pH: 6 (ref 5.0–8.0)

## 2021-02-12 LAB — COMPREHENSIVE METABOLIC PANEL
ALT: 12 U/L (ref 0–44)
AST: 19 U/L (ref 15–41)
Albumin: 3.5 g/dL (ref 3.5–5.0)
Alkaline Phosphatase: 56 U/L (ref 38–126)
Anion gap: 8 (ref 5–15)
BUN: 9 mg/dL (ref 8–23)
CO2: 28 mmol/L (ref 22–32)
Calcium: 8.9 mg/dL (ref 8.9–10.3)
Chloride: 99 mmol/L (ref 98–111)
Creatinine, Ser: 0.94 mg/dL (ref 0.44–1.00)
GFR, Estimated: 57 mL/min — ABNORMAL LOW (ref >60)
Glucose, Bld: 131 mg/dL — ABNORMAL HIGH (ref 70–99)
Potassium: 3.7 mmol/L (ref 3.5–5.1)
Sodium: 135 mmol/L (ref 135–145)
Total Bilirubin: 0.6 mg/dL (ref 0.3–1.2)
Total Protein: 6.8 g/dL (ref 6.5–8.1)

## 2021-02-12 LAB — CBC
HCT: 43.7 % (ref 36.0–46.0)
Hemoglobin: 13.9 g/dL (ref 12.0–15.0)
MCH: 27.4 pg (ref 26.0–34.0)
MCHC: 31.8 g/dL (ref 30.0–36.0)
MCV: 86 fL (ref 80.0–100.0)
Platelets: 211 10*3/uL (ref 150–400)
RBC: 5.08 MIL/uL (ref 3.87–5.11)
RDW: 13.2 % (ref 11.5–15.5)
WBC: 5.8 10*3/uL (ref 4.0–10.5)
nRBC: 0 % (ref 0.0–0.2)

## 2021-02-12 NOTE — ED Triage Notes (Signed)
Pt brought to ED via [facility] for a fall due to vomiting and abdominal pain. Patient's roommate witnessed her fall. Patient does not complain of pain from the fall but states N/V with 3 episodes of vomiting today. Patient given 4mg  zofran en route.

## 2021-02-12 NOTE — ED Notes (Signed)
Prior to in/out cath, patient had one episode of stool incontinence. Patient cleansed and provided with new brief. Patient verbalized to this nurse to throw her underwear away.

## 2021-02-12 NOTE — ED Provider Notes (Addendum)
[facility] EMERGENCY DEPARTMENT Provider Note   CSN: [id number] Arrival date & time: 02/12/21  1234     History Chief Complaint  Patient presents with  . Fall  . Abdominal Pain    Lauraine RinneReva E Llorens is a 87 y.o. female.  [facility], pt reports a few episodes of nausea/vomiting in past 1-2 days. Denies bloody or bilious emesis. No abd distension. Denies constipation or diarrhea. No fever or chills. No known ill contacts. Patient also noted with fall earlier - pt denies loc, pain or injury. No headache. No neck or back pain. Pt limited historian - level 5 caveat. Pt denies chest pain or sob. Occasional non prod cough, no other uri symptoms. No fever or chills. No abd pain. No dysuria or gu c/o. EMS gave patient zofran en route to ED.   The history is provided by the patient and the EMS personnel. The history is limited by the condition of the patient.  Fall Associated symptoms include abdominal pain. Pertinent negatives include no chest pain, no headaches and no shortness of breath.  Abdominal Pain Associated symptoms: cough, nausea and vomiting   Associated symptoms: no chest pain, no chills, no dysuria, no fever, no shortness of breath and no sore throat        Past Medical History:  Diagnosis Date  . Anxiety   . Depression   . Hyperlipidemia   . Hypertension   . Memory loss     There are no problems to display for this patient.   History reviewed. No pertinent surgical history.   OB History   No obstetric history on file.     History reviewed. No pertinent family history.     Home Medications Prior to Admission medications   Medication Sig Start Date End Date Taking? Authorizing Provider  alendronate (FOSAMAX) 70 MG tablet Take by mouth. 08/28/20   [provider]  benzonatate (TESSALON) 200 MG capsule Take by mouth. 11/07/20   [provider]  calcium-vitamin D (OSCAL WITH D) 500-200 MG-UNIT TABS tablet Take by mouth.     [provider]  Cholecalciferol 50 MCG (2000 UT) CAPS Take by mouth.    [provider]  lisinopril (ZESTRIL) 20 MG tablet Take 1 tablet by mouth daily. 11/21/20   [provider]  losartan (COZAAR) 50 MG tablet Take 1 tablet by mouth daily. 01/11/21   [provider]  omeprazole (PRILOSEC) 20 MG capsule Take 1 capsule by mouth daily. 02/11/21   [provider]    Allergies    Penicillins  Review of Systems   Review of Systems  Constitutional: Negative for chills and fever.  HENT: Negative for sore throat.   Eyes: Negative for redness.  Respiratory: Positive for cough. Negative for shortness of breath.   Cardiovascular: Negative for chest pain.  Gastrointestinal: Positive for abdominal pain, nausea and vomiting.  Genitourinary: Negative for dysuria and flank pain.  Musculoskeletal: Negative for back pain and neck pain.  Skin: Negative for wound.  Neurological: Negative for weakness, numbness and headaches.  Hematological: Does not bruise/bleed easily.  Psychiatric/Behavioral: Negative for agitation.    Physical Exam Updated Vital Signs BP (!) 147/84   Pulse 70   Temp 98.3 F (36.8 C) (Oral)   Resp (!) 24   Ht 1.575 m (5\' 2" )   Wt 59 kg   SpO2 93%   BMI 23.78 kg/m   Physical Exam Vitals and nursing note reviewed.  Constitutional:  Appearance: Normal appearance. She is well-developed.  HENT:     Head: Atraumatic.     Nose: Nose normal.     Mouth/Throat:     Mouth: Mucous membranes are moist.  Eyes:     General: No scleral icterus.    Conjunctiva/sclera: Conjunctivae normal.     Pupils: Pupils are equal, round, and reactive to light.  Neck:     Vascular: No carotid bruit.     Trachea: No tracheal deviation.     Comments: No stiffness or rigidity.  Good rom without pain.  Cardiovascular:     Rate and Rhythm: Normal rate and regular rhythm.     Pulses: Normal pulses.     Heart sounds: Normal heart sounds. No murmur  heard. No friction rub. No gallop.   Pulmonary:     Effort: Pulmonary effort is normal. No respiratory distress.     Breath sounds: Normal breath sounds.  Abdominal:     General: Bowel sounds are normal. There is no distension.     Palpations: Abdomen is soft.     Tenderness: There is no abdominal tenderness. There is no guarding.  Genitourinary:    Comments: No cva tenderness.  Musculoskeletal:        General: No swelling or tenderness.     Cervical back: Normal range of motion and neck supple. No rigidity or tenderness. No muscular tenderness.     Comments: CTLS spine, non tender, aligned, no step off.' Good rom bil extremities without pain or focal bony tenderness.   Skin:    General: Skin is warm and dry.     Findings: No rash.  Neurological:     Mental Status: She is alert.     Comments: Alert, speech normal. GCS 15. Motor/sens grossly intact bil.   Psychiatric:        Mood and Affect: Mood normal.     ED Results / Procedures / Treatments   Labs (all labs ordered are listed, but only abnormal results are displayed) Results for orders placed or performed during the hospital encounter of 02/12/21  CBC  Result Value Ref Range   WBC 5.8 4.0 - 10.5 K/uL   RBC 5.08 3.87 - 5.11 MIL/uL   Hemoglobin 13.9 12.0 - 15.0 g/dL   HCT 60.6 30.1 - 60.1 %   MCV 86.0 80.0 - 100.0 fL   MCH 27.4 26.0 - 34.0 pg   MCHC 31.8 30.0 - 36.0 g/dL   RDW 09.3 23.5 - 57.3 %   Platelets 211 150 - 400 K/uL   nRBC 0.0 0.0 - 0.2 %  Comprehensive metabolic panel  Result Value Ref Range   Sodium 135 135 - 145 mmol/L   Potassium 3.7 3.5 - 5.1 mmol/L   Chloride 99 98 - 111 mmol/L   CO2 28 22 - 32 mmol/L   Glucose, Bld 131 (H) 70 - 99 mg/dL   BUN 9 8 - 23 mg/dL   Creatinine, Ser 2.20 0.44 - 1.00 mg/dL   Calcium 8.9 8.9 - 25.4 mg/dL   Total Protein 6.8 6.5 - 8.1 g/dL   Albumin 3.5 3.5 - 5.0 g/dL   AST 19 15 - 41 U/L   ALT 12 0 - 44 U/L   Alkaline Phosphatase 56 38 - 126 U/L   Total Bilirubin 0.6  0.3 - 1.2 mg/dL   GFR, Estimated 57 (L) >60 mL/min   Anion gap 8 5 - 15   CT HEAD WO CONTRAST  Result Date: 02/12/2021 CLINICAL DATA:  Lightheadedness.  Memory loss. EXAM: CT HEAD WITHOUT CONTRAST TECHNIQUE: Contiguous axial images were obtained from the base of the skull through the vertex without intravenous contrast. COMPARISON:  None. FINDINGS: Brain: No evidence of acute infarction, hemorrhage, hydrocephalus, extra-axial collection or mass lesion/mass effect. There is some atrophy and chronic microvascular ischemic change. Vascular: No hyperdense vessel or unexpected calcification. Skull: Intact.  No focal lesion. Sinuses/Orbits: Status post cataract surgery.  Otherwise negative. Other: None. IMPRESSION: No acute abnormality. Atrophy and chronic microvascular ischemic change. Electronically Signed   By: Drusilla Kanner M.D.   On: 02/12/2021 14:27   DG ABD ACUTE 2+V W 1V CHEST  Result Date: 02/12/2021 CLINICAL DATA:  Nausea vomiting and fall. EXAM: DG ABDOMEN ACUTE WITH 1 VIEW CHEST COMPARISON:  March 09, 2020 FINDINGS: Cardiomediastinal contours are stable. Lungs are clear aside from basilar atelectasis. No sign of effusion. Large hiatal hernia posterior to the cardiac silhouette. This may be larger than on the prior study. No free air beneath either the RIGHT or LEFT hemidiaphragm. Bowel gas pattern with gas-filled loops of bowel throughout the abdomen. No dilation to suggest obstruction. Stool and gas scattered about the colon to the level of the rectum. EKG leads project over the chest. Regional skeletal structures with degenerative scoliotic curvature of the spine. No acute skeletal process on limited assessment. IMPRESSION: 1. No acute cardiopulmonary disease. 2. Large hiatal hernia posterior to the cardiac silhouette, potentially increased in size when compared to previous imaging. 3. Nonobstructive bowel gas pattern, gas-filled loops of small bowel may reflect diffuse ileus but there is no  significant dilation. Correlate with any signs of gastroenteritis. Electronically Signed   By: Donzetta Kohut M.D.   On: 02/12/2021 14:42    EKG EKG Interpretation  Date/Time:  Monday Feb 12 2021 14:12:00 EDT Ventricular Rate:  67 PR Interval:  173 QRS Duration: 90 QT Interval:  408 QTC Calculation: 431 R Axis:   -31 Text Interpretation: Sinus rhythm LVH with secondary repolarization abnormality Nonspecific T wave abnormality No previous tracing Confirmed by Cathren Laine ([id number]) on 02/12/2021 2:29:21 PM   Radiology CT HEAD WO CONTRAST  Result Date: 02/12/2021 CLINICAL DATA:  Lightheadedness.  Memory loss. EXAM: CT HEAD WITHOUT CONTRAST TECHNIQUE: Contiguous axial images were obtained from the base of the skull through the vertex without intravenous contrast. COMPARISON:  None. FINDINGS: Brain: No evidence of acute infarction, hemorrhage, hydrocephalus, extra-axial collection or mass lesion/mass effect. There is some atrophy and chronic microvascular ischemic change. Vascular: No hyperdense vessel or unexpected calcification. Skull: Intact.  No focal lesion. Sinuses/Orbits: Status post cataract surgery.  Otherwise negative. Other: None. IMPRESSION: No acute abnormality. Atrophy and chronic microvascular ischemic change. Electronically Signed   By: Drusilla Kanner M.D.   On: 02/12/2021 14:27   DG ABD ACUTE 2+V W 1V CHEST  Result Date: 02/12/2021 CLINICAL DATA:  Nausea vomiting and fall. EXAM: DG ABDOMEN ACUTE WITH 1 VIEW CHEST COMPARISON:  March 09, 2020 FINDINGS: Cardiomediastinal contours are stable. Lungs are clear aside from basilar atelectasis. No sign of effusion. Large hiatal hernia posterior to the cardiac silhouette. This may be larger than on the prior study. No free air beneath either the RIGHT or LEFT hemidiaphragm. Bowel gas pattern with gas-filled loops of bowel throughout the abdomen. No dilation to suggest obstruction. Stool and gas scattered about the colon to the level of the  rectum. EKG leads project over the chest. Regional skeletal structures with degenerative scoliotic curvature of the spine. No acute skeletal process on limited  assessment. IMPRESSION: 1. No acute cardiopulmonary disease. 2. Large hiatal hernia posterior to the cardiac silhouette, potentially increased in size when compared to previous imaging. 3. Nonobstructive bowel gas pattern, gas-filled loops of small bowel may reflect diffuse ileus but there is no significant dilation. Correlate with any signs of gastroenteritis. Electronically Signed   By: Donzetta Kohut M.D.   On: 02/12/2021 14:42    Procedures Procedures   Medications Ordered in ED Medications - No data to display  ED Course  I have reviewed the triage vital signs and the nursing notes.  Pertinent labs & imaging results that were available during my care of the patient were reviewed by me and considered in my medical decision making (see chart for details).    MDM Rules/Calculators/A&P                         Labs and imaging ordered.  Reviewed nursing notes and prior charts for additional history. .  Labs reviewed/interpreted by me - wbc normal, chem normal.  Xrays reviewed/interpreted by me - no sbo.   CT reviewed/interpreted by me - no hem.  Po fluids/food. No recurrent nausea or vomiting. No abd pain. Abd soft nt.   1530 UA remains pending.  UA resulted, no uti.   Pt tolerating po. Abd soft non tender. No pain. CTLS spine, non tender, aligned, no step off.  Pt currently appears stable for d/c.   rec pcp f/u. Return precautions provided.        Final Clinical Impression(s) / ED Diagnoses Final diagnoses:  None    Rx / DC Orders ED Discharge Orders    None         Cathren Laine, MD 02/12/21 1615

## 2021-02-12 NOTE — ED Notes (Signed)
Report called to April, RN at [facility]. Patient updated on discharge information and voices no complaints at this time.

## 2021-02-12 NOTE — Discharge Instructions (Addendum)
It was our pleasure to provide your ER care today - we hope that you feel better.  Drink plenty of fluids/stay well hydrated.   Fall precautions.  Follow up with primary care doctor in the coming week - also have blood pressure rechecked then as it is a bit high today.   Return to ER if worse, new symptoms, fevers, new or severe pain, chest pain, trouble breathing, weak/faint, persistent vomiting, severe abdominal pain, or other concern.

## 2021-02-26 ENCOUNTER — Emergency Department (HOSPITAL_COMMUNITY): Payer: Medicare HMO

## 2021-02-26 ENCOUNTER — Emergency Department (HOSPITAL_COMMUNITY)
Admission: EM | Admit: 2021-02-26 | Discharge: 2021-02-26 | Disposition: A | Discharge status: Home / Self Care | Payer: Medicare HMO | Attending: Emergency Medicine | Admitting: Emergency Medicine

## 2021-02-26 ENCOUNTER — Other Ambulatory Visit: Payer: Self-pay

## 2021-02-26 ENCOUNTER — Encounter (HOSPITAL_COMMUNITY): Payer: Self-pay | Admitting: Emergency Medicine

## 2021-02-26 DIAGNOSIS — Z79899 Other long term (current) drug therapy: Secondary | ICD-10-CM | POA: Diagnosis not present

## 2021-02-26 DIAGNOSIS — I10 Essential (primary) hypertension: Secondary | ICD-10-CM | POA: Diagnosis not present

## 2021-02-26 DIAGNOSIS — R42 Dizziness and giddiness: Secondary | ICD-10-CM | POA: Diagnosis present

## 2021-02-26 LAB — CBC WITH DIFFERENTIAL/PLATELET
Abs Immature Granulocytes: 0.02 10*3/uL (ref 0.00–0.07)
Basophils Absolute: 0.1 10*3/uL (ref 0.0–0.1)
Basophils Relative: 1 %
Eosinophils Absolute: 0.1 10*3/uL (ref 0.0–0.5)
Eosinophils Relative: 1 %
HCT: 46.2 % — ABNORMAL HIGH (ref 36.0–46.0)
Hemoglobin: 14.9 g/dL (ref 12.0–15.0)
Immature Granulocytes: 0 %
Lymphocytes Relative: 25 %
Lymphs Abs: 2.4 10*3/uL (ref 0.7–4.0)
MCH: 27.6 pg (ref 26.0–34.0)
MCHC: 32.3 g/dL (ref 30.0–36.0)
MCV: 85.7 fL (ref 80.0–100.0)
Monocytes Absolute: 0.6 10*3/uL (ref 0.1–1.0)
Monocytes Relative: 7 %
Neutro Abs: 6.3 10*3/uL (ref 1.7–7.7)
Neutrophils Relative %: 66 %
Platelets: 466 10*3/uL — ABNORMAL HIGH (ref 150–400)
RBC: 5.39 MIL/uL — ABNORMAL HIGH (ref 3.87–5.11)
RDW: 13.2 % (ref 11.5–15.5)
WBC: 9.4 10*3/uL (ref 4.0–10.5)
nRBC: 0 % (ref 0.0–0.2)

## 2021-02-26 LAB — COMPREHENSIVE METABOLIC PANEL
ALT: 14 U/L (ref 0–44)
AST: 22 U/L (ref 15–41)
Albumin: 3.7 g/dL (ref 3.5–5.0)
Alkaline Phosphatase: 68 U/L (ref 38–126)
Anion gap: 10 (ref 5–15)
BUN: 9 mg/dL (ref 8–23)
CO2: 27 mmol/L (ref 22–32)
Calcium: 8.9 mg/dL (ref 8.9–10.3)
Chloride: 102 mmol/L (ref 98–111)
Creatinine, Ser: 0.68 mg/dL (ref 0.44–1.00)
GFR, Estimated: >60 mL/min (ref >60)
Glucose, Bld: 100 mg/dL — ABNORMAL HIGH (ref 70–99)
Potassium: 3.5 mmol/L (ref 3.5–5.1)
Sodium: 139 mmol/L (ref 135–145)
Total Bilirubin: 1 mg/dL (ref 0.3–1.2)
Total Protein: 7.6 g/dL (ref 6.5–8.1)

## 2021-02-26 LAB — URINALYSIS, ROUTINE W REFLEX MICROSCOPIC
Bacteria, UA: NONE SEEN
Bilirubin Urine: NEGATIVE
Glucose, UA: NEGATIVE mg/dL
Ketones, ur: 5 mg/dL — AB
Leukocytes,Ua: NEGATIVE
Nitrite: NEGATIVE
Protein, ur: NEGATIVE mg/dL
Specific Gravity, Urine: 1.008 (ref 1.005–1.030)
pH: 7 (ref 5.0–8.0)

## 2021-02-26 MED ORDER — MECLIZINE HCL 25 MG PO TABS: 25.0000 mg | ORAL_TABLET | Freq: Three times a day (TID) | ORAL | 0 refills | Status: AC | PRN

## 2021-02-26 MED ORDER — MECLIZINE HCL 12.5 MG PO TABS
25.0000 mg | ORAL_TABLET | Freq: Once | ORAL | Status: AC
  Administered 2021-02-26: 25 mg via ORAL
  Filled 2021-02-26: qty 2

## 2021-02-26 NOTE — ED Notes (Signed)
Unable to get mse signature d/t not having a signature pad

## 2021-02-26 NOTE — ED Notes (Signed)
Pt ambulated well with walker and assistance. EDP notified.

## 2021-02-26 NOTE — ED Triage Notes (Signed)
Pt brought by [facility]. Pt is from [facility]. Per EMS pt feels dizzy when she sits up.

## 2021-02-26 NOTE — ED Notes (Signed)
Pt states she is unable to stand to do orthostatics. PA made aware. Lying and and sitting vitals completed.

## 2021-02-26 NOTE — ED Notes (Signed)
Pt legal guardian is donna graves. The phone number to get in touch with [name] is [phone]. The on call number for after hours is [phone] which directs you to the [place] [employer] office for the on call social worker.

## 2021-02-26 NOTE — ED Provider Notes (Signed)
[facility] EMERGENCY DEPARTMENT Provider Note   CSN: [id number] Arrival date & time: 02/26/21  1156     History Chief Complaint  Patient presents with  . Dizziness    Amy Calderon is a 87 y.o. female presenting for evaluation of dizziness.  Patient states yesterday she developed dizziness.  It began last night.  She cannot state exactly when.  She states dizziness is worse when she turns her head from side to side or when she goes from sitting to standing.  She states she is unable to stand now due to dizziness.  She denies history of similar.  She denies fevers, headache, chest pain, shortness breath, cough, nausea, vomit, abdominal pain, urinary symptoms, normal bowel movements.  She denies recent medication changes.  She was feeling well yesterday morning.  She is unable to describe the dizziness in any way.   HPI     Past Medical History:  Diagnosis Date  . Anxiety   . Depression   . Hyperlipidemia   . Hypertension   . Memory loss     There are no problems to display for this patient.   History reviewed. No pertinent surgical history.   OB History   No obstetric history on file.     History reviewed. No pertinent family history.     Home Medications Prior to Admission medications   Medication Sig Start Date End Date Taking? Authorizing Provider  alendronate (FOSAMAX) 70 MG tablet Take 70 mg by mouth once a week. Mondays 08/28/20  Yes [provider]  calcium-vitamin D (OSCAL WITH D) 500-200 MG-UNIT TABS tablet Take 1 tablet by mouth 3 (three) times daily.   Yes [provider]  Cholecalciferol 50 MCG (2000 UT) CAPS Take 1 capsule by mouth daily.   Yes [provider]  dextromethorphan-guaiFENesin (MUCINEX DM) 30-600 MG 12hr tablet Take 1 tablet by mouth 2 (two) times daily.   Yes [provider]  doxycycline (VIBRA-TABS) 100 MG tablet Take 100 mg by mouth 2 (two) times daily. For 7 days 02/20/21  Yes [provider]   losartan (COZAAR) 50 MG tablet Take 1 tablet by mouth daily. 01/11/21  Yes [provider]  meclizine (ANTIVERT) 25 MG tablet Take 1 tablet (25 mg total) by mouth 3 (three) times daily as needed for dizziness. 02/26/21  Yes Camil Hausmann, PA-C  omeprazole (PRILOSEC) 20 MG capsule Take 1 capsule by mouth daily. 02/11/21  Yes [provider]  lisinopril (ZESTRIL) 20 MG tablet Take 20 mg by mouth daily. Patient not taking: No sig reported    [provider]    Allergies    Penicillins  Review of Systems   Review of Systems  Neurological: Positive for dizziness.  All other systems reviewed and are negative.   Physical Exam Updated Vital Signs BP (!) 179/68   Pulse 63   Temp 98.8 F (37.1 C) (Oral)   Resp (!) 27   Ht 5\' 2"  (1.575 m)   Wt 60 kg   SpO2 96%   BMI 24.19 kg/m   Physical Exam Vitals and nursing note reviewed.  Constitutional:      General: She is not in acute distress.    Appearance: She is well-developed.     Comments: Resting comfortably in bed in no acute distress, very hard of hearing, making exam difficult  HENT:     Head: Normocephalic and atraumatic.  Eyes:     Extraocular Movements: Extraocular movements intact.     Conjunctiva/sclera:  Conjunctivae normal.     Pupils: Pupils are equal, round, and reactive to light.  Cardiovascular:     Rate and Rhythm: Normal rate and regular rhythm.     Pulses: Normal pulses.  Pulmonary:     Effort: Pulmonary effort is normal. No respiratory distress.     Breath sounds: Normal breath sounds. No wheezing.  Abdominal:     General: There is no distension.     Palpations: Abdomen is soft. There is no mass.     Tenderness: There is no abdominal tenderness. There is no guarding or rebound.  Musculoskeletal:        General: Normal range of motion.     Cervical back: Normal range of motion and neck supple.  Skin:    General: Skin is warm and dry.     Capillary Refill: Capillary refill takes  less than 2 seconds.  Neurological:     Mental Status: She is alert and oriented to person, place, and time.     GCS: GCS eye subscore is 4. GCS verbal subscore is 5. GCS motor subscore is 6.     Sensory: Sensation is intact.     Motor: Motor function is intact.     Coordination: Coordination is intact.     Comments: CN intact.  Nose to finger intact.  Strength and sensation intact x4.  Unable to assess visual fields as patient cannot understand/hear/follow the directions     ED Results / Procedures / Treatments   Labs (all labs ordered are listed, but only abnormal results are displayed) Labs Reviewed  CBC WITH DIFFERENTIAL/PLATELET - Abnormal; Notable for the following components:      Result Value   RBC 5.39 (*)    HCT 46.2 (*)    Platelets 466 (*)    All other components within normal limits  COMPREHENSIVE METABOLIC PANEL - Abnormal; Notable for the following components:   Glucose, Bld 100 (*)    All other components within normal limits  URINALYSIS, ROUTINE W REFLEX MICROSCOPIC - Abnormal; Notable for the following components:   Hgb urine dipstick MODERATE (*)    Ketones, ur 5 (*)    All other components within normal limits    EKG None  Radiology CT Head Wo Contrast  Result Date: 02/26/2021 CLINICAL DATA:  Dizziness when sitting up, unable to walk due to dizziness, hypertension, neurological deficit question stroke EXAM: CT HEAD WITHOUT CONTRAST TECHNIQUE: Contiguous axial images were obtained from the base of the skull through the vertex without intravenous contrast. COMPARISON:  02/12/2021 FINDINGS: Brain: Generalized atrophy. Mild ex vacuo dilatation of the ventricular system. No midline shift or mass effect. Small vessel chronic ischemic changes of deep cerebral white matter. No intracranial hemorrhage, mass lesion, or evidence of acute infarction. No extra-axial fluid collections. Vascular: Atherosclerotic calcification of internal carotid and vertebral arteries at  skull base. Skull: Intact Sinuses/Orbits: Clear Other: N/A IMPRESSION: Atrophy with small vessel chronic ischemic changes of deep cerebral white matter. No acute intracranial abnormalities. Electronically Signed   By: Ulyses Southward M.D.   On: 02/26/2021 14:49   MR BRAIN WO CONTRAST  Result Date: 02/26/2021 CLINICAL DATA:  Neuro deficit, acute stroke suspected; dizziness EXAM: MRI HEAD WITHOUT CONTRAST TECHNIQUE: Multiplanar, multiecho pulse sequences of the brain and surrounding structures were obtained without intravenous contrast. COMPARISON:  None. FINDINGS: Brain: There is no acute infarction or intracranial hemorrhage. There is no intracranial mass, mass effect, or edema. There is no extra-axial fluid collection. Prominence of the ventricles  and sulci reflects generalized parenchymal volume loss. Patchy T2 hyperintensity in the supratentorial white matter is nonspecific but may reflect mild chronic microvascular ischemic changes. Vascular: Major vessel flow voids at the skull base are preserved. Skull and upper cervical spine: Normal marrow signal is preserved. Sinuses/Orbits: Minor mucosal thickening. Bilateral lens replacement. Other: Sella is unremarkable. Minor left mastoid fluid opacification. IMPRESSION: No evidence of recent infarction, hemorrhage, or mass. Chronic microvascular ischemic changes. Electronically Signed   By: Guadlupe Spanish M.D.   On: 02/26/2021 18:57    Procedures Procedures   Medications Ordered in ED Medications  meclizine (ANTIVERT) tablet 25 mg (25 mg Oral Given 02/26/21 1547)    ED Course  I have reviewed the triage vital signs and the nursing notes.  Pertinent labs & imaging results that were available during my care of the patient were reviewed by me and considered in my medical decision making (see chart for details).    MDM Rules/Calculators/A&P                           Patient presented for evaluation of dizziness.  Symptoms are worse when she moves her  head to questions and understanding, as such, likely peripheral cause.  However considering patient's age and due to her heart being hard of hearing, it is difficult to obtain an accurate history, also consider stroke versus TIA.  Consider metabolic abnormality.  Consider infection.  Will obtain labs, CT head, urine.   Labs interpreted by me, overall reassuring.  Urine without signs of infection.  CT head negative for acute findings.  On reevaluation, patient continues to have dizziness, MRI ordered.  MRI negative for acute findings such as stroke.  After meclizine, patient reports improvement of dizziness.  She is able to ambulate with walker without difficulty. At this time, pt appears safe for d/c. return precautions given. Pt states she understands and agrees to plan.   Final Clinical Impression(s) / ED Diagnoses Final diagnoses:  Vertigo    Rx / DC Orders ED Discharge Orders         Ordered    meclizine (ANTIVERT) 25 MG tablet  3 times daily PRN        02/26/21 1956           Alveria Apley, PA-C 02/26/21 2002    Vanetta Mulders, MD 03/03/21 [phone]

## 2021-02-26 NOTE — ED Notes (Signed)
Pt to MRI at this time.

## 2021-02-26 NOTE — Discharge Instructions (Addendum)
Your work-up today in the ER was overall reassuring.  Your MRI was negative for stroke.  Your CT head did not show anything abnormal, and your labs are overall reassuring. You likely have something called vertigo.  This may be exacerbated due to your recent illness. Take meclizine up to 3 times a day as needed for dizziness.  Make sure you are staying well-hydrated with water while taking this medicine. Follow-up with your primary care doctor for recheck of your symptoms in 3 days. Return to the emergency room with any new, worsening, concerning symptoms.

## 2021-06-03 ENCOUNTER — Emergency Department (HOSPITAL_COMMUNITY): Payer: Medicare HMO

## 2021-06-03 ENCOUNTER — Encounter (HOSPITAL_COMMUNITY): Payer: Self-pay

## 2021-06-03 ENCOUNTER — Other Ambulatory Visit: Payer: Self-pay

## 2021-06-03 ENCOUNTER — Emergency Department (HOSPITAL_BASED_OUTPATIENT_CLINIC_OR_DEPARTMENT_OTHER): Payer: Medicare HMO

## 2021-06-03 ENCOUNTER — Emergency Department (HOSPITAL_COMMUNITY)
Admission: EM | Admit: 2021-06-03 | Discharge: 2021-06-03 | Disposition: A | Discharge status: Skilled Nursing Facility | Payer: Medicare HMO | Attending: Emergency Medicine | Admitting: Emergency Medicine

## 2021-06-03 DIAGNOSIS — R42 Dizziness and giddiness: Secondary | ICD-10-CM | POA: Insufficient documentation

## 2021-06-03 DIAGNOSIS — I10 Essential (primary) hypertension: Secondary | ICD-10-CM | POA: Diagnosis not present

## 2021-06-03 DIAGNOSIS — Z87891 Personal history of nicotine dependence: Secondary | ICD-10-CM | POA: Insufficient documentation

## 2021-06-03 DIAGNOSIS — Z79899 Other long term (current) drug therapy: Secondary | ICD-10-CM | POA: Diagnosis not present

## 2021-06-03 DIAGNOSIS — R0689 Other abnormalities of breathing: Secondary | ICD-10-CM | POA: Diagnosis not present

## 2021-06-03 DIAGNOSIS — R001 Bradycardia, unspecified: Secondary | ICD-10-CM | POA: Diagnosis not present

## 2021-06-03 DIAGNOSIS — R55 Syncope and collapse: Secondary | ICD-10-CM

## 2021-06-03 LAB — ECHOCARDIOGRAM COMPLETE
Area-P 1/2: 4.93 cm2
Height: 63 in
P 1/2 time: 1027 msec
S' Lateral: 2.6 cm
Weight: 2176 oz

## 2021-06-03 LAB — CBC WITH DIFFERENTIAL/PLATELET
Basophils Absolute: 0 10*3/uL (ref 0.0–0.1)
Basophils Relative: 0 %
Eosinophils Absolute: 0.1 10*3/uL (ref 0.0–0.5)
Eosinophils Relative: 2 %
HCT: 42.3 % (ref 36.0–46.0)
Hemoglobin: 13.8 g/dL (ref 12.0–15.0)
Lymphocytes Relative: 40 %
Lymphs Abs: 1.2 10*3/uL (ref 0.7–4.0)
MCH: 28 pg (ref 26.0–34.0)
MCHC: 32.6 g/dL (ref 30.0–36.0)
MCV: 86 fL (ref 80.0–100.0)
Monocytes Absolute: 0.1 10*3/uL (ref 0.1–1.0)
Monocytes Relative: 3 %
Neutro Abs: 1.7 10*3/uL (ref 1.7–7.7)
Neutrophils Relative %: 55 %
Platelets: 193 10*3/uL (ref 150–400)
RBC: 4.92 MIL/uL (ref 3.87–5.11)
RDW: 14 % (ref 11.5–15.5)
WBC: 3 10*3/uL — ABNORMAL LOW (ref 4.0–10.5)
nRBC: 0 % (ref 0.0–0.2)

## 2021-06-03 LAB — BASIC METABOLIC PANEL
Anion gap: 8 (ref 5–15)
BUN: 10 mg/dL (ref 8–23)
CO2: 26 mmol/L (ref 22–32)
Calcium: 8.7 mg/dL — ABNORMAL LOW (ref 8.9–10.3)
Chloride: 99 mmol/L (ref 98–111)
Creatinine, Ser: 0.8 mg/dL (ref 0.44–1.00)
GFR, Estimated: >60 mL/min (ref >60)
Glucose, Bld: 115 mg/dL — ABNORMAL HIGH (ref 70–99)
Potassium: 3.4 mmol/L — ABNORMAL LOW (ref 3.5–5.1)
Sodium: 133 mmol/L — ABNORMAL LOW (ref 135–145)

## 2021-06-03 LAB — CBG MONITORING, ED: Glucose-Capillary: 126 mg/dL — ABNORMAL HIGH (ref 70–99)

## 2021-06-03 LAB — TROPONIN I (HIGH SENSITIVITY)
Troponin I (High Sensitivity): 6 ng/L (ref <18)
Troponin I (High Sensitivity): 6 ng/L (ref <18)

## 2021-06-03 LAB — BRAIN NATRIURETIC PEPTIDE: B Natriuretic Peptide: 86 pg/mL (ref 0.0–100.0)

## 2021-06-03 LAB — MAGNESIUM: Magnesium: 1.8 mg/dL (ref 1.7–2.4)

## 2021-06-03 MED ORDER — POTASSIUM CHLORIDE CRYS ER 20 MEQ PO TBCR
40.0000 meq | EXTENDED_RELEASE_TABLET | Freq: Once | ORAL | Status: AC
  Administered 2021-06-03: 40 meq via ORAL
  Filled 2021-06-03: qty 2

## 2021-06-03 NOTE — ED Triage Notes (Signed)
Pt arrived from [facility] from [facility] facility, was found sitting up in chair with eyes closed and was not responding to any stimuli. Once patient was placed in EMS she was able to follow commands. Had one episode of N/V, pt now complains of feeling dizzy.

## 2021-06-03 NOTE — Progress Notes (Signed)
  Echocardiogram 2D Echocardiogram has been performed.  Amy Calderon 06/03/2021, 4:00 PM

## 2021-06-03 NOTE — ED Provider Notes (Signed)
Patient here for syncope nausea vomiting that is since resolved.  Her work-up has been negative.  She is pending cardiac echo. Physical Exam  BP (!) 166/79   Pulse (!) 57   Temp 98.6 F (37 C) (Oral)   Resp (!) 26   Ht 5\' 3"  (1.6 m)   Wt 61.7 kg   SpO2 96%   BMI 24.09 kg/m   Physical Exam  ED Course/Procedures     Procedures  MDM  Cardiac echo was done and did not show any acute findings.  We will proceed with discharge plans per Dr. .       Wallace Cullens, MD 06/03/21 [phone]

## 2021-06-03 NOTE — ED Notes (Signed)
[facility] called and staff notified of d/c. Staff will be here to pick her up.

## 2021-06-03 NOTE — ED Notes (Addendum)
[facility] DSS called and stated pt is a ward of the state and wanted information. RN requested copy of documentation to allow  [place] DSS information. Documentation faxed over and chart updated.   Letter states that Jeannette How of [facility] DSS is the appointed person.

## 2021-06-03 NOTE — ED Provider Notes (Signed)
[facility] EMERGENCY DEPARTMENT Provider Note   CSN: [id number] Arrival date & time: 06/03/21  5093     History Chief Complaint  Patient presents with   Dizziness    RONESHIA DREW is a 87 y.o. female.  Patient is a 87 yo female presenting from [facility] facility for syncope. Patient, and EMS report, patient was sitting in chair when she became unresponsive, lasting seconds, then resolved. Pt was then reported to have told EMS that she had n/v/dizziness after episode. Currently patient is Aox3, back to baseline, with no complaints of chest pain, sob, nausea, vomiting, dizziness, or abdominal pain.   The history is provided by the patient. No language interpreter was used.  Dizziness Quality:  Lightheadedness Severity:  Moderate Timing:  Constant Associated symptoms: no chest pain, no palpitations, no shortness of breath and no vomiting       Past Medical History:  Diagnosis Date   Anxiety    Depression    Hyperlipidemia    Hypertension    Memory loss     There are no problems to display for this patient.   History reviewed. No pertinent surgical history.   OB History     Gravida  0   Para  0   Term  0   Preterm  0   AB  0   Living  0      SAB  0   IAB  0   Ectopic  0   Multiple  0   Live Births  0           Family History  Family history unknown: Yes    Social History   Tobacco Use   Smoking status: Former    Types: Cigarettes    Quit date: 10/07/1953    Years since quitting: 67.7   Smokeless tobacco: Never  Vaping Use   Vaping Use: Never used  Substance Use Topics   Alcohol use: Never   Drug use: Never    Home Medications Prior to Admission medications   Medication Sig Start Date End Date Taking? Authorizing Provider  alendronate (FOSAMAX) 70 MG tablet Take 70 mg by mouth once a week. Mondays 08/28/20  Yes [provider]  calcium-vitamin D (OSCAL WITH D) 500-200 MG-UNIT TABS tablet Take 1 tablet by mouth  3 (three) times daily.   Yes [provider]  Cholecalciferol 50 MCG (2000 UT) CAPS Take 1 capsule by mouth daily.   Yes [provider]  docusate sodium (COLACE) 100 MG capsule Take 100 mg by mouth daily as needed for mild constipation.   Yes [provider]  losartan (COZAAR) 50 MG tablet Take 1 tablet by mouth daily. 01/11/21  Yes [provider]  meclizine (ANTIVERT) 25 MG tablet Take 1 tablet (25 mg total) by mouth 3 (three) times daily as needed for dizziness. 02/26/21  Yes Caccavale, Sophia, PA-C  omeprazole (PRILOSEC) 20 MG capsule Take 1 capsule by mouth daily. 02/11/21  Yes [provider]  Phenylephrine-DM-GG-APAP (DELSYM COUGH/COLD DAYTIME PO) Take 5 mLs by mouth daily as needed (cough).   Yes [provider]    Allergies    Penicillins  Review of Systems   Review of Systems  Constitutional:  Negative for chills and fever.  HENT:  Negative for ear pain and sore throat.   Eyes:  Negative for pain and visual disturbance.  Respiratory:  Negative for cough and shortness of breath.   Cardiovascular:  Negative for chest pain  and palpitations.  Gastrointestinal:  Negative for abdominal pain and vomiting.  Genitourinary:  Negative for dysuria and hematuria.  Musculoskeletal:  Negative for arthralgias and back pain.  Skin:  Negative for color change and rash.  Neurological:  Positive for dizziness and syncope. Negative for seizures.  All other systems reviewed and are negative.  Physical Exam Updated Vital Signs BP (!) 155/88 (BP Location: Right Arm)   Pulse 61   Temp 98.3 F (36.8 C) (Oral)   Resp 20   Ht  (1.6 m)   Wt 61.7 kg   SpO2 99%   BMI 24.09 kg/m   Physical Exam Vitals and nursing note reviewed.  Constitutional:      General: She is not in acute distress.    Appearance: She is well-developed.  HENT:     Head: Normocephalic and atraumatic.     Comments: Extremely hard of hearing  Eyes:      Conjunctiva/sclera: Conjunctivae normal.  Cardiovascular:     Rate and Rhythm: Regular rhythm. Bradycardia present.     Heart sounds: No murmur heard. Pulmonary:     Effort: Pulmonary effort is normal. No respiratory distress.     Breath sounds: Normal breath sounds.  Abdominal:     Palpations: Abdomen is soft.     Tenderness: There is no abdominal tenderness.  Musculoskeletal:     Cervical back: Neck supple.  Skin:    General: Skin is warm and dry.  Neurological:     Mental Status: She is alert and oriented to person, place, and time. Mental status is at baseline.     GCS: GCS eye subscore is 4. GCS verbal subscore is 5. GCS motor subscore is 6.     ED Results / Procedures / Treatments   Labs (all labs ordered are listed, but only abnormal results are displayed) Labs Reviewed  CBC WITH DIFFERENTIAL/PLATELET - Abnormal; Notable for the following components:      Result Value   WBC 3.0 (*)    All other components within normal limits  BASIC METABOLIC PANEL - Abnormal; Notable for the following components:   Sodium 133 (*)    Potassium 3.4 (*)    Glucose, Bld 115 (*)    Calcium 8.7 (*)    All other components within normal limits  CBG MONITORING, ED - Abnormal; Notable for the following components:   Glucose-Capillary 126 (*)    All other components within normal limits  BRAIN NATRIURETIC PEPTIDE  MAGNESIUM  TROPONIN I (HIGH SENSITIVITY)  TROPONIN I (HIGH SENSITIVITY)    EKG EKG Interpretation  Date/Time:  Sunday June 03 2021 18:24:08 EDT Ventricular Rate:  59 PR Interval:  162 QRS Duration: 80 QT Interval:  413 QTC Calculation: 410 R Axis:   -28 Text Interpretation: Sinus rhythm Consider left ventricular hypertrophy Borderline T abnormalities, lateral leads No significant change since prior 5/22 Confirmed by Meridee Score [phone]) on 06/03/2021 6:28:49 PM  Radiology CT HEAD WO CONTRAST ( )  Result Date: 06/03/2021 CLINICAL DATA:  Minor head trauma. Patient  found sitting in chair with eyes closed, not responding to stimuli. One episode of nausea and vomiting. EXAM: CT HEAD WITHOUT CONTRAST TECHNIQUE: Contiguous axial images were obtained from the base of the skull through the vertex without intravenous contrast. COMPARISON:  Feb 26, 2021 FINDINGS: Brain: No subdural, epidural, or subarachnoid hemorrhage. Ventricles and sulci are prominent but stable. Cerebellum, brainstem, and basal cisterns are unremarkable. White matter changes are stable. No acute cortical ischemia or infarct identified.  No mass effect or midline shift. Vascular: Calcified atherosclerosis in the intracranial carotids. Skull: Normal. Negative for fracture or focal lesion. Sinuses/Orbits: No acute finding. Other: None. IMPRESSION: No acute intracranial abnormalities. Chronic white matter changes. No cause for the patient's symptoms identified. Electronically Signed   By: Gerome Sam III M.D.   On: 06/03/2021 12:31   DG Chest Portable 1 View  Result Date: 06/03/2021 CLINICAL DATA:  Syncope. EXAM: PORTABLE CHEST 1 VIEW COMPARISON:  None. FINDINGS: Heart size is normal. Aortic atherosclerotic calcifications. Lung volumes are low. No signs of pleural effusion or edema. Coarsened interstitial markings are noted. No airspace opacities. Hiatal hernia noted. IMPRESSION: 1. No acute cardiopulmonary abnormalities. 2. Aortic atherosclerotic calcifications. 3. Hiatal hernia. Electronically Signed   By: Signa Kell M.D.   On: 06/03/2021 12:41   ECHOCARDIOGRAM COMPLETE  Result Date: 06/03/2021    ECHOCARDIOGRAM REPORT   Patient Name:   CHRISTINA GINTZ Estelle Date of Exam: 06/03/2021 Medical Rec #:  [id number]     Height:       63.0 in Accession #:    [id number]    Weight:       136.0 lb Date of Birth:  03/29/1926     BSA:          1.641 m Patient Age:    89 years      BP:           160/75 mmHg Patient Gender: F             HR:           56 bpm. [facility] Procedure: 2D Echo, Cardiac Doppler and  Color Doppler                       STAT ECHO Reported to: Dr. Cristal Deer on 06/03/2021 3:58:00 PM. Indications:    R55 Syncope  History:        Patient has no prior history of Echocardiogram examinations.                 Risk Factors:Dyslipidemia and Hypertension.  Sonographer:    Eulah Pont RDCS Referring Phys: [id number] COURAGE EMOKPAE IMPRESSIONS  1. Left ventricular ejection fraction, by estimation, is 60 to 65%. The left ventricle has normal function. The left ventricle has no regional wall motion abnormalities. Left ventricular diastolic parameters are consistent with Grade II diastolic dysfunction (pseudonormalization).  2. Right ventricular systolic function is normal. The right ventricular size is normal. There is normal pulmonary artery systolic pressure.  3. Left atrial size was mildly dilated.  4. The mitral valve is normal in structure. Trivial mitral valve regurgitation. No evidence of mitral stenosis.  5. The aortic valve is tricuspid. There is mild calcification of the aortic valve. Aortic valve regurgitation is trivial. Mild aortic valve sclerosis is present, with no evidence of aortic valve stenosis.  6. The inferior vena cava is normal in size with greater than 50% respiratory variability, suggesting right atrial pressure of 3 mmHg. Comparison(s): No prior Echocardiogram. Conclusion(s)/Recommendation(s): Normal biventricular function without evidence of hemodynamically significant valvular heart disease. FINDINGS  Left Ventricle: Left ventricular ejection fraction, by estimation, is 60 to 65%. The left ventricle has normal function. The left ventricle has no regional wall motion abnormalities. The left ventricular internal cavity size was normal in size. There is  no left ventricular hypertrophy. Left ventricular diastolic parameters are consistent with Grade II diastolic dysfunction (pseudonormalization). Right Ventricle: The right ventricular size is  normal. No increase in right ventricular  wall thickness. Right ventricular systolic function is normal. There is normal pulmonary artery systolic pressure. The tricuspid regurgitant velocity is 2.71 m/s, and  with an assumed right atrial pressure of 3 mmHg, the estimated right ventricular systolic pressure is 32.4 mmHg. Left Atrium: Left atrial size was mildly dilated. Right Atrium: Right atrial size was normal in size. Pericardium: There is no evidence of pericardial effusion. Mitral Valve: The mitral valve is normal in structure. Trivial mitral valve regurgitation. No evidence of mitral valve stenosis. Tricuspid Valve: The tricuspid valve is normal in structure. Tricuspid valve regurgitation is mild . No evidence of tricuspid stenosis. Aortic Valve: The aortic valve is tricuspid. There is mild calcification of the aortic valve. Aortic valve regurgitation is trivial. Aortic regurgitation PHT measures 1027 msec. Mild aortic valve sclerosis is present, with no evidence of aortic valve stenosis. Pulmonic Valve: The pulmonic valve was not well visualized. Pulmonic valve regurgitation is not visualized. No evidence of pulmonic stenosis. Aorta: The aortic root and ascending aorta are structurally normal, with no evidence of dilitation and the aortic arch was not well visualized. Venous: The inferior vena cava is normal in size with greater than 50% respiratory variability, suggesting right atrial pressure of 3 mmHg. IAS/Shunts: The atrial septum is grossly normal.  LEFT VENTRICLE PLAX 2D LVIDd:         4.18 cm  Diastology LVIDs:         2.60 cm  LV e' medial:    3.94 cm/s LV PW:         1.09 cm  LV E/e' medial:  19.0 LV IVS:        0.93 cm  LV e' lateral:   7.18 cm/s LVOT diam:     1.80 cm  LV E/e' lateral: 10.4 LV SV:         60 LV SV Index:   36 LVOT Area:     2.54 cm  RIGHT VENTRICLE RV S prime:     11.90 cm/s TAPSE (M-mode): 1.4 cm LEFT ATRIUM             Index       RIGHT ATRIUM          Index LA diam:        2.90 cm 1.77 cm/m  RA Area:     8.57 cm LA  Vol (A2C):   51.9 ml 31.62 ml/m RA Volume:   13.40 ml 8.16 ml/m LA Vol (A4C):   56.4 ml 34.36 ml/m LA Biplane Vol: 55.7 ml 33.94 ml/m  AORTIC VALVE LVOT Vmax:   105.00 cm/s LVOT Vmean:  66.900 cm/s LVOT VTI:    0.235 m AI PHT:      1027 msec  AORTA Ao Root diam: 2.80 cm Ao Asc diam:  3.00 cm MITRAL VALVE               TRICUSPID VALVE MV Area (PHT): 4.93 cm    TR Peak grad:   29.4 mmHg MV Decel Time: 154 msec    TR Vmax:        271.00 cm/s MV E velocity: 74.70 cm/s MV A velocity: 88.30 cm/s  SHUNTS MV E/A ratio:  0.85        Systemic VTI:  0.24 m                            Systemic Diam: 1.80 cm Jodelle RedBridgette Christopher MD  Electronically signed by Jodelle Red MD Signature Date/Time: 06/03/2021/4:25:06 PM    Final     Procedures Procedures   Medications Ordered in ED Medications  potassium chloride SA (KLOR-CON) CR tablet 40 mEq (40 mEq Oral Given 06/03/21 1457)    ED Course  I have reviewed the triage vital signs and the nursing notes.  Pertinent labs & imaging results that were available during my care of the patient were reviewed by me and considered in my medical decision making (see chart for details).    MDM Rules/Calculators/A&P                         12:57 PM 1:57 PM 87 yo female presenting from [facility] nursing facility for syncope.  Patient is Ax3, no acute distress, afebrile, with stable vitals. Physical exam demonstrates no neurovascular deficits.  Syncope: -CT head and neck demonstrates no acute process. -No seizure like activity -No stroke like symptoms -EKG stable with no STEMI or NSTEMI. No arrhythmias, HR 72 bpm. Stable troponin, bnp, and cxr.  -No hypoglycemia -No sob. No hypoxia. Doubt massive PE.  -No hx of bleeding. No anemia.   Discussed admission with hospitalist and recommended for echo in ED and dispo home as long as normal. Pt signed out to oncoming physician while awaiting echo.         Final Clinical Impression(s) / ED Diagnoses Final  diagnoses:  Syncope, unspecified syncope type    Rx / DC Orders ED Discharge Orders     None        Franne Forts, DO 06/04/21 1257

## 2021-06-03 NOTE — Discharge Instructions (Addendum)
You were seen in the emergency department for evaluation of a syncopal event or fainting spell.  You had lab work done EKG chest x-ray head CT and a cardiac echo that did not show any significant findings.  Please follow-up with your primary care doctor.  Return to the emergency department if any worsening or concerning symptoms

## 2021-06-03 NOTE — ED Notes (Signed)
Pt aggravated and wants to go home. Pt found naked and pulled off all vitals sign attachments. Nurse reapplied and explained delay and requested pt to please not try to get up again.

## 2021-11-03 IMAGING — CT CT HEAD W/O CM
3 series · 15 of 47 positions shown, 18 images · non-contrast
Comparison: February 26, 2021

CLINICAL DATA: Minor head trauma. Patient found sitting in chair
with eyes closed, not responding to stimuli. One episode of nausea
and vomiting.

EXAM:
CT HEAD WITHOUT CONTRAST
TECHNIQUE: Contiguous axial images were obtained from the base of the skull
through the vertex without intravenous contrast.

[Series 2: head w o · axial · 0.43mm/px · z∈[-145,-20]mm · 9 of 30 slices shown, 12 images]
[im 3/30  brain]
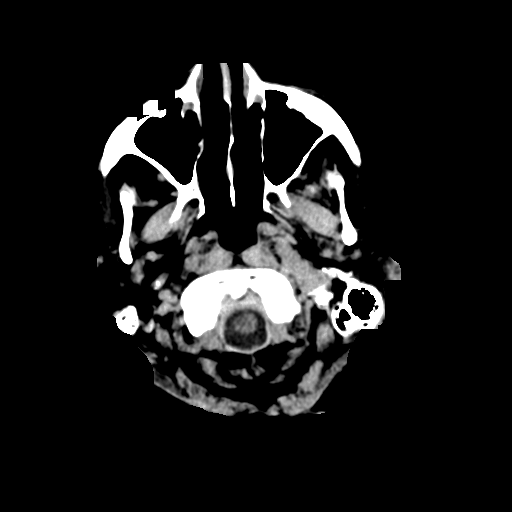
[im 3/30  bone]
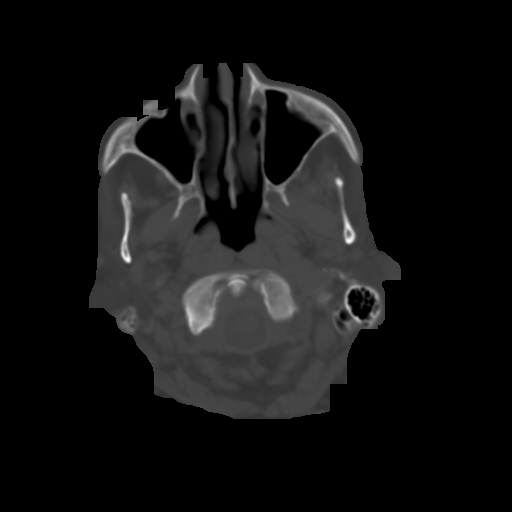
[im 6/30  brain]
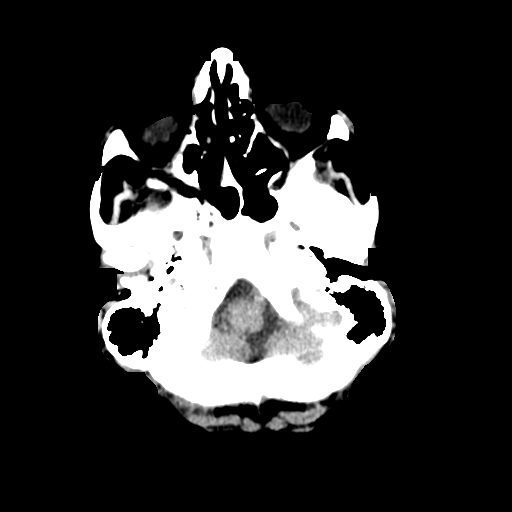
[im 9/30  brain]
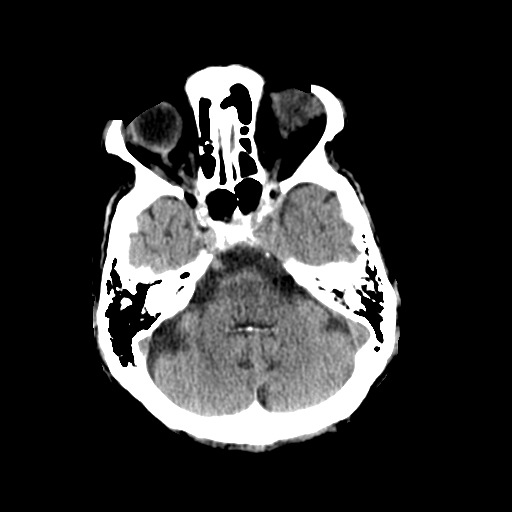
[im 12/30  brain]
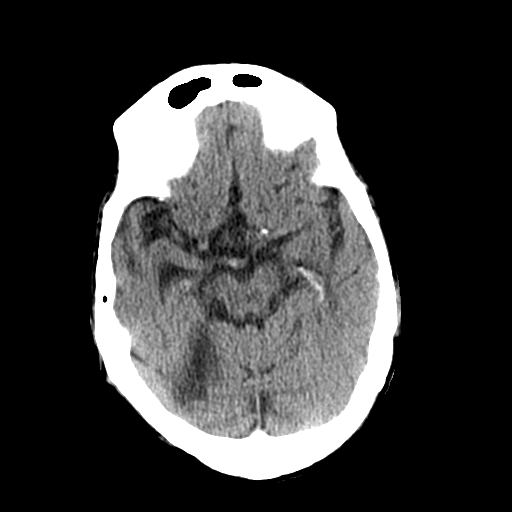
[im 16/30  brain]
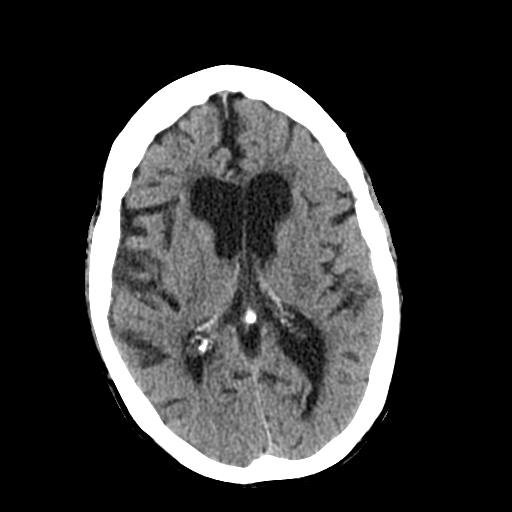
[im 16/30  bone]
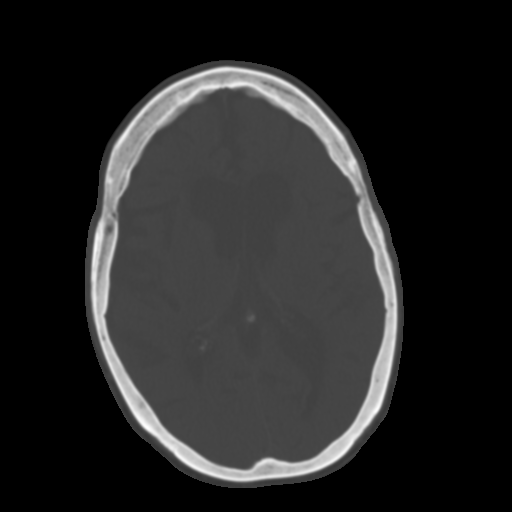
[im 19/30  brain]
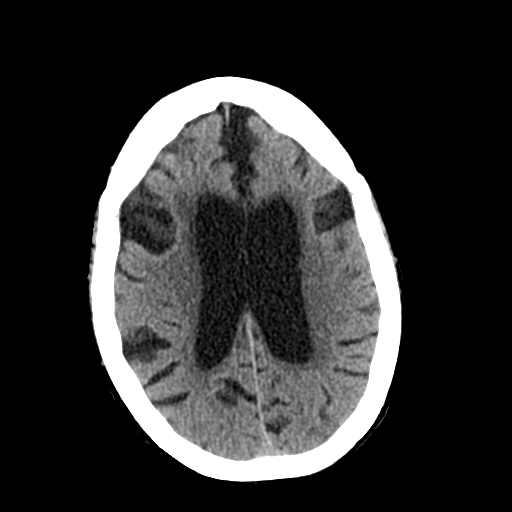
[im 22/30  brain]
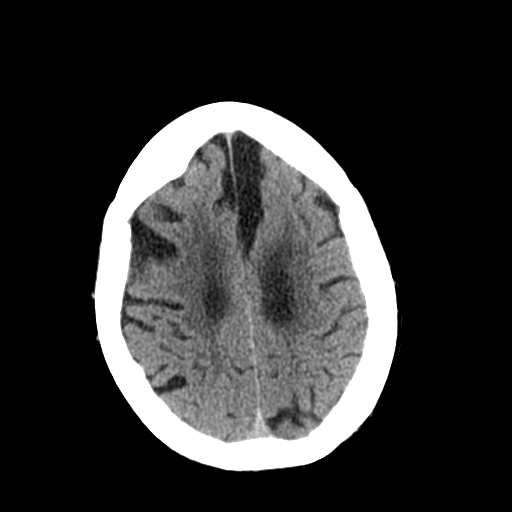
[im 25/30  brain]
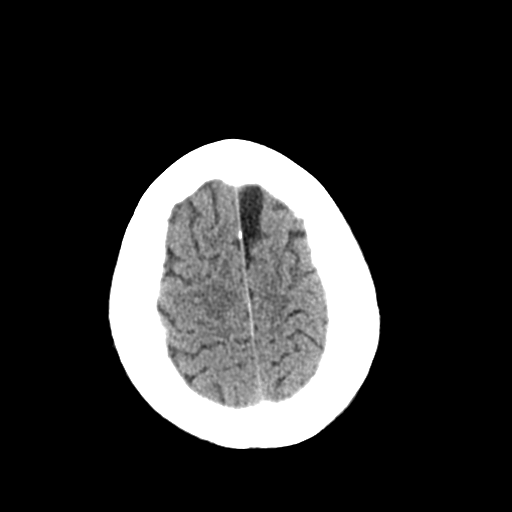
[im 28/30  brain]
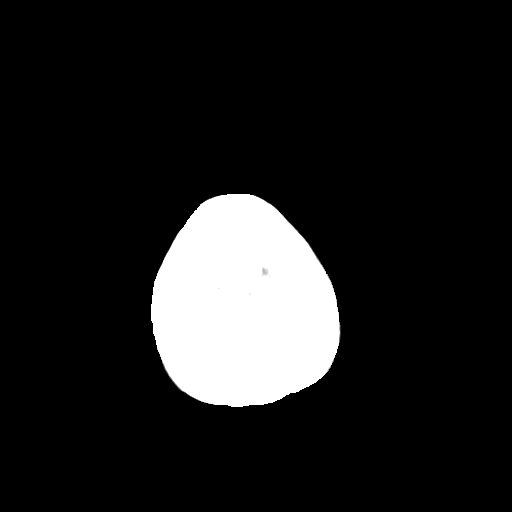
[im 28/30  bone]
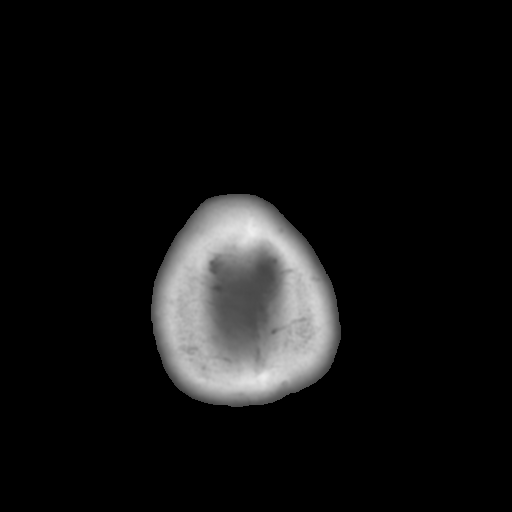

[Series 4: coronal soft · coronal · 0.31mm/px · 3 of 67 slices shown]
[im 23/67  brain]
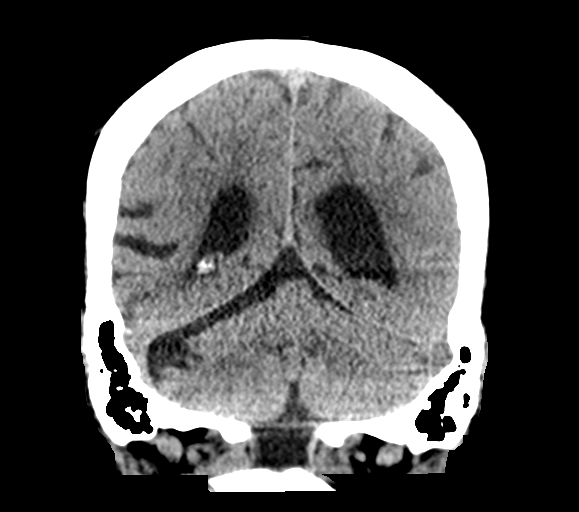
[im 30/67  brain]
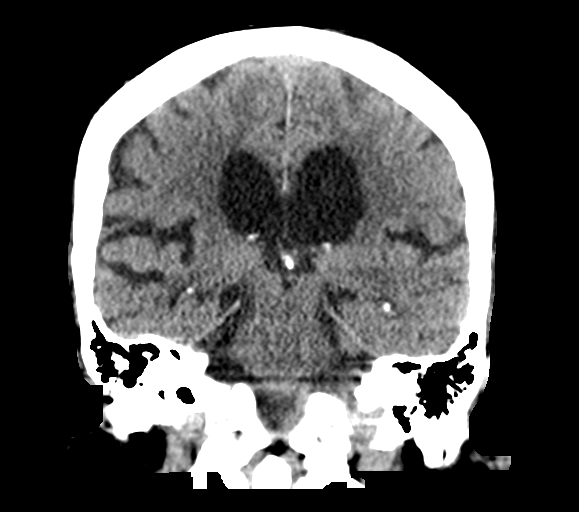
[im 37/67  brain]
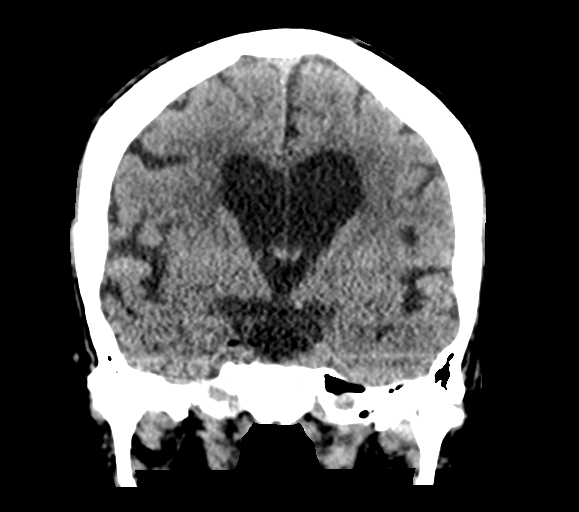

[Series 5: sagittal soft · sagittal · 0.30mm/px · 3 of 61 slices shown]
[im 21/61  brain]
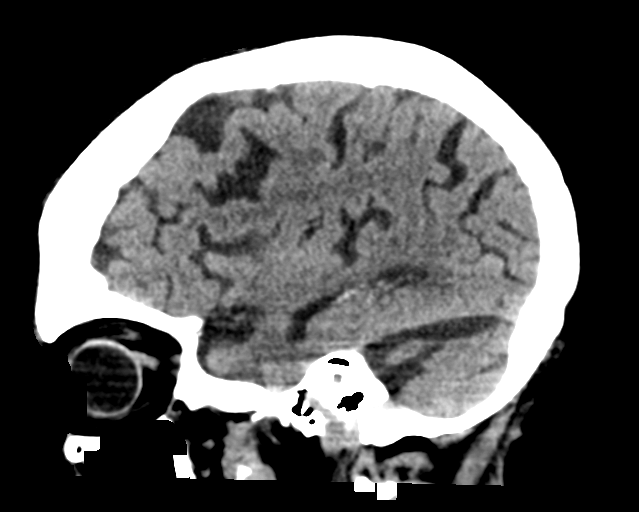
[im 31/61  brain]
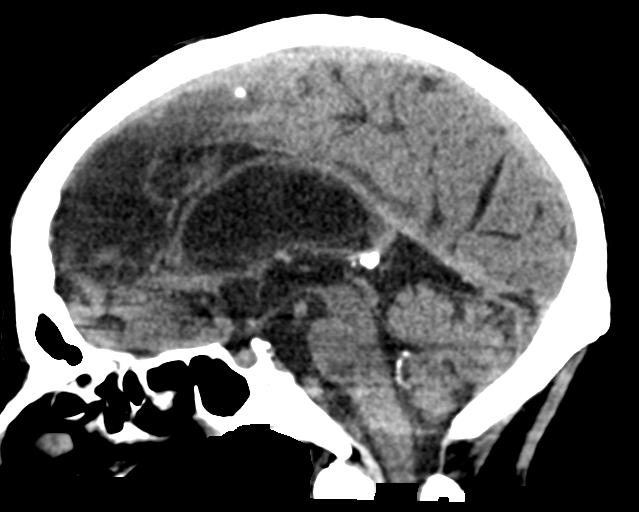
[im 41/61  brain]
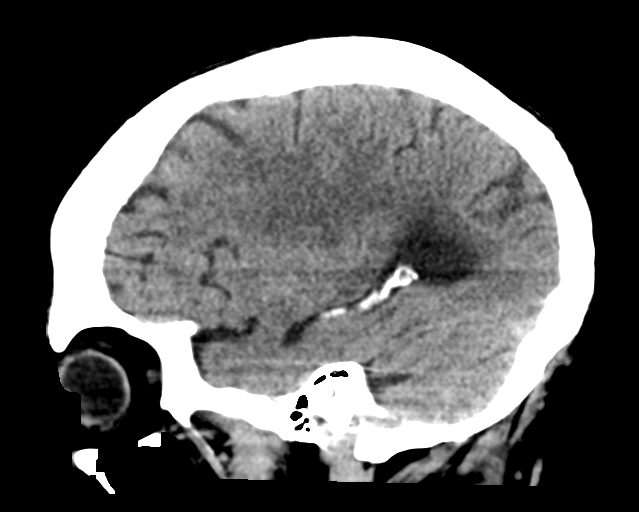

[15 of 47 positions shown; findings below may reference images not displayed]

FINDINGS: Brain: No subdural, epidural, or subarachnoid hemorrhage. Ventricles
and sulci are prominent but stable. Cerebellum, brainstem, and basal
cisterns are unremarkable. White matter changes are stable. No acute
cortical ischemia or infarct identified. No mass effect or midline
shift.

Vascular: Calcified atherosclerosis in the intracranial carotids.

Skull: Normal. Negative for fracture or focal lesion.

Sinuses/Orbits: No acute finding.

Other: None.
IMPRESSION: No acute intracranial abnormalities. Chronic white matter changes.
No cause for the patient's symptoms identified.

## 2022-01-30 DIAGNOSIS — Z6827 Body mass index (BMI) 27.0-27.9, adult: Secondary | ICD-10-CM | POA: Diagnosis not present

## 2022-01-30 DIAGNOSIS — M545 Low back pain, unspecified: Secondary | ICD-10-CM | POA: Diagnosis not present

## 2022-01-30 DIAGNOSIS — H8113 Benign paroxysmal vertigo, bilateral: Secondary | ICD-10-CM | POA: Diagnosis not present

## 2022-01-30 DIAGNOSIS — I10 Essential (primary) hypertension: Secondary | ICD-10-CM | POA: Diagnosis not present

## 2022-01-30 DIAGNOSIS — K219 Gastro-esophageal reflux disease without esophagitis: Secondary | ICD-10-CM | POA: Diagnosis not present

## 2022-05-01 DIAGNOSIS — K219 Gastro-esophageal reflux disease without esophagitis: Secondary | ICD-10-CM | POA: Diagnosis not present

## 2022-05-01 DIAGNOSIS — R531 Weakness: Secondary | ICD-10-CM | POA: Diagnosis not present

## 2022-05-01 DIAGNOSIS — Z0000 Encounter for general adult medical examination without abnormal findings: Secondary | ICD-10-CM | POA: Diagnosis not present

## 2022-05-01 DIAGNOSIS — H8113 Benign paroxysmal vertigo, bilateral: Secondary | ICD-10-CM | POA: Diagnosis not present

## 2022-05-01 DIAGNOSIS — I10 Essential (primary) hypertension: Secondary | ICD-10-CM | POA: Diagnosis not present

## 2022-05-01 DIAGNOSIS — M545 Low back pain, unspecified: Secondary | ICD-10-CM | POA: Diagnosis not present

## 2022-05-01 DIAGNOSIS — Z6827 Body mass index (BMI) 27.0-27.9, adult: Secondary | ICD-10-CM | POA: Diagnosis not present

## 2022-05-06 DIAGNOSIS — Z974 Presence of external hearing-aid: Secondary | ICD-10-CM | POA: Diagnosis not present

## 2022-05-06 DIAGNOSIS — H6123 Impacted cerumen, bilateral: Secondary | ICD-10-CM | POA: Diagnosis not present

## 2022-05-06 DIAGNOSIS — H903 Sensorineural hearing loss, bilateral: Secondary | ICD-10-CM | POA: Diagnosis not present

## 2022-05-28 DIAGNOSIS — R32 Unspecified urinary incontinence: Secondary | ICD-10-CM | POA: Diagnosis not present

## 2022-06-06 DIAGNOSIS — Z0100 Encounter for examination of eyes and vision without abnormal findings: Secondary | ICD-10-CM | POA: Diagnosis not present

## 2022-06-06 DIAGNOSIS — H524 Presbyopia: Secondary | ICD-10-CM | POA: Diagnosis not present

## 2022-06-21 DIAGNOSIS — B349 Viral infection, unspecified: Secondary | ICD-10-CM | POA: Diagnosis not present

## 2022-06-21 DIAGNOSIS — J069 Acute upper respiratory infection, unspecified: Secondary | ICD-10-CM | POA: Diagnosis not present

## 2022-06-21 DIAGNOSIS — J029 Acute pharyngitis, unspecified: Secondary | ICD-10-CM | POA: Diagnosis not present

## 2022-06-21 DIAGNOSIS — J209 Acute bronchitis, unspecified: Secondary | ICD-10-CM | POA: Diagnosis not present

## 2022-07-01 DIAGNOSIS — R32 Unspecified urinary incontinence: Secondary | ICD-10-CM | POA: Diagnosis not present

## 2022-07-31 DIAGNOSIS — R32 Unspecified urinary incontinence: Secondary | ICD-10-CM | POA: Diagnosis not present

## 2022-08-07 DIAGNOSIS — Z0000 Encounter for general adult medical examination without abnormal findings: Secondary | ICD-10-CM | POA: Diagnosis not present

## 2022-08-07 DIAGNOSIS — I10 Essential (primary) hypertension: Secondary | ICD-10-CM | POA: Diagnosis not present

## 2022-08-07 DIAGNOSIS — M545 Low back pain, unspecified: Secondary | ICD-10-CM | POA: Diagnosis not present

## 2022-08-07 DIAGNOSIS — H8113 Benign paroxysmal vertigo, bilateral: Secondary | ICD-10-CM | POA: Diagnosis not present

## 2022-08-07 DIAGNOSIS — Z6826 Body mass index (BMI) 26.0-26.9, adult: Secondary | ICD-10-CM | POA: Diagnosis not present

## 2022-08-07 DIAGNOSIS — K219 Gastro-esophageal reflux disease without esophagitis: Secondary | ICD-10-CM | POA: Diagnosis not present

## 2022-08-27 DIAGNOSIS — R32 Unspecified urinary incontinence: Secondary | ICD-10-CM | POA: Diagnosis not present

## 2022-09-26 DIAGNOSIS — R32 Unspecified urinary incontinence: Secondary | ICD-10-CM | POA: Diagnosis not present

## 2022-10-28 DIAGNOSIS — R32 Unspecified urinary incontinence: Secondary | ICD-10-CM | POA: Diagnosis not present

## 2022-11-04 DIAGNOSIS — H524 Presbyopia: Secondary | ICD-10-CM | POA: Diagnosis not present

## 2022-11-04 DIAGNOSIS — Z0100 Encounter for examination of eyes and vision without abnormal findings: Secondary | ICD-10-CM | POA: Diagnosis not present

## 2022-11-13 DIAGNOSIS — I10 Essential (primary) hypertension: Secondary | ICD-10-CM | POA: Diagnosis not present

## 2022-11-13 DIAGNOSIS — Z6827 Body mass index (BMI) 27.0-27.9, adult: Secondary | ICD-10-CM | POA: Diagnosis not present

## 2022-11-13 DIAGNOSIS — M545 Low back pain, unspecified: Secondary | ICD-10-CM | POA: Diagnosis not present

## 2022-11-13 DIAGNOSIS — M810 Age-related osteoporosis without current pathological fracture: Secondary | ICD-10-CM | POA: Diagnosis not present

## 2022-11-13 DIAGNOSIS — K219 Gastro-esophageal reflux disease without esophagitis: Secondary | ICD-10-CM | POA: Diagnosis not present

## 2022-11-27 DIAGNOSIS — R32 Unspecified urinary incontinence: Secondary | ICD-10-CM | POA: Diagnosis not present

## 2022-12-26 DIAGNOSIS — R32 Unspecified urinary incontinence: Secondary | ICD-10-CM | POA: Diagnosis not present

## 2023-01-29 DIAGNOSIS — R32 Unspecified urinary incontinence: Secondary | ICD-10-CM | POA: Diagnosis not present

## 2023-02-12 DIAGNOSIS — I10 Essential (primary) hypertension: Secondary | ICD-10-CM | POA: Diagnosis not present

## 2023-02-12 DIAGNOSIS — K219 Gastro-esophageal reflux disease without esophagitis: Secondary | ICD-10-CM | POA: Diagnosis not present

## 2023-02-12 DIAGNOSIS — M545 Low back pain, unspecified: Secondary | ICD-10-CM | POA: Diagnosis not present

## 2023-02-12 DIAGNOSIS — M810 Age-related osteoporosis without current pathological fracture: Secondary | ICD-10-CM | POA: Diagnosis not present

## 2023-02-12 DIAGNOSIS — Z6827 Body mass index (BMI) 27.0-27.9, adult: Secondary | ICD-10-CM | POA: Diagnosis not present

## 2023-02-12 DIAGNOSIS — M5459 Other low back pain: Secondary | ICD-10-CM | POA: Diagnosis not present

## 2023-02-12 DIAGNOSIS — Z0000 Encounter for general adult medical examination without abnormal findings: Secondary | ICD-10-CM | POA: Diagnosis not present

## 2023-02-26 DIAGNOSIS — R32 Unspecified urinary incontinence: Secondary | ICD-10-CM | POA: Diagnosis not present

## 2023-03-31 DIAGNOSIS — R32 Unspecified urinary incontinence: Secondary | ICD-10-CM | POA: Diagnosis not present

## 2023-04-29 DIAGNOSIS — R32 Unspecified urinary incontinence: Secondary | ICD-10-CM | POA: Diagnosis not present

## 2023-04-29 DIAGNOSIS — Z0100 Encounter for examination of eyes and vision without abnormal findings: Secondary | ICD-10-CM | POA: Diagnosis not present

## 2023-05-14 DIAGNOSIS — M810 Age-related osteoporosis without current pathological fracture: Secondary | ICD-10-CM | POA: Diagnosis not present

## 2023-05-14 DIAGNOSIS — Z0000 Encounter for general adult medical examination without abnormal findings: Secondary | ICD-10-CM | POA: Diagnosis not present

## 2023-05-14 DIAGNOSIS — Z6828 Body mass index (BMI) 28.0-28.9, adult: Secondary | ICD-10-CM | POA: Diagnosis not present

## 2023-05-14 DIAGNOSIS — I10 Essential (primary) hypertension: Secondary | ICD-10-CM | POA: Diagnosis not present

## 2023-05-14 DIAGNOSIS — K219 Gastro-esophageal reflux disease without esophagitis: Secondary | ICD-10-CM | POA: Diagnosis not present

## 2023-05-14 DIAGNOSIS — M5459 Other low back pain: Secondary | ICD-10-CM | POA: Diagnosis not present

## 2023-05-29 DIAGNOSIS — R32 Unspecified urinary incontinence: Secondary | ICD-10-CM | POA: Diagnosis not present

## 2023-08-13 DIAGNOSIS — K219 Gastro-esophageal reflux disease without esophagitis: Secondary | ICD-10-CM | POA: Diagnosis not present

## 2023-08-13 DIAGNOSIS — I10 Essential (primary) hypertension: Secondary | ICD-10-CM | POA: Diagnosis not present

## 2023-08-13 DIAGNOSIS — M5459 Other low back pain: Secondary | ICD-10-CM | POA: Diagnosis not present

## 2023-08-13 DIAGNOSIS — Z6828 Body mass index (BMI) 28.0-28.9, adult: Secondary | ICD-10-CM | POA: Diagnosis not present

## 2023-08-13 DIAGNOSIS — M810 Age-related osteoporosis without current pathological fracture: Secondary | ICD-10-CM | POA: Diagnosis not present

## 2023-08-13 DIAGNOSIS — Z0000 Encounter for general adult medical examination without abnormal findings: Secondary | ICD-10-CM | POA: Diagnosis not present

## 2023-11-17 ENCOUNTER — Observation Stay (HOSPITAL_COMMUNITY): Payer: Medicare HMO

## 2023-11-17 ENCOUNTER — Observation Stay (HOSPITAL_COMMUNITY)
Admission: EM | Admit: 2023-11-17 | Discharge: 2023-11-18 | Disposition: A | Discharge status: Home Health | Payer: Medicare HMO | Attending: Family Medicine | Admitting: Family Medicine

## 2023-11-17 ENCOUNTER — Emergency Department (HOSPITAL_COMMUNITY): Payer: Medicare HMO

## 2023-11-17 ENCOUNTER — Encounter (HOSPITAL_COMMUNITY): Payer: Self-pay | Admitting: Emergency Medicine

## 2023-11-17 ENCOUNTER — Other Ambulatory Visit: Payer: Self-pay

## 2023-11-17 DIAGNOSIS — I517 Cardiomegaly: Secondary | ICD-10-CM | POA: Diagnosis not present

## 2023-11-17 DIAGNOSIS — Z7901 Long term (current) use of anticoagulants: Secondary | ICD-10-CM | POA: Diagnosis not present

## 2023-11-17 DIAGNOSIS — Z043 Encounter for examination and observation following other accident: Secondary | ICD-10-CM | POA: Diagnosis not present

## 2023-11-17 DIAGNOSIS — I6782 Cerebral ischemia: Secondary | ICD-10-CM | POA: Diagnosis not present

## 2023-11-17 DIAGNOSIS — Z87891 Personal history of nicotine dependence: Secondary | ICD-10-CM | POA: Diagnosis not present

## 2023-11-17 DIAGNOSIS — R1310 Dysphagia, unspecified: Secondary | ICD-10-CM | POA: Diagnosis not present

## 2023-11-17 DIAGNOSIS — I63531 Cerebral infarction due to unspecified occlusion or stenosis of right posterior cerebral artery: Secondary | ICD-10-CM | POA: Diagnosis not present

## 2023-11-17 DIAGNOSIS — Z79899 Other long term (current) drug therapy: Secondary | ICD-10-CM | POA: Diagnosis not present

## 2023-11-17 DIAGNOSIS — R531 Weakness: Secondary | ICD-10-CM | POA: Diagnosis not present

## 2023-11-17 DIAGNOSIS — R55 Syncope and collapse: Secondary | ICD-10-CM | POA: Diagnosis not present

## 2023-11-17 DIAGNOSIS — I6523 Occlusion and stenosis of bilateral carotid arteries: Secondary | ICD-10-CM | POA: Diagnosis not present

## 2023-11-17 DIAGNOSIS — I10 Essential (primary) hypertension: Secondary | ICD-10-CM | POA: Diagnosis not present

## 2023-11-17 DIAGNOSIS — Z515 Encounter for palliative care: Secondary | ICD-10-CM | POA: Insufficient documentation

## 2023-11-17 DIAGNOSIS — Z8673 Personal history of transient ischemic attack (TIA), and cerebral infarction without residual deficits: Secondary | ICD-10-CM | POA: Diagnosis not present

## 2023-11-17 DIAGNOSIS — I639 Cerebral infarction, unspecified: Secondary | ICD-10-CM | POA: Diagnosis not present

## 2023-11-17 DIAGNOSIS — R42 Dizziness and giddiness: Secondary | ICD-10-CM | POA: Diagnosis present

## 2023-11-17 DIAGNOSIS — M160 Bilateral primary osteoarthritis of hip: Secondary | ICD-10-CM | POA: Diagnosis not present

## 2023-11-17 DIAGNOSIS — G319 Degenerative disease of nervous system, unspecified: Secondary | ICD-10-CM | POA: Diagnosis not present

## 2023-11-17 DIAGNOSIS — S0990XA Unspecified injury of head, initial encounter: Secondary | ICD-10-CM | POA: Diagnosis not present

## 2023-11-17 DIAGNOSIS — I700 Atherosclerosis of aorta: Secondary | ICD-10-CM | POA: Diagnosis not present

## 2023-11-17 DIAGNOSIS — K219 Gastro-esophageal reflux disease without esophagitis: Secondary | ICD-10-CM | POA: Diagnosis present

## 2023-11-17 LAB — BASIC METABOLIC PANEL
Anion gap: 12 (ref 5–15)
BUN: 11 mg/dL (ref 8–23)
CO2: 24 mmol/L (ref 22–32)
Calcium: 9.2 mg/dL (ref 8.9–10.3)
Chloride: 104 mmol/L (ref 98–111)
Creatinine, Ser: 0.75 mg/dL (ref 0.44–1.00)
GFR, Estimated: >60 mL/min (ref >60)
Glucose, Bld: 106 mg/dL — ABNORMAL HIGH (ref 70–99)
Potassium: 3.6 mmol/L (ref 3.5–5.1)
Sodium: 140 mmol/L (ref 135–145)

## 2023-11-17 LAB — RESP PANEL BY RT-PCR (RSV, FLU A&B, COVID)  RVPGX2
Influenza A by PCR: NEGATIVE
Influenza B by PCR: NEGATIVE
Resp Syncytial Virus by PCR: NEGATIVE
SARS Coronavirus 2 by RT PCR: NEGATIVE

## 2023-11-17 LAB — CBC WITH DIFFERENTIAL/PLATELET
Abs Immature Granulocytes: 0.02 10*3/uL (ref 0.00–0.07)
Basophils Absolute: 0 10*3/uL (ref 0.0–0.1)
Basophils Relative: 0 %
Eosinophils Absolute: 0.1 10*3/uL (ref 0.0–0.5)
Eosinophils Relative: 1 %
HCT: 44.1 % (ref 36.0–46.0)
Hemoglobin: 13.3 g/dL (ref 12.0–15.0)
Immature Granulocytes: 0 %
Lymphocytes Relative: 38 %
Lymphs Abs: 3.7 10*3/uL (ref 0.7–4.0)
MCH: 24.7 pg — ABNORMAL LOW (ref 26.0–34.0)
MCHC: 30.2 g/dL (ref 30.0–36.0)
MCV: 82 fL (ref 80.0–100.0)
Monocytes Absolute: 0.4 10*3/uL (ref 0.1–1.0)
Monocytes Relative: 4 %
Neutro Abs: 5.5 10*3/uL (ref 1.7–7.7)
Neutrophils Relative %: 57 %
Platelets: 287 10*3/uL (ref 150–400)
RBC: 5.38 MIL/uL — ABNORMAL HIGH (ref 3.87–5.11)
RDW: 15.5 % (ref 11.5–15.5)
WBC: 9.8 10*3/uL (ref 4.0–10.5)
nRBC: 0 % (ref 0.0–0.2)

## 2023-11-17 LAB — LIPID PANEL
Cholesterol: 188 mg/dL (ref 0–200)
HDL: 64 mg/dL (ref >40)
LDL Cholesterol: 108 mg/dL — ABNORMAL HIGH (ref 0–99)
Total CHOL/HDL Ratio: 2.9 {ratio}
Triglycerides: 81 mg/dL (ref <150)
VLDL: 16 mg/dL (ref 0–40)

## 2023-11-17 LAB — HEMOGLOBIN A1C
Hgb A1c MFr Bld: 5.5 % (ref 4.8–5.6)
Mean Plasma Glucose: 111.15 mg/dL

## 2023-11-17 LAB — TSH: TSH: 2.247 u[IU]/mL (ref 0.350–4.500)

## 2023-11-17 LAB — CBG MONITORING, ED: Glucose-Capillary: 116 mg/dL — ABNORMAL HIGH (ref 70–99)

## 2023-11-17 MED ORDER — ONDANSETRON HCL 4 MG/2ML IJ SOLN: 4.0000 mg | Freq: Four times a day (QID) | INTRAMUSCULAR | Status: DC | PRN

## 2023-11-17 MED ORDER — SODIUM CHLORIDE 0.9% FLUSH: 3.0000 mL | INTRAVENOUS | Status: DC | PRN

## 2023-11-17 MED ORDER — DEXTROSE-SODIUM CHLORIDE 5-0.45 % IV SOLN: INTRAVENOUS | Status: DC

## 2023-11-17 MED ORDER — HEPARIN SODIUM (PORCINE) 5000 UNIT/ML IJ SOLN
5000.0000 [IU] | Freq: Three times a day (TID) | INTRAMUSCULAR | Status: DC
  Administered 2023-11-18: 5000 [IU] via SUBCUTANEOUS
  Filled 2023-11-17 (×2): qty 1

## 2023-11-17 MED ORDER — ACETAMINOPHEN 325 MG PO TABS: 650.0000 mg | ORAL_TABLET | Freq: Four times a day (QID) | ORAL | Status: DC | PRN

## 2023-11-17 MED ORDER — PANTOPRAZOLE SODIUM 40 MG PO TBEC: 40.0000 mg | DELAYED_RELEASE_TABLET | Freq: Every day | ORAL | Status: DC

## 2023-11-17 MED ORDER — PANTOPRAZOLE SODIUM 40 MG IV SOLR
40.0000 mg | INTRAVENOUS | Status: DC
  Administered 2023-11-17: 40 mg via INTRAVENOUS
  Filled 2023-11-17: qty 10

## 2023-11-17 MED ORDER — ATORVASTATIN CALCIUM 40 MG PO TABS
40.0000 mg | ORAL_TABLET | Freq: Every day | ORAL | Status: DC
  Filled 2023-11-17: qty 1

## 2023-11-17 MED ORDER — SODIUM CHLORIDE 0.9 % IV SOLN: INTRAVENOUS | Status: DC | PRN

## 2023-11-17 MED ORDER — ASPIRIN 300 MG RE SUPP
300.0000 mg | Freq: Every day | RECTAL | Status: DC
  Filled 2023-11-17: qty 1

## 2023-11-17 MED ORDER — ACETAMINOPHEN 650 MG RE SUPP: 650.0000 mg | Freq: Four times a day (QID) | RECTAL | Status: DC | PRN

## 2023-11-17 MED ORDER — ONDANSETRON HCL 4 MG PO TABS: 4.0000 mg | ORAL_TABLET | Freq: Four times a day (QID) | ORAL | Status: DC | PRN

## 2023-11-17 MED ORDER — SODIUM CHLORIDE 0.9% FLUSH
3.0000 mL | Freq: Two times a day (BID) | INTRAVENOUS | Status: DC
  Administered 2023-11-17: 3 mL via INTRAVENOUS

## 2023-11-17 MED ORDER — BISACODYL 10 MG RE SUPP: 10.0000 mg | Freq: Every day | RECTAL | Status: DC | PRN

## 2023-11-17 MED ORDER — TRAZODONE HCL 50 MG PO TABS: 50.0000 mg | ORAL_TABLET | Freq: Every evening | ORAL | Status: DC | PRN

## 2023-11-17 MED ORDER — POLYETHYLENE GLYCOL 3350 17 G PO PACK: 17.0000 g | PACK | Freq: Every day | ORAL | Status: DC | PRN

## 2023-11-17 NOTE — ED Provider Notes (Signed)
 [place] EMERGENCY DEPARTMENT AT [facility] Provider Note   CSN: [id number] Arrival date & time: 11/17/23  4098     History  Chief Complaint  Patient presents with   Amy Calderon    Amy Calderon is a 89 y.o. female.  HPI    89 year old female comes in with chief complaint of fall. Patient has history of hypertension, dementia.  Patient arrives from [facility] with chief complaint of fall. Pt resides in assisted living facility. She is very independent. They were unaware of fall, but pt told the support staff this morning that she fell and that she was feeling profoundly weak and had some dizziness.  EMS was called thereafter. Last normal last night, as patient complained of feeling unwell immediately after waking up.  Currently patient states that she does not feel dizzy.  She states that earlier today when she got up, she felt dizzy and felt like she was going to faint.  She also felt profoundly weak.  She denies any cough, congestion, body aches.  According to nursing home, they have had a few cases of stomach virus and also flu.  Patient's BP is noted to be elevated.  She is denying any chest pain, headache.  Home Medications Prior to Admission medications   Medication Sig Start Date End Date Taking? Authorizing Provider  acetaminophen  (TYLENOL ) 325 MG tablet Take by mouth. 07/16/23  Yes [provider]  amLODipine  (NORVASC ) 2.5 MG tablet Take 2.5 mg by mouth daily. 11/12/23  Yes [provider]  Calcium  Carb-Cholecalciferol (OYSTER SHELL CALCIUM  W/D) 500-5 MG-MCG TABS Take by mouth.   Yes [provider]  Cholecalciferol 50 MCG (2000 UT) CAPS Take 1 capsule by mouth daily.   Yes [provider]  meclizine  (ANTIVERT ) 25 MG tablet Take 1 tablet (25 mg total) by mouth 3 (three) times daily as needed for dizziness. 02/26/21  Yes Caccavale, Sophia, PA-C  olmesartan (BENICAR) 40 MG tablet Take 40 mg by mouth daily. 11/12/23  Yes  [provider]  omeprazole (PRILOSEC) 20 MG capsule Take 1 capsule by mouth daily. 02/11/21  Yes [provider]      Allergies    Penicillins    Review of Systems   Review of Systems  All other systems reviewed and are negative.   Physical Exam Updated Vital Signs BP (!) 145/90   Pulse (!) 54   Temp 97.8 F (36.6 C) (Oral)   Resp 20   Ht 5\' 3"  (1.6 m)   Wt 62 kg   SpO2 93%   BMI 24.21 kg/m  Physical Exam Vitals and nursing note reviewed.  Constitutional:      Appearance: She is well-developed.  HENT:     Head: Atraumatic.  Eyes:     Comments: No nystagmus  Cardiovascular:     Rate and Rhythm: Normal rate.  Pulmonary:     Effort: Pulmonary effort is normal.  Abdominal:     Tenderness: There is no abdominal tenderness.  Musculoskeletal:     Cervical back: Normal range of motion and neck supple.     Right lower leg: No edema.     Left lower leg: No edema.  Skin:    General: Skin is warm and dry.  Neurological:     Mental Status: She is alert and oriented to person, place, and time.     Motor: Weakness present.     Gait: Gait abnormal.     Comments: Patient has positive pronator  drift over the left upper extremity, and the left lower extremity strength although 4 out of 5 appears slightly weaker compared to the right side  Upon ambulation, patient remains unsteady.     ED Results / Procedures / Treatments   Labs (all labs ordered are listed, but only abnormal results are displayed) Labs Reviewed  BASIC METABOLIC PANEL - Abnormal; Notable for the following components:      Result Value   Glucose, Bld 106 (*)    All other components within normal limits  CBC WITH DIFFERENTIAL/PLATELET - Abnormal; Notable for the following components:   RBC 5.38 (*)    MCH 24.7 (*)    All other components within normal limits  CBG MONITORING, ED - Abnormal; Notable for the following components:   Glucose-Capillary 116 (*)    All other components within  normal limits  RESP PANEL BY RT-PCR (RSV, FLU A&B, COVID)  [id number]    EKG EKG Interpretation Date/Time:  Monday November 17 2023 09:30:00 EST Ventricular Rate:  57 PR Interval:  171 QRS Duration:  91 QT Interval:  435 QTC Calculation: 424 R Axis:   -35  Text Interpretation: Sinus or ectopic atrial rhythm Abnormal R-wave progression, late transition Left ventricular hypertrophy No acute changes No significant change since last tracing Confirmed by Deatra Face [phone]) on 11/17/2023 9:41:36 AM  Radiology MR BRAIN WO CONTRAST Result Date: 11/17/2023 CLINICAL DATA:  Provided history: Neuro deficit, acute, stroke suspected. EXAM: MRI HEAD WITHOUT CONTRAST TECHNIQUE: Multiplanar, multiecho pulse sequences of the brain and surrounding structures were obtained without intravenous contrast. COMPARISON:  Head CT 11/17/2023.  Brain MRI 02/27/2019. FINDINGS: Intermittently motion degraded examination. Most notably, the sagittal T1 sequence is severely motion degraded, the axial T2 sequence is moderate-to-severely motion degraded, the axial T1 sequence is severely motion degraded, the axial T2 FLAIR sequence is severely motion degraded, the axial SWI sequence is severely motion degraded and the coronal T2 sequence is severely motion degraded. Within this limitation, findings are as follows. Brain: Moderate cerebral atrophy. Prominence of the ventricles and sulci appear commensurate. 7 mm acute infarct within the right aspect of the pons (for instance as seen on series 18, image 12) (series 7, image 14). Chronic small vessel ischemic changes within cerebral white matter, incompletely assessed due to the degree of motion degradation. No evidence of an intracranial mass. Severe motion degradation of the axial SWI sequence precludes adequate evaluation for chronic intracranial blood products. No extra-axial fluid collection. No midline shift. Vascular: No definite loss of proximal arterial flow voids. Skull and  upper cervical spine: No focal worrisome marrow lesion. Sinuses/Orbits: No mass or acute finding within the imaged orbits. Prior bilateral ocular lens replacement. Mild mucosal thickening within the right maxillary sinus. IMPRESSION: 1. Significantly motion degraded examination, as described limiting evaluation. 2. 7 mm acute infarct within the right aspect of the pons. 3. Background cerebral atrophy and chronic small vessel ischemic disease. 4. Mild right maxillary sinus mucosal thickening. Electronically Signed   By: Bascom Lily D.O.   On: 11/17/2023 13:32   CT Head Wo Contrast Result Date: 11/17/2023 CLINICAL DATA:  Head trauma, minor (Age >= 65y) EXAM: CT HEAD WITHOUT CONTRAST TECHNIQUE: Contiguous axial images were obtained from the base of the skull through the vertex without intravenous contrast. RADIATION DOSE REDUCTION: This exam was performed according to the departmental dose-optimization program which includes automated exposure control, adjustment of the mA and/or kV according to patient size and/or use of iterative reconstruction technique. COMPARISON:  Head  CT 06/03/2021 FINDINGS: Brain: No hemorrhage. No hydrocephalus. No extra-axial fluid collection. No mass effect. No mass lesion. No CT evidence of an acute cortical infarct. There is a background of mild chronic microvascular ischemic change. There is apparent mild asymmetric volume loss in the right cerebral hemisphere compared to the left. Vascular: No hyperdense vessel or unexpected calcification. Skull: Normal. Negative for fracture or focal lesion. Sinuses/Orbits: No middle ear or mastoid effusion. Paranasal sinuses are clear. Bilateral lens replacement. Orbits are otherwise unremarkable. Other: None. IMPRESSION: No CT evidence of intracranial injury. Electronically Signed   By: Clora Dane M.D.   On: 11/17/2023 10:31    Procedures .Critical Care  Performed by: Deatra Face, MD Authorized by: Deatra Face, MD   Critical  care provider statement:    Critical care time (minutes):  42   Critical care was time spent personally by me on the following activities:  Development of treatment plan with patient or surrogate, discussions with consultants, evaluation of patient's response to treatment, examination of patient, ordering and review of laboratory studies, ordering and review of radiographic studies, ordering and performing treatments and interventions, pulse oximetry, re-evaluation of patient's condition and review of old charts     Medications Ordered in ED Medications - No data to display  ED Course/ Medical Decision Making/ A&P                                 Medical Decision Making Amount and/or Complexity of Data Reviewed Labs: ordered. Radiology: ordered.  Risk Decision regarding hospitalization.   This patient presents to the ED with chief complaint(s) of weakness, fall with pertinent past medical history of hypertension, dementia.The complaint involves an extensive differential diagnosis and also carries with it a high risk of complications and morbidity.    The differential diagnosis includes : Flu, COVID, viral illness, severe electrolyte abnormality, dehydration, traumatic head injury, hip fracture, stroke.  The initial plan is to get basic labs, CT scan of the brain.  We will repeat assess her after the flu test returns.   Additional history obtained: Additional history obtained from nursing home/care facility Reviewed patient's medications.  Independent labs interpretation:  The following labs were independently interpreted: Flu test is negative. Basic labs are overall reassuring, no profound anemia.  Independent visualization and interpretation of imaging: - I independently visualized the following imaging with scope of interpretation limited to determining acute life threatening conditions related to emergency care: CT scan of the brain, which revealed no evidence of brain  bleed.  Treatment and Reassessment: Patient reassessed.  Neuroexam concerning for left-sided weakness and continued ataxia.  At this time we will get MRI of the brain along with x-ray of the chest and pelvis.  Patient was able to bear weight which is reassuring.  Consultation: - Consulted or discussed management/test interpretation with external professional: Neurology -spoke with Dr. Doretta Gant.  Discussed patient's presenting symptoms, MRI findings.  She is comfortable with patient being admitted at [facility].  Request that hospitalist team can put in a consult for the neurologist that covers [facility], who might be available until 4 PM.  I also consulted DSS at [facility] and left a message with the social work who took the intake.  They were made aware of the finding and give us  permission to admit.  Final Clinical Impression(s) / ED Diagnoses Final diagnoses:  Acute ischemic stroke (HCC)    Rx / DC Orders  ED Discharge Orders     None         Deatra Face, MD 11/17/23 1406

## 2023-11-17 NOTE — ED Notes (Signed)
 Pt completed ambulation with pulse ox. Pt's sats remained above 95% on RA.

## 2023-11-17 NOTE — ED Triage Notes (Signed)
 Pt arrived by [facility] from [facility]. Pt stated to staff she got up to go to the bathroom and fell but was able to get back up. Stated she felt dizzy at the time. Denies pain. Pt has dementia at baseline.

## 2023-11-17 NOTE — Progress Notes (Signed)
 Attempted to call guardian to complete admission profile. Completed by chart review.

## 2023-11-17 NOTE — ED Notes (Signed)
 Legal Guardian was contacted no answer. Message was left.

## 2023-11-17 NOTE — H&P (Signed)
 History and Physical    Patient: Amy Calderon ZOX:[id number] DOB: [birth year] DOA: 11/17/2023 DOS: the patient was seen and examined on 11/17/2023 PCP: Veda Gerald, MD  Patient coming from: [facility]  Chief Complaint:  Chief Complaint  Patient presents with   Fall   HPI: Amy Calderon is a 89 y.o. female with medical history significant for HLD, HTN, dementia, GERD and history of depression with anxiety who presents from [facility] with complaints of dizzy spells and a fall earlier today--- patient with left-sided weakness, slurred speech and swallowing difficulty -Denies Chest pain, palpitations or dyspnea  No Nausea, Vomiting or Diarrhea No fever  Or chills  -No urinary symptoms -Patient noted to have difficulty with swallowing and left-sided weakness -Overall patient is a poor historian due to underlying dementia - In the ED EKG was sinus rhythm no acute findings -Pelvic x-rays without acute findings -Chest x-ray without acute findings -CT head without acute findings MRI brain shows-7 mm acute infarct within the right aspect of the pons,  Background cerebral atrophy and chronic small vessel ischemic disease. -LDL 108, HDL 64 triglycerides 81 -COVID flu and RSV negative -Hemoglobin 13.3 WBC 9.8 platelets 287 -Creatinine 0.75, potassium 3.6 , sodium 140  Review of Systems: As mentioned in the history of present illness. All other systems reviewed and are negative. Past Medical History:  Diagnosis Date   Anxiety    Depression    Hyperlipidemia    Hypertension    Memory loss    History reviewed. No pertinent surgical history. Social History:  reports that she quit smoking about 70 years ago. Her smoking use included cigarettes. She has never used smokeless tobacco. She reports that she does not drink alcohol and does not use drugs.  Allergies  Allergen Reactions   Penicillins Hives and Shortness Of Breath    Family History  Family history unknown: Yes     Prior to Admission medications   Medication Sig Start Date End Date Taking? Authorizing Provider  acetaminophen  (TYLENOL ) 325 MG tablet Take by mouth. 07/16/23  Yes [provider]  amLODipine  (NORVASC ) 2.5 MG tablet Take 2.5 mg by mouth daily. 11/12/23  Yes [provider]  Calcium  Carb-Cholecalciferol (OYSTER SHELL CALCIUM  W/D) 500-5 MG-MCG TABS Take by mouth.   Yes [provider]  Cholecalciferol 50 MCG (2000 UT) CAPS Take 1 capsule by mouth daily.   Yes [provider]  meclizine  (ANTIVERT ) 25 MG tablet Take 1 tablet (25 mg total) by mouth 3 (three) times daily as needed for dizziness. 02/26/21  Yes Caccavale, Sophia, PA-C  olmesartan (BENICAR) 40 MG tablet Take 40 mg by mouth daily. 11/12/23  Yes [provider]  omeprazole (PRILOSEC) 20 MG capsule Take 1 capsule by mouth daily. 02/11/21  Yes [provider]    Physical Exam: Vitals:   11/17/23 1000 11/17/23 1045 11/17/23 1100 11/17/23 1130  BP: (!) 185/113 (!) 176/109 (!) 166/82 (!) 145/90  Pulse: 62 61 (!) 59 (!) 54  Resp: 19 20 (!) 24 20  Temp:      TempSrc:      SpO2: 93% 94% 92% 93%  Weight:      Height:        Physical Exam Gen:- Awake Alert, in no acute distress  HEENT:- [place].AT, No sclera icterus Neck-Supple Neck,No JVD,.  Lungs-  CTAB , fair air movement bilaterally  CV- S1, S2 normal, RRR Abd-  +ve B.Sounds, Abd Soft, No tenderness,    Extremity/Skin:-  No  edema,   good pedal pulses  Psych-affect is appropriate, oriented x3, cognitive and memory deficits consistent with underlying dementia Neuro--patient with left-sided weakness, slurred speech and swallowing difficulty, no tremors  Data Reviewed: In the ED EKG was sinus rhythm no acute findings -Pelvic x-rays without acute findings -Chest x-ray without acute findings -CT head without acute findings MRI brain shows-7 mm acute infarct within the right aspect of the pons,  Background cerebral atrophy and chronic  small vessel ischemic disease. -LDL 108, HDL 64 triglycerides 81 -COVID flu and RSV negative -Hemoglobin 13.3 WBC 9.8 platelets 287 -Creatinine 0.75, potassium 3.6 , sodium 140  Assessment and Plan: 1)Acute CVA -Brain MRI brain shows-7 mm acute infarct within the right aspect of the pons  --place on telemetry monitored unit,  --treat empirically with aspirin  and  atorvastatin  Rx,  -Echocardiogram to rule out intracardiac thrombus  and to evaluate EF is pending,  -carotid artery Dopplers or CTA  or MRA Head to rule out or large Vessel occlusion or hemodynamically significant stenosis  --.Neurology consult pending.  - PT/OT eval pending, speech eval pending, We will allow some permissive hypertension in view of possible acute stroke, avoid precipitous drop in blood pressure. Use Hydralazine 10mg  or Labetalol 10 mg iv every 4 hrs as needed for systolic blood pressure over 210 mmhg , check TSH, A1c  -LDL 108, HDL 64 triglycerides 81 # Maximize blood glucose control with insulin coverage (goal FS 60-180) #Please maintain euthermia. Tylenol  prn for temp >100.4 # Frequent neuro checks # NPO until passes stroke swallow screen  2)HTN--- hold olmesartan and amlodipine  -We will allow some permissive hypertension in view of possible acute stroke, avoid precipitous drop in blood pressure. Use Hydralazine 10mg  or Labetalol 10 mg iv every 4 hrs as needed for systolic blood pressure over 210 mmhg  3)GERD--- continue Protonix    4)Social/Ethics--- patient has a state appointed legal guardian -Remains a full code until CODE STATUS to be further verified -Palliative care consult requested   Advance Care Planning:  Full Code   Consults: Neuro/palliative  Family Communication: State appointed legal guardian  Severity of Illness: The appropriate patient status for this patient is OBSERVATION. Observation status is judged to be reasonable and necessary in order to provide the required intensity of  service to ensure the patient's safety. The patient's presenting symptoms, physical exam findings, and initial radiographic and laboratory data in the context of their medical condition is felt to place them at decreased risk for further clinical deterioration. Furthermore, it is anticipated that the patient will be medically stable for discharge from the hospital within 2 midnights of admission.   Author: Colin Dawley, MD 11/17/2023 4:27 PM  For on call review [email or web address].

## 2023-11-18 ENCOUNTER — Telehealth: Payer: Self-pay | Admitting: *Deleted

## 2023-11-18 ENCOUNTER — Other Ambulatory Visit: Payer: Self-pay | Admitting: *Deleted

## 2023-11-18 ENCOUNTER — Observation Stay (HOSPITAL_BASED_OUTPATIENT_CLINIC_OR_DEPARTMENT_OTHER): Payer: Medicare HMO

## 2023-11-18 ENCOUNTER — Encounter (HOSPITAL_COMMUNITY): Payer: Self-pay | Admitting: Family Medicine

## 2023-11-18 ENCOUNTER — Observation Stay (HOSPITAL_COMMUNITY): Payer: Medicare HMO

## 2023-11-18 ENCOUNTER — Other Ambulatory Visit: Payer: Medicare HMO

## 2023-11-18 DIAGNOSIS — Z7189 Other specified counseling: Secondary | ICD-10-CM | POA: Diagnosis not present

## 2023-11-18 DIAGNOSIS — I63531 Cerebral infarction due to unspecified occlusion or stenosis of right posterior cerebral artery: Secondary | ICD-10-CM | POA: Diagnosis not present

## 2023-11-18 DIAGNOSIS — I639 Cerebral infarction, unspecified: Secondary | ICD-10-CM | POA: Diagnosis not present

## 2023-11-18 DIAGNOSIS — G319 Degenerative disease of nervous system, unspecified: Secondary | ICD-10-CM | POA: Diagnosis not present

## 2023-11-18 DIAGNOSIS — Z515 Encounter for palliative care: Secondary | ICD-10-CM

## 2023-11-18 DIAGNOSIS — I6622 Occlusion and stenosis of left posterior cerebral artery: Secondary | ICD-10-CM | POA: Diagnosis not present

## 2023-11-18 DIAGNOSIS — I6389 Other cerebral infarction: Secondary | ICD-10-CM

## 2023-11-18 LAB — ECHOCARDIOGRAM COMPLETE
AR max vel: 1.56 cm2
AV Area VTI: 1.32 cm2
AV Area mean vel: 1.44 cm2
AV Mean grad: 9 mm[Hg]
AV Peak grad: 18.8 mm[Hg]
Ao pk vel: 2.17 m/s
Area-P 1/2: 2.08 cm2
Calc EF: 62.5 %
Height: 63 in
MV VTI: 1.72 cm2
S' Lateral: 2.6 cm
Single Plane A2C EF: 67 %
Single Plane A4C EF: 62.1 %
Weight: 2186.96 [oz_av]

## 2023-11-18 MED ORDER — ATORVASTATIN CALCIUM 40 MG PO TABS: 40.0000 mg | ORAL_TABLET | Freq: Every day | ORAL | 1 refills | Status: AC

## 2023-11-18 MED ORDER — STROKE: EARLY STAGES OF RECOVERY BOOK: Freq: Once | Status: AC

## 2023-11-18 MED ORDER — ACETAMINOPHEN 325 MG PO TABS
650.0000 mg | ORAL_TABLET | Freq: Four times a day (QID) | ORAL | Status: AC | PRN
Start: 2023-11-18 — End: ?

## 2023-11-18 MED ORDER — IOHEXOL 350 MG/ML SOLN
50.0000 mL | Freq: Once | INTRAVENOUS | Status: AC | PRN
  Administered 2023-11-18: 50 mL via INTRAVENOUS

## 2023-11-18 MED ORDER — PANTOPRAZOLE SODIUM 40 MG PO TBEC: 40.0000 mg | DELAYED_RELEASE_TABLET | Freq: Every day | ORAL | 1 refills | Status: AC

## 2023-11-18 MED ORDER — ASPIRIN 81 MG PO TBEC: 81.0000 mg | DELAYED_RELEASE_TABLET | Freq: Every day | ORAL | 11 refills | Status: AC

## 2023-11-18 MED ORDER — CLOPIDOGREL BISULFATE 75 MG PO TABS: 75.0000 mg | ORAL_TABLET | Freq: Every day | ORAL | 0 refills | Status: DC

## 2023-11-18 MED ORDER — ASPIRIN 81 MG PO TBEC
81.0000 mg | DELAYED_RELEASE_TABLET | Freq: Once | ORAL | Status: AC
  Administered 2023-11-18: 81 mg via ORAL
  Filled 2023-11-18: qty 1

## 2023-11-18 NOTE — Discharge Summary (Signed)
 Amy Calderon, is a 89 y.o. female  DOB 02/02/1926  MRN [id number].  Admission date:  11/17/2023  Admitting Physician  Shon Hale, MD  Discharge Date:  11/18/2023   Primary MD  [name], MD  Recommendations for primary care physician for things to follow:   1)Avoid ibuprofen/Advil/Aleve/Motrin/Goody Powders/Naproxen/BC powders/Meloxicam/Diclofenac/Indomethacin and other Nonsteroidal anti-inflammatory medications as these will make you more likely to bleed and can cause stomach ulcers, can also cause Kidney problems.  2)Dysphagia 2 diet (soft-moist food) with thin liquids--- speech therapist reevaluation in 2 to 3 weeks advised  3)Please take Aspirin 81 mg daily along with Plavix 75 mg daily for 21 days then after that STOP the Plavix  and continue ONLY Aspirin 81 mg daily indefinitely--  Admission Diagnosis  Acute ischemic stroke (HCC) [I63.9] Acute stroke due to ischemia [facility]) [I63.9]   Discharge Diagnosis  Acute ischemic stroke (HCC) [I63.9] Acute stroke due to ischemia [facility]) [I63.9]    Principal Problem:   Acute stroke due to ischemia [facility]) Active Problems:   HTN (hypertension)   GERD (gastroesophageal reflux disease)      Past Medical History:  Diagnosis Date   Anxiety    Depression    Hyperlipidemia    Hypertension    Memory loss     History reviewed. No pertinent surgical history.    HPI  from the history and physical done on the day of admission:   HPI: Amy Calderon is a 89 y.o. female with medical history significant for HLD, HTN, dementia, GERD and history of depression with anxiety who presents from [facility] [place] [facility] with complaints of dizzy spells and a fall earlier today--- patient with left-sided weakness, slurred speech and swallowing difficulty -Denies Chest pain, palpitations or dyspnea   No Nausea, Vomiting or Diarrhea No fever  Or chills  -No urinary  symptoms -Patient noted to have difficulty with swallowing and left-sided weakness -Overall patient is a poor historian due to underlying dementia - In the ED EKG was sinus rhythm no acute findings -Pelvic x-rays without acute findings -Chest x-ray without acute findings -CT head without acute findings MRI brain shows-7 mm acute infarct within the right aspect of the pons,  Background cerebral atrophy and chronic small vessel ischemic disease. -LDL 108, HDL 64 triglycerides 81 -COVID flu and RSV negative -Hemoglobin 13.3 WBC 9.8 platelets 287 -Creatinine 0.75, potassium 3.6 , sodium 140    Hospital Course:     1)Acute CVA -Brain MRI brain shows-7 mm acute infarct within the right aspect of the pons  --On telemetry monitored unit no significant arrhythmia -Atorvastatin as prescribed -Discharge on Aspirin 81 mg daily along with Plavix 75 mg daily for 21 days then after that STOP the Plavix  and continue ONLY Aspirin 81 mg daily indefinitely--for secondary stroke Prevention (Per The multicenter SAMMPRIS trial) . -Echocardiogram to rule out intracardiac thrombus  and to evaluate EF is requested and pending,  -carotid artery Dopplers without hemodynamically significant stenosis -  CTA  Head to rule out or large Vessel  occlusion or hemodynamically significant stenosis requested and pending --.Neurology consult appreciated - PT and speech eval and recommendations appreciated--home health physical therapy recommended, --Dysphagia 2 diet with thin liquids recommended --LDL 108, HDL 64 triglycerides 81 -A1c is 5.5 -TSH 2.2  --30-day Zio patch/Holter monitor requested   2)HTN--- -We initially allowed permissive hypertension -Okay to restart Olmesartan and Amlodipine - 3)GERD--- continue Protonix    4)Social/Ethics--- patient has a state appointed legal guardian -Remains a full code until CODE STATUS to be further verified -Palliative care consult appreciated  Discharge Condition:  stable  Follow UP   Follow-up Information     Hasanaj, Myra Gianotti, MD. Schedule an appointment as soon as possible for a visit in 1 month(s).   Specialty: Internal Medicine Contact information: [address] [place] [address] [phone]                 Consults obtained - neuro  Diet and Activity recommendation:  As advised  Discharge Instructions    Discharge Instructions     Ambulatory referral to Neurology   Complete by: As directed    An appointment is requested in approximately: 8-12 weeks   Call MD for:  difficulty breathing, headache or visual disturbances   Complete by: As directed    Call MD for:  persistant dizziness or light-headedness   Complete by: As directed    Call MD for:  persistant nausea and vomiting   Complete by: As directed    Call MD for:  temperature >100.4   Complete by: As directed    Diet general   Complete by: As directed    Dysphagia 2 diet (soft-moist food) with thin liquids--- speech therapist reevaluation in 2 to 3 weeks advised   Discharge instructions   Complete by: As directed    1)Avoid ibuprofen/Advil/Aleve/Motrin/Goody Powders/Naproxen/BC powders/Meloxicam/Diclofenac/Indomethacin and other Nonsteroidal anti-inflammatory medications as these will make you more likely to bleed and can cause stomach ulcers, can also cause Kidney problems.  2)Dysphagia 2 diet (soft-moist food) with thin liquids--- speech therapist reevaluation in 2 to 3 weeks advised  3)Please take Aspirin 81 mg daily along with Plavix 75 mg daily for 21 days then after that STOP the Plavix  and continue ONLY Aspirin 81 mg daily indefinitely--   Increase activity slowly   Complete by: As directed         Discharge Medications     Allergies as of 11/18/2023       Reactions   Penicillins Hives, Shortness Of Breath        Medication List     STOP taking these medications    omeprazole 20 MG capsule Commonly known as: PRILOSEC       TAKE  these medications    acetaminophen 325 MG tablet Commonly known as: TYLENOL Take 2 tablets (650 mg total) by mouth every 6 (six) hours as needed for mild pain (pain score 1-3) (or Fever >/= 101). What changed:  how much to take when to take this reasons to take this   amLODipine 2.5 MG tablet Commonly known as: NORVASC Take 2.5 mg by mouth daily.   aspirin EC 81 MG tablet Take 1 tablet (81 mg total) by mouth daily with breakfast.   atorvastatin 40 MG tablet Commonly known as: LIPITOR Take 1 tablet (40 mg total) by mouth daily. Start taking on: November 19, 2023   Cholecalciferol 50 MCG (2000 UT) Caps Take 1 capsule by mouth daily.   clopidogrel 75 MG tablet Commonly  known as: Plavix Take 1 tablet (75 mg total) by mouth daily. take Aspirin 81 mg daily along with Plavix 75 mg daily for 21 days then after that STOP the Plavix  and continue ONLY Aspirin 81 mg daily indefinitely--   meclizine 25 MG tablet Commonly known as: ANTIVERT Take 1 tablet (25 mg total) by mouth 3 (three) times daily as needed for dizziness.   olmesartan 40 MG tablet Commonly known as: BENICAR Take 40 mg by mouth daily.   Oyster Shell Calcium w/D 500-5 MG-MCG Tabs Take by mouth.   pantoprazole 40 MG tablet Commonly known as: Protonix Take 1 tablet (40 mg total) by mouth daily.       Major procedures and Radiology Reports - PLEASE review detailed and final reports for all details, in brief -   ECHOCARDIOGRAM COMPLETE Result Date: 11/18/2023    ECHOCARDIOGRAM REPORT   Patient Name:   Amy Calderon Date of Exam: 11/18/2023 Medical Rec #:  [id number]     Height:       63.0 in Accession #:    [id number]    Weight:       136.7 lb Date of Birth:  04-30-1926     BSA:          1.645 m Patient Age:    89 years      BP:           126/108 mmHg Patient Gender: F             HR:           48 bpm. [facility] Procedure: 2D Echo, Cardiac Doppler and Color Doppler Indications:    Stroke  History:         Patient has prior history of Echocardiogram examinations, most                 recent 06/03/2021.  Sonographer:    Amy Chionchio Referring Phys: [id number] Cerena Baine IMPRESSIONS  1. Left ventricular ejection fraction, by estimation, is 60 to 65%. The left ventricle has normal function. The left ventricle has no regional wall motion abnormalities. There is mild left ventricular hypertrophy. Left ventricular diastolic parameters are consistent with Grade I diastolic dysfunction (impaired relaxation). Elevated left ventricular end-diastolic pressure.  2. Right ventricular systolic function is normal. The right ventricular size is normal. There is moderately elevated pulmonary artery systolic pressure. The estimated right ventricular systolic pressure is 48.4 mmHg.  3. Left atrial size was moderately dilated.  4. The mitral valve is normal in structure. No evidence of mitral valve regurgitation. No evidence of mitral stenosis.  5. The aortic valve was not well visualized. Aortic valve regurgitation is trivial. Mild aortic valve stenosis. Aortic valve area, by VTI measures 1.32 cm. Aortic valve mean gradient measures 9.0 mmHg. Aortic valve Vmax measures 2.17 m/s.  6. The inferior vena cava is dilated in size with >50% respiratory variability, suggesting right atrial pressure of 8 mmHg.  7. Bubble study is inconclusive due to suboptimal images. Comparison(s): Changes from prior study are noted. Mild AS is new. FINDINGS  Left Ventricle: Left ventricular ejection fraction, by estimation, is 60 to 65%. The left ventricle has normal function. The left ventricle has no regional wall motion abnormalities. The left ventricular internal cavity size was normal in size. There is  mild left ventricular hypertrophy. Left ventricular diastolic parameters are consistent with Grade I diastolic dysfunction (impaired relaxation). Elevated left ventricular end-diastolic pressure. Right Ventricle: The right ventricular  size is normal. No  increase in right ventricular wall thickness. Right ventricular systolic function is normal. There is moderately elevated pulmonary artery systolic pressure. The tricuspid regurgitant velocity is 3.18 m/s, and with an assumed right atrial pressure of 8 mmHg, the estimated right ventricular systolic pressure is 48.4 mmHg. Left Atrium: Left atrial size was moderately dilated. Right Atrium: Right atrial size was normal in size. Pericardium: There is no evidence of pericardial effusion. Mitral Valve: The mitral valve is normal in structure. No evidence of mitral valve regurgitation. No evidence of mitral valve stenosis. MV peak gradient, 3.8 mmHg. The mean mitral valve gradient is 1.0 mmHg. Tricuspid Valve: The tricuspid valve is normal in structure. Tricuspid valve regurgitation is mild . No evidence of tricuspid stenosis. Aortic Valve: The aortic valve was not well visualized. Aortic valve regurgitation is trivial. Mild aortic stenosis is present. Aortic valve mean gradient measures 9.0 mmHg. Aortic valve peak gradient measures 18.8 mmHg. Aortic valve area, by VTI measures 1.32 cm. Pulmonic Valve: The pulmonic valve was not well visualized. Pulmonic valve regurgitation is not visualized. No evidence of pulmonic stenosis. Aorta: The aortic root and ascending aorta are structurally normal, with no evidence of dilitation. Venous: The inferior vena cava is dilated in size with greater than 50% respiratory variability, suggesting right atrial pressure of 8 mmHg. IAS/Shunts: The interatrial septum was not well visualized. Agitated saline contrast was given intravenously to evaluate for intracardiac shunting. Bubble study is inconclusive due to suboptimal images.  LEFT VENTRICLE PLAX 2D LVIDd:         3.90 cm     Diastology LVIDs:         2.60 cm     LV e' medial:    3.70 cm/s LV PW:         1.20 cm     LV E/e' medial:  23.1 LV IVS:        1.10 cm     LV e' lateral:   8.05 cm/s LVOT diam:     2.00 cm     LV E/e' lateral:  10.6 LV SV:         63 LV SV Index:   38 LVOT Area:     3.14 cm  LV Volumes (MOD) LV vol d, MOD A2C: 56.6 ml LV vol d, MOD A4C: 51.7 ml LV vol s, MOD A2C: 18.7 ml LV vol s, MOD A4C: 19.6 ml LV SV MOD A2C:     37.9 ml LV SV MOD A4C:     51.7 ml LV SV MOD BP:      34.2 ml RIGHT VENTRICLE             IVC RV Basal diam:  2.70 cm     IVC diam: 2.20 cm RV S prime:     10.40 cm/s TAPSE (M-mode): 2.1 cm LEFT ATRIUM             Index        RIGHT ATRIUM           Index LA Vol (A2C):   62.0 ml 37.69 ml/m  RA Area:     15.40 cm LA Vol (A4C):   67.4 ml 40.98 ml/m  RA Volume:   33.20 ml  20.18 ml/m LA Biplane Vol: 65.4 ml 39.76 ml/m  AORTIC VALVE                     PULMONIC VALVE AV Area (Vmax):    1.56  cm      PV Vmax:       0.87 m/s AV Area (Vmean):   1.44 cm      PV Peak grad:  3.0 mmHg AV Area (VTI):     1.32 cm AV Vmax:           217.00 cm/s AV Vmean:          142.000 cm/s AV VTI:            0.477 m AV Peak Grad:      18.8 mmHg AV Mean Grad:      9.0 mmHg LVOT Vmax:         108.00 cm/s LVOT Vmean:        65.000 cm/s LVOT VTI:          0.200 m LVOT/AV VTI ratio: 0.42  AORTA Ao Root diam: 2.80 cm Ao Asc diam:  3.00 cm MITRAL VALVE               TRICUSPID VALVE MV Area (PHT): 2.08 cm    TR Peak grad:   40.4 mmHg MV Area VTI:   1.72 cm    TR Vmax:        318.00 cm/s MV Peak grad:  3.8 mmHg MV Mean grad:  1.0 mmHg    SHUNTS MV Vmax:       0.98 m/s    Systemic VTI:  0.20 m MV Vmean:      49.5 cm/s   Systemic Diam: 2.00 cm MV Decel Time: 364 msec MV E velocity: 85.30 cm/s MV A velocity: 90.80 cm/s MV E/A ratio:  0.94 Vishnu Priya Mallipeddi Electronically signed by Winfield Rast Mallipeddi Signature Date/Time: 11/18/2023/1:51:04 PM    Final    US Carotid Bilateral Result Date: 11/17/2023 CLINICAL DATA:  Syncope. EXAM: BILATERAL CAROTID DUPLEX ULTRASOUND TECHNIQUE: Wallace Cullens scale imaging, color Doppler and duplex ultrasound were performed of bilateral carotid and vertebral arteries in the neck. COMPARISON:  None Available.  FINDINGS: Criteria: Quantification of carotid stenosis is based on velocity parameters that correlate the residual internal carotid diameter with NASCET-based stenosis levels, using the diameter of the distal internal carotid lumen as the denominator for stenosis measurement. The following velocity measurements were obtained: RIGHT ICA: 63 cm/sec CCA: 189 cm/sec SYSTOLIC ICA/CCA RATIO:  0.4 ECA: 81 cm/sec LEFT ICA: 62 cm/sec CCA: 84 cm/sec SYSTOLIC ICA/CCA RATIO:  0.9 ECA: 67 cm/sec RIGHT CAROTID ARTERY: Mild intimal thickening and plaque. Some calcification towards the bowel. RIGHT VERTEBRAL ARTERY:  Antegrade flow. LEFT CAROTID ARTERY: Mild intimal thickening and plaque. Mild vessel tortuosity of the common carotid. LEFT VERTEBRAL ARTERY:  Antegrade flow IMPRESSION: Scattered mild intimal thickening and plaque. No hemodynamic significant stenosis of either internal carotid artery. Electronically Signed   By: Karen Kays M.D.   On: 11/17/2023 19:21   DG Pelvis 1-2 Views Result Date: 11/17/2023 CLINICAL DATA:  Fall. EXAM: PELVIS - 1-2 VIEW COMPARISON:  None Available. FINDINGS: There is no evidence of pelvic fracture or diastasis. The sacroiliac joints and pubic symphysis are anatomically aligned. Mild degenerative changes of the bilateral hips. IMPRESSION: No acute osseous abnormality identified on AP pelvic radiograph. Electronically Signed   By: Hart Robinsons M.D.   On: 11/17/2023 15:03   DG Chest 2 View Result Date: 11/17/2023 CLINICAL DATA:  Fall. EXAM: CHEST - 2 VIEW COMPARISON:  Chest radiograph dated 06/03/2021. FINDINGS: The heart size is borderline enlarged. Aortic atherosclerosis. No focal consolidation, pleural effusion, or pneumothorax. Diffuse osseous demineralization. No  acute osseous abnormality identified. IMPRESSION: No acute findings in the chest. Electronically Signed   By: Hart Robinsons M.D.   On: 11/17/2023 15:01   MR BRAIN WO CONTRAST Result Date: 11/17/2023 CLINICAL DATA:   Provided history: Neuro deficit, acute, stroke suspected. EXAM: MRI HEAD WITHOUT CONTRAST TECHNIQUE: Multiplanar, multiecho pulse sequences of the brain and surrounding structures were obtained without intravenous contrast. COMPARISON:  Head CT 11/17/2023.  Brain MRI 02/27/2019. FINDINGS: Intermittently motion degraded examination. Most notably, the sagittal T1 sequence is severely motion degraded, the axial T2 sequence is moderate-to-severely motion degraded, the axial T1 sequence is severely motion degraded, the axial T2 FLAIR sequence is severely motion degraded, the axial SWI sequence is severely motion degraded and the coronal T2 sequence is severely motion degraded. Within this limitation, findings are as follows. Brain: Moderate cerebral atrophy. Prominence of the ventricles and sulci appear commensurate. 7 mm acute infarct within the right aspect of the pons (for instance as seen on series 18, image 12) (series 7, image 14). Chronic small vessel ischemic changes within cerebral white matter, incompletely assessed due to the degree of motion degradation. No evidence of an intracranial mass. Severe motion degradation of the axial SWI sequence precludes adequate evaluation for chronic intracranial blood products. No extra-axial fluid collection. No midline shift. Vascular: No definite loss of proximal arterial flow voids. Skull and upper cervical spine: No focal worrisome marrow lesion. Sinuses/Orbits: No mass or acute finding within the imaged orbits. Prior bilateral ocular lens replacement. Mild mucosal thickening within the right maxillary sinus. IMPRESSION: 1. Significantly motion degraded examination, as described limiting evaluation. 2. 7 mm acute infarct within the right aspect of the pons. 3. Background cerebral atrophy and chronic small vessel ischemic disease. 4. Mild right maxillary sinus mucosal thickening. Electronically Signed   By: Jackey Loge D.O.   On: 11/17/2023 13:32   CT Head Wo  Contrast Result Date: 11/17/2023 CLINICAL DATA:  Head trauma, minor (Age >= 65y) EXAM: CT HEAD WITHOUT CONTRAST TECHNIQUE: Contiguous axial images were obtained from the base of the skull through the vertex without intravenous contrast. RADIATION DOSE REDUCTION: This exam was performed according to the departmental dose-optimization program which includes automated exposure control, adjustment of the mA and/or kV according to patient size and/or use of iterative reconstruction technique. COMPARISON:  Head CT 06/03/2021 FINDINGS: Brain: No hemorrhage. No hydrocephalus. No extra-axial fluid collection. No mass effect. No mass lesion. No CT evidence of an acute cortical infarct. There is a background of mild chronic microvascular ischemic change. There is apparent mild asymmetric volume loss in the right cerebral hemisphere compared to the left. Vascular: No hyperdense vessel or unexpected calcification. Skull: Normal. Negative for fracture or focal lesion. Sinuses/Orbits: No middle ear or mastoid effusion. Paranasal sinuses are clear. Bilateral lens replacement. Orbits are otherwise unremarkable. Other: None. IMPRESSION: No CT evidence of intracranial injury. Electronically Signed   By: Lorenza Cambridge M.D.   On: 11/17/2023 10:31   Micro Results   Recent Results (from the past 240 hours)  Resp panel by RT-PCR (RSV, Flu A&B, Covid) Anterior Nasal Swab     Status: None   Collection Time: 11/17/23 10:28 AM   Specimen: Anterior Nasal Swab  Result Value Ref Range Status   SARS Coronavirus 2 by RT PCR NEGATIVE NEGATIVE Final    Comment: (NOTE) SARS-CoV-2 target nucleic acids are NOT DETECTED.  The SARS-CoV-2 RNA is generally detectable in upper respiratory specimens during the acute phase of infection. The lowest concentration of SARS-CoV-2 viral  copies this assay can detect is 138 copies/mL. A negative result does not preclude SARS-Cov-2 infection and should not be used as the sole basis for treatment  or other patient management decisions. A negative result may occur with  improper specimen collection/handling, submission of specimen other than nasopharyngeal swab, presence of viral mutation(s) within the areas targeted by this assay, and inadequate number of viral copies(<138 copies/mL). A negative result must be combined with clinical observations, patient history, and epidemiological information. The expected result is Negative.  Fact Sheet for Patients:  BloggerCourse.com  Fact Sheet for Healthcare Providers:  SeriousBroker.it  This test is no t yet approved or cleared by the [place] FDA and  has been authorized for detection and/or diagnosis of SARS-CoV-2 by FDA under an Emergency Use Authorization (EUA). This EUA will remain  in effect (meaning this test can be used) for the duration of the COVID-19 declaration under Section 564(b)(1) of the Act, 21 U.S.C.section 360bbb-3(b)(1), unless the authorization is terminated  or revoked sooner.       Influenza A by PCR NEGATIVE NEGATIVE Final   Influenza B by PCR NEGATIVE NEGATIVE Final    Comment: (NOTE) The Xpert Xpress SARS-CoV-2/FLU/RSV plus assay is intended as an aid in the diagnosis of influenza from Nasopharyngeal swab specimens and should not be used as a sole basis for treatment. Nasal washings and aspirates are unacceptable for Xpert Xpress SARS-CoV-2/FLU/RSV testing.  Fact Sheet for Patients: BloggerCourse.com  Fact Sheet for Healthcare Providers: SeriousBroker.it  This test is not yet approved or cleared by the [place] FDA and has been authorized for detection and/or diagnosis of SARS-CoV-2 by FDA under an Emergency Use Authorization (EUA). This EUA will remain in effect (meaning this test can be used) for the duration of the COVID-19 declaration under Section 564(b)(1) of the Act, 21 U.S.C. section  360bbb-3(b)(1), unless the authorization is terminated or revoked.     Resp Syncytial Virus by PCR NEGATIVE NEGATIVE Final    Comment: (NOTE) Fact Sheet for Patients: BloggerCourse.com  Fact Sheet for Healthcare Providers: SeriousBroker.it  This test is not yet approved or cleared by the [place] FDA and has been authorized for detection and/or diagnosis of SARS-CoV-2 by FDA under an Emergency Use Authorization (EUA). This EUA will remain in effect (meaning this test can be used) for the duration of the COVID-19 declaration under Section 564(b)(1) of the Act, 21 U.S.C. section 360bbb-3(b)(1), unless the authorization is terminated or revoked.  Performed at [facility], [address] [place], [place] [address]    Today   Subjective    Amy Calderon today has no new complaints -Patient is following concerns improving -Left-sided weakness improving   Patient has been seen and examined prior to discharge   Objective   Blood pressure (!) 151/78, pulse (!) 54, temperature 98.3 F (36.8 C), resp. rate 18, height 5\' 3"  (1.6 m), weight 62 kg, SpO2 96%.   Intake/Output Summary (Last 24 hours) at 11/18/2023 1358 Last data filed at 11/18/2023 1300 Gross per 24 hour  Intake 300 ml  Output --  Net 300 ml    Exam Gen:- Awake Alert, in no acute distress  HEENT:- [place].AT, No sclera icterus, mild facial asymmetry improved Neck-Supple Neck,No JVD,.  Lungs-  CTAB , fair air movement bilaterally  CV- S1, S2 normal, RRR Abd-  +ve B.Sounds, Abd Soft, No tenderness,    Extremity/Skin:- No  edema,   good pedal pulses  Psych-affect is appropriate, oriented x2, cognitive and memory deficits  consistent with underlying dementia Neuro--improved left-sided weakness, improved speech and improved swallowing , no tremors   Data Review   CBC w Diff:  Lab Results  Component Value Date   WBC 9.8 11/17/2023   HGB 13.3 11/17/2023   HCT 44.1  11/17/2023   PLT 287 11/17/2023   LYMPHOPCT 38 11/17/2023   MONOPCT 4 11/17/2023   EOSPCT 1 11/17/2023   BASOPCT 0 11/17/2023   CMP:  Lab Results  Component Value Date   NA 140 11/17/2023   K 3.6 11/17/2023   CL 104 11/17/2023   CO2 24 11/17/2023   BUN 11 11/17/2023   CREATININE 0.75 11/17/2023   PROT 7.6 02/26/2021   ALBUMIN 3.7 02/26/2021   BILITOT 1.0 02/26/2021   ALKPHOS 68 02/26/2021   AST 22 02/26/2021   ALT 14 02/26/2021   Total Discharge time is about 33 minutes  Shon Hale M.D on 11/18/2023 at 1:58 PM  Go to [email or web address] -  for contact info  [facility] - Office  [phone]

## 2023-11-18 NOTE — TOC Transition Note (Signed)
Transition of Care [facility]) - Discharge Note   Patient Details  Name: Amy Calderon MRN: [id number] Date of Birth: 06/28/1926  Transition of Care [facility]) CM/SW Contact:  Villa Herb, LCSWA Phone Number: 11/18/2023, 3:16 PM   Clinical Narrative:    CSW notes per chart review that pt arrived from [facility]. CSW spoke with pts legal guardian who confirms pt can return once medically stable. CSW spoke to [name] with [facility] who states pt ca return today. D/C summary and Fl2 faxed to facility. [name] states she will arrange [facility] PT/OT for pt. Facility staff will pick pt up, pts RN updated. CSW updated pts LG that D/C back to ALF would be today. TOC signing off.   Final next level of care: Assisted Living Barriers to Discharge: Barriers Resolved   Patient Goals and CMS Choice Patient states their goals for this hospitalization and ongoing recovery are:: return to ALF CMS Medicare.gov Compare Post Acute Care list provided to:: Patient Choice offered to / list presented to : Patient      Discharge Placement                       Discharge Plan and Services Additional resources added to the After Visit Summary for                                       Social Drivers of Health (SDOH) Interventions SDOH Screenings   Food Insecurity: No Food Insecurity (11/17/2023)  Housing: Low Risk  (11/17/2023)  Transportation Needs: No Transportation Needs (11/17/2023)  Utilities: Not At Risk (11/17/2023)  Tobacco Use: Medium Risk (11/18/2023)     Readmission Risk Interventions     No data to display

## 2023-11-18 NOTE — Telephone Encounter (Signed)
Request from Dr. Mariea Clonts for pt to have 30 day Zio patch for stroke. Orders placed for two 14 day monitors.

## 2023-11-18 NOTE — NC FL2 (Signed)
[place] MEDICAID FL2 LEVEL OF CARE FORM     IDENTIFICATION  Patient Name: Amy Calderon Birthdate: 1926/04/14 Sex: female Admission Date (Current Location): 11/17/2023  [facility] and [place] Number:  [employer] and Address:  [facility],  [address] [place] [address]      Provider Number: [id number]  Attending Physician Name and Address:  Shon Hale, MD  Relative Name and Phone Number:  [name] (Legal Guardian)- [phone]    Current Level of Care: Hospital Recommended Level of Care: Assisted Living Facility Prior Approval Number:    Date Approved/Denied:   PASRR Number:    Discharge Plan: Other (Comment) (ALF)    Current Diagnoses: Patient Active Problem List   Diagnosis Date Noted   Acute stroke due to ischemia (HCC) 11/17/2023   HTN (hypertension) 11/17/2023   GERD (gastroesophageal reflux disease) 11/17/2023    Orientation RESPIRATION BLADDER Height & Weight     Self, Place  Normal Incontinent Weight: 136 lb 11 oz (62 kg) Height:  5\' 3"  (160 cm)  BEHAVIORAL SYMPTOMS/MOOD NEUROLOGICAL BOWEL NUTRITION STATUS      Continent Diet (Thin; Dysphagia 2 (chopped))  AMBULATORY STATUS COMMUNICATION OF NEEDS Skin   Independent Verbally Normal                       Personal Care Assistance Level of Assistance  Bathing, Feeding, Dressing Bathing Assistance: Limited assistance Feeding assistance: Independent Dressing Assistance: Limited assistance     Functional Limitations Info  Sight, Hearing, Speech Sight Info: Adequate Hearing Info: Impaired Speech Info: Adequate    SPECIAL CARE FACTORS FREQUENCY  PT (By licensed PT), OT (By licensed OT)     PT Frequency: 3 times weekly OT Frequency: 3 times weekly            Contractures Contractures Info: Not present    Additional Factors Info  Code Status, Allergies Code Status Info: FULL Allergies Info: Penicillins           Current Medications  (11/18/2023):  This is the current hospital active medication list Current Facility-Administered Medications  Medication Dose Route Frequency Provider Last Rate Last Admin   0.9 %  sodium chloride infusion   Intravenous PRN Mariea Clonts, Courage, MD       acetaminophen (TYLENOL) tablet 650 mg  650 mg Oral Q6H PRN Emokpae, Courage, MD       Or   acetaminophen (TYLENOL) suppository 650 mg  650 mg Rectal Q6H PRN Emokpae, Courage, MD       aspirin EC tablet 81 mg  81 mg Oral Once Emokpae, Courage, MD       atorvastatin (LIPITOR) tablet 40 mg  40 mg Oral Daily Emokpae, Courage, MD       bisacodyl (DULCOLAX) suppository 10 mg  10 mg Rectal Daily PRN Emokpae, Courage, MD       dextrose 5 % and 0.45 % NaCl infusion   Intravenous Continuous Emokpae, Courage, MD 50 mL/hr at 11/17/23 2017 New Bag at 11/17/23 2017   heparin injection 5,000 Units  5,000 Units Subcutaneous Q8H Emokpae, Courage, MD   5,000 Units at 11/18/23 1323   ondansetron (ZOFRAN) tablet 4 mg  4 mg Oral Q6H PRN Emokpae, Courage, MD       Or   ondansetron (ZOFRAN) injection 4 mg  4 mg Intravenous Q6H PRN Emokpae, Courage, MD       pantoprazole (PROTONIX) injection 40 mg  40 mg Intravenous Q24H Shon Hale, MD  40 mg at 11/17/23 2158   polyethylene glycol (MIRALAX / GLYCOLAX) packet 17 g  17 g Oral Daily PRN Emokpae, Courage, MD       sodium chloride flush (NS) 0.9 % injection 3 mL  3 mL Intravenous Q12H Emokpae, Courage, MD   3 mL at 11/17/23 2200   sodium chloride flush (NS) 0.9 % injection 3 mL  3 mL Intravenous Q12H Emokpae, Courage, MD   3 mL at 11/17/23 2200   sodium chloride flush (NS) 0.9 % injection 3 mL  3 mL Intravenous PRN Emokpae, Courage, MD       traZODone (DESYREL) tablet 50 mg  50 mg Oral QHS PRN Shon Hale, MD         Discharge Medications: Allergies as of 11/18/2023       Reactions   Penicillins Hives, Shortness Of Breath        Medication List     STOP taking these medications    omeprazole 20 MG  capsule Commonly known as: PRILOSEC       TAKE these medications    acetaminophen 325 MG tablet Commonly known as: TYLENOL Take 2 tablets (650 mg total) by mouth every 6 (six) hours as needed for mild pain (pain score 1-3) (or Fever >/= 101). What changed:  how much to take when to take this reasons to take this   amLODipine 2.5 MG tablet Commonly known as: NORVASC Take 2.5 mg by mouth daily.   aspirin EC 81 MG tablet Take 1 tablet (81 mg total) by mouth daily with breakfast.   atorvastatin 40 MG tablet Commonly known as: LIPITOR Take 1 tablet (40 mg total) by mouth daily. Start taking on: November 19, 2023   Cholecalciferol 50 MCG (2000 UT) Caps Take 1 capsule by mouth daily.   clopidogrel 75 MG tablet Commonly known as: Plavix Take 1 tablet (75 mg total) by mouth daily. take Aspirin 81 mg daily along with Plavix 75 mg daily for 21 days then after that STOP the Plavix  and continue ONLY Aspirin 81 mg daily indefinitely--   meclizine 25 MG tablet Commonly known as: ANTIVERT Take 1 tablet (25 mg total) by mouth 3 (three) times daily as needed for dizziness.   olmesartan 40 MG tablet Commonly known as: BENICAR Take 40 mg by mouth daily.   Oyster Shell Calcium w/D 500-5 MG-MCG Tabs Take by mouth.   pantoprazole 40 MG tablet Commonly known as: Protonix Take 1 tablet (40 mg total) by mouth daily.         Relevant Imaging Results:  Relevant Lab Results:   Additional Information SSN: [id number], [place]

## 2023-11-18 NOTE — Consult Note (Signed)
Consultation Note Date: 11/18/2023   Patient Name: Amy Calderon  DOB: 1926-06-06  MRN: [id number]  Age / Sex: 89 y.o., female  PCP: [name], MD Referring Physician: Shon Hale, MD  Reason for Consultation: Establishing goals of care  HPI/Patient Profile: 89 y.o. female  with past medical history of Dementia, HLD, HTN, GERD, hx depression/anxiety, [facility] admitted on 11/17/2023 with acute CVA.   Clinical Assessment and Goals of Care: I have reviewed medical records including EPIC notes, labs and imaging, received report from RN, assessed the patient.  Amy Calderon is resting quietly in bed.  She appears acutely/chronically ill and frail.  She greets me, making and mostly keeping eye contact.  She is alert and oriented to self and situation, but not place.  I do believe that she can make her basic needs known even though she is extremely hard of hearing.  There is no family at bedside at this time.  Call to legal guardian with [facility], [name], at [phone]  to discuss diagnosis prognosis, GOC, EOL wishes, disposition and options. I introduced Palliative Medicine as specialized medical care for people living with serious illness. It focuses on providing relief from the symptoms and stress of a serious illness. The goal is to improve quality of life for both the patient and the family.  We discussed a brief life review of the patient.  Amy Calderon shares that Amy Calderon is a widow with no children.  She has been at [employer] for several (2 to 3) years.  She has been a ward of the state for several more years.  Amy Calderon shares that Amy Calderon mental status is okay, that she was not aware of a diagnosis of dementia although she has seen mental status decline.     We then focused on their current illness.  We talk about Amy Calderon acute issues, her stroke.  We talk about her  current status, ability to participate in PT, swallowing safely.  We talked about the plan to return to [facility] with home health services.  The natural disease trajectory and expectations at EOL were discussed.  Advanced directives, concepts specific to code status, artifical feeding and hydration, and rehospitalization were considered and discussed.  Ms. Luiz Calderon shares that they have procedures related to changing CODE STATUS and she will bring this up with her supervisor and staff.  We talked about the concept of "treat the treatable, but allow a natural passing".  I shared that the medical team recommends DNR.  She states agreement and shares that she will continue these discussions for Amy Calderon.  l  Discussed the importance of continued conversation with family and the medical providers regarding overall plan of care and treatment options, ensuring decisions are within the context of the patient's values and GOCs.  Questions and concerns were addressed. The family was encouraged to call with questions or concerns.  PMT will continue to support holistically.  Conference with attending, bedside nursing staff, neurology,  transition of care team related to patient condition, needs, goals of care, disposition.   HCPOA  LEGAL GUARDIAN - legal guardian with [facility], [name], at [phone]     SUMMARY OF RECOMMENDATIONS   Continue to treat the treatable Return to [facility] where she has been for approximately 2 to 3 years DSS social worker is to have CODE STATUS discussions with team.    Code Status/Advance Care Planning: Full code - DSS social worker is to have CODE STATUS discussions with team.  Symptom Management:  Per hospitalist, no additional needs at this time.  Palliative Prophylaxis:  Frequent Pain Assessment and Oral Care  Additional Recommendations (Limitations, Scope, Preferences): Full Scope Treatment  Psycho-social/Spiritual:  Desire for  further Chaplaincy support:no Additional Recommendations: Caregiving  Support/Resources and Education on Hospice  Prognosis:  < 6 months, or less would not be surprising based on advanced age, current stroke, decreasing functional status.  Discharge Planning: Return to [facility], where she has been for 2 to 3 years, with home health services.        Primary Diagnoses: Present on Admission:  Acute stroke due to ischemia (HCC)  HTN (hypertension)  GERD (gastroesophageal reflux disease)   I have reviewed the medical record, interviewed the patient and family, and examined the patient. The following aspects are pertinent.  Past Medical History:  Diagnosis Date   Anxiety    Depression    Hyperlipidemia    Hypertension    Memory loss    Social History   Socioeconomic History   Marital status: Widowed    Spouse name: Not on file   Number of children: Not on file   Years of education: Not on file   Highest education level: Not on file  Occupational History   Not on file  Tobacco Use   Smoking status: Former    Current packs/day: 0.00    Types: Cigarettes    Quit date: 10/07/1953    Years since quitting: 70.1   Smokeless tobacco: Never  Vaping Use   Vaping status: Never Used  Substance and Sexual Activity   Alcohol use: Never   Drug use: Never   Sexual activity: Not Currently  Other Topics Concern   Not on file  Social History Narrative   Not on file   Social Drivers of Health   Financial Resource Strain: Not on file  Food Insecurity: No Food Insecurity (11/17/2023)   Hunger Vital Sign    Worried About Running Out of Food in the Last Year: Never true    Ran Out of Food in the Last Year: Never true  Transportation Needs: No Transportation Needs (11/17/2023)   PRAPARE - Administrator, Civil Service (Medical): No    Lack of Transportation (Non-Medical): No  Physical Activity: Not on file  Stress: Not on file  Social Connections: Not on file    Family History  Family history unknown: Yes   Scheduled Meds:  aspirin  300 mg Rectal Daily   atorvastatin  40 mg Oral Daily   heparin  5,000 Units Subcutaneous Q8H   pantoprazole (PROTONIX) IV  40 mg Intravenous Q24H   sodium chloride flush  3 mL Intravenous Q12H   sodium chloride flush  3 mL Intravenous Q12H   Continuous Infusions:  sodium chloride     dextrose 5 % and 0.45 % NaCl 50 mL/hr at 11/17/23 2017   PRN Meds:.sodium chloride, acetaminophen **OR** acetaminophen, bisacodyl, ondansetron **OR** ondansetron (ZOFRAN)  IV, polyethylene glycol, sodium chloride flush, traZODone Medications Prior to Admission:  Prior to Admission medications   Medication Sig Start Date End Date Taking? Authorizing Provider  acetaminophen (TYLENOL) 325 MG tablet Take by mouth. 07/16/23  Yes [provider]  amLODipine (NORVASC) 2.5 MG tablet Take 2.5 mg by mouth daily. 11/12/23  Yes [provider]  Calcium Carb-Cholecalciferol (OYSTER SHELL CALCIUM W/D) 500-5 MG-MCG TABS Take by mouth.   Yes [provider]  Cholecalciferol 50 MCG (2000 UT) CAPS Take 1 capsule by mouth daily.   Yes [provider]  meclizine (ANTIVERT) 25 MG tablet Take 1 tablet (25 mg total) by mouth 3 (three) times daily as needed for dizziness. 02/26/21  Yes Caccavale, Sophia, PA-C  olmesartan (BENICAR) 40 MG tablet Take 40 mg by mouth daily. 11/12/23  Yes [provider]  omeprazole (PRILOSEC) 20 MG capsule Take 1 capsule by mouth daily. 02/11/21  Yes [provider]   Allergies  Allergen Reactions   Penicillins Hives and Shortness Of Breath   Review of Systems  Unable to perform ROS: Acuity of condition    Physical Exam Vitals and nursing note reviewed.  Constitutional:      General: She is not in acute distress.    Appearance: She is ill-appearing.  HENT:     Mouth/Throat:     Mouth: Mucous membranes are dry.  Cardiovascular:     Rate and Rhythm: Normal rate.   Pulmonary:     Effort: Pulmonary effort is normal. No respiratory distress.  Skin:    General: Skin is warm and dry.  Neurological:     Mental Status: She is alert.     Comments: Known dementia, do not ask orientation questions  Psychiatric:     Comments: Calm and cooperative, not fearful     Vital Signs: BP (!) 126/108 (BP Location: Right Arm)   Pulse 61   Temp 97.9 F (36.6 C)   Resp 18   Ht 5\' 3"  (1.6 m)   Wt 62 kg   SpO2 94%   BMI 24.21 kg/m  Pain Scale: 0-10   Pain Score: 0-No pain   SpO2: SpO2: 94 % O2 Device:SpO2: 94 % O2 Flow Rate: .   IO: Intake/output summary:  Intake/Output Summary (Last 24 hours) at 11/18/2023 0845 Last data filed at 11/18/2023 0500 Gross per 24 hour  Intake 0 ml  Output --  Net 0 ml    LBM:   Baseline Weight: Weight: 62 kg Most recent weight: Weight: 62 kg     Palliative Assessment/Data:     Time In: 0820      Time Out: 0915 Time Total: 55 minutes  Greater than 50%  of this time was spent counseling and coordinating care related to the above assessment and plan.  Signed by: Katheran Awe, NP   Please contact Palliative Medicine Team phone at [phone] for questions and concerns.  For individual provider: See Loretha Stapler

## 2023-11-18 NOTE — Plan of Care (Signed)
  Problem: Acute Rehab PT Goals(only PT should resolve) Goal: Pt Will Go Supine/Side To Sit Outcome: Progressing Flowsheets (Taken 11/18/2023 1215) Pt will go Supine/Side to Sit:  with modified independence  with supervision Goal: Patient Will Transfer Sit To/From Stand Outcome: Progressing Flowsheets (Taken 11/18/2023 1215) Patient will transfer sit to/from stand:  with supervision  with modified independence Goal: Pt Will Transfer Bed To Chair/Chair To Bed Outcome: Progressing Flowsheets (Taken 11/18/2023 1215) Pt will Transfer Bed to Chair/Chair to Bed:  with supervision  with modified independence Goal: Pt Will Ambulate Outcome: Progressing Flowsheets (Taken 11/18/2023 1215) Pt will Ambulate:  100 feet  with supervision  with contact guard assist  with rolling walker   12:16 PM, 11/18/23 Ocie Bob, MPT Physical Therapist with [facility] [phone] office [phone] mobile phone

## 2023-11-18 NOTE — Discharge Instructions (Signed)
1)Avoid ibuprofen/Advil/Aleve/Motrin/Goody Powders/Naproxen/BC powders/Meloxicam/Diclofenac/Indomethacin and other Nonsteroidal anti-inflammatory medications as these will make you more likely to bleed and can cause stomach ulcers, can also cause Kidney problems.  2)Dysphagia 2 diet (soft-moist food) with thin liquids--- speech therapist reevaluation in 2 to 3 weeks advised  3)Please take Aspirin 81 mg daily along with Plavix 75 mg daily for 21 days then after that STOP the Plavix  and continue ONLY Aspirin 81 mg daily indefinitely--

## 2023-11-18 NOTE — Plan of Care (Signed)
  Problem: Acute Rehab OT Goals (only OT should resolve) Goal: Pt. Will Perform Grooming Flowsheets (Taken 11/18/2023 1001) Pt Will Perform Grooming: with modified independence Goal: Pt. Will Transfer To Toilet Flowsheets (Taken 11/18/2023 1001) Pt Will Transfer to Toilet: with modified independence Goal: Pt. Will Perform Toileting-Clothing Manipulation Flowsheets (Taken 11/18/2023 1001) Pt Will Perform Toileting - Clothing Manipulation and hygiene: with modified independence Goal: Pt/Caregiver Will Perform Home Exercise Program Flowsheets (Taken 11/18/2023 1001) Pt/caregiver will Perform Home Exercise Program:  Increased strength  Both right and left upper extremity  Independently  [place] OT, MOT

## 2023-11-18 NOTE — Plan of Care (Signed)
Problem: Education: Goal: Knowledge of General Education information will improve Description: Including pain rating scale, medication(s)/side effects and non-pharmacologic comfort measures Outcome: Completed/Met   Problem: Health Behavior/Discharge Planning: Goal: Ability to manage health-related needs will improve Outcome: Completed/Met   Problem: Clinical Measurements: Goal: Ability to maintain clinical measurements within normal limits will improve Outcome: Completed/Met Goal: Will remain free from infection Outcome: Completed/Met Goal: Diagnostic test results will improve Outcome: Completed/Met Goal: Respiratory complications will improve Outcome: Completed/Met Goal: Cardiovascular complication will be avoided Outcome: Completed/Met   Problem: Activity: Goal: Risk for activity intolerance will decrease Outcome: Completed/Met   Problem: Nutrition: Goal: Adequate nutrition will be maintained Outcome: Completed/Met   Problem: Coping: Goal: Level of anxiety will decrease Outcome: Completed/Met   Problem: Elimination: Goal: Will not experience complications related to bowel motility Outcome: Completed/Met Goal: Will not experience complications related to urinary retention Outcome: Completed/Met   Problem: Pain Managment: Goal: General experience of comfort will improve and/or be controlled Outcome: Completed/Met   Problem: Safety: Goal: Ability to remain free from injury will improve Outcome: Completed/Met   Problem: Skin Integrity: Goal: Risk for impaired skin integrity will decrease Outcome: Completed/Met   Problem: Education: Goal: Knowledge of disease or condition will improve Outcome: Completed/Met Goal: Knowledge of secondary prevention will improve (MUST DOCUMENT ALL) Outcome: Completed/Met Goal: Knowledge of patient specific risk factors will improve (DELETE if not current risk factor) Outcome: Completed/Met   Problem: Ischemic Stroke/TIA Tissue  Perfusion: Goal: Complications of ischemic stroke/TIA will be minimized Outcome: Completed/Met   Problem: Coping: Goal: Will verbalize positive feelings about self Outcome: Completed/Met Goal: Will identify appropriate support needs Outcome: Completed/Met   Problem: Health Behavior/Discharge Planning: Goal: Ability to manage health-related needs will improve Outcome: Completed/Met Goal: Goals will be collaboratively established with patient/family Outcome: Completed/Met   Problem: Self-Care: Goal: Ability to participate in self-care as condition permits will improve Outcome: Completed/Met Goal: Verbalization of feelings and concerns over difficulty with self-care will improve Outcome: Completed/Met Goal: Ability to communicate needs accurately will improve Outcome: Completed/Met   Problem: Nutrition: Goal: Risk of aspiration will decrease Outcome: Completed/Met Goal: Dietary intake will improve Outcome: Completed/Met

## 2023-11-18 NOTE — Evaluation (Signed)
Clinical/Bedside Swallow Evaluation Patient Details  Name: LARUE DRAWDY MRN: [id number] Date of Birth: 03-21-1926  Today's Date: 11/18/2023 Time: SLP Start Time (ACUTE ONLY): 1142 SLP Stop Time (ACUTE ONLY): 1202 SLP Time Calculation (min) (ACUTE ONLY): 20 min  Past Medical History:  Past Medical History:  Diagnosis Date   Anxiety    Depression    Hyperlipidemia    Hypertension    Memory loss    Past Surgical History: History reviewed. No pertinent surgical history. HPI:  KARIAH LOREDO is a 89 y.o. female with medical history significant for HLD, HTN, dementia, GERD and history of depression with anxiety who presented on 11/17/23 from [facility] with complaints of dizzy spells and a fall.  Patient with left-sided weakness, slurred speech and swallowing difficulty; MRI revealed on 2/10  7 mm acute infarct within the right aspect of the pons.  Pt failed [facility] swallow screen; currently NPO; ST consulted for swallow evaluation.    Assessment / Plan / Recommendation  Clinical Impression  Pt seen for clinical swallow evaluation with mod verbal/tactile cues provided d/t pt being extremely HOH.  Xerostomia noted during OME, but symmetry normal.  Pt exhibited a brisk swallow response and impaired, prolonged mastication observed d/t missing dentition (only has upper dentures).  Pt consumed thin via tsp, cup and straw and puree consistencies without incident and/or s/s of aspiration present throughout session.  Pt informed SLP she "eats soft foods due to not having bottom teeth."  Pt's speech was min dysarthric c/b imprecise articulation and slightly hypernasal vocal quality.  Recommend initiating a Dysphagia 2(minced)/thin liquid diet with A given for preparing food/liquids to consume and FULL precautions for small bites/sips and slow rate during meals.  ST will f/u briefly for diet tolerance in acute setting and potential dysarthria tx needs.   Thank you for this consult. SLP Visit Diagnosis:  Dysphagia, unspecified (R13.10)    Aspiration Risk  Mild aspiration risk    Diet Recommendation   Thin;Dysphagia 2 (chopped)  Medication Administration: Whole meds with liquid (as tolerated or whole with puree)    Other  Recommendations Oral Care Recommendations: Oral care BID    Recommendations for follow up therapy are one component of a multi-disciplinary discharge planning process, led by the attending physician.  Recommendations may be updated based on patient status, additional functional criteria and insurance authorization.  Follow up Recommendations Follow physician's recommendations for discharge plan and follow up therapies      Assistance Recommended at Discharge  TBD  Functional Status Assessment Patient has had a recent decline in their functional status and demonstrates the ability to make significant improvements in function in a reasonable and predictable amount of time.  Frequency and Duration min 1 x/week  1 week       Prognosis Prognosis for improved oropharyngeal function: Good Barriers to Reach Goals: Cognitive deficits      Swallow Study   General Date of Onset: 11/17/23 HPI: REGENA DELUCCHI is a 89 y.o. female with medical history significant for HLD, HTN, dementia, GERD and history of depression with anxiety who presented on 11/17/23 from [facility] with complaints of dizzy spells and a fall.  Patient with left-sided weakness, slurred speech and swallowing difficulty; MRI revealed on 2/10  7 mm acute infarct within the right aspect of the pons.  Pt failed [facility] swallow screen; currently NPO; ST consulted for swallow evaluation. Type of Study: Bedside Swallow Evaluation Previous Swallow Assessment: [facility] swallow screen; DID NOT PASS  d/t stopping drinking for 3 oz water test. Diet Prior to this Study: NPO Temperature Spikes Noted: No Respiratory Status: Room air History of Recent Intubation: No Behavior/Cognition: Alert;Cooperative;Requires cueing;Other  (Comment) (significant hearing loss) Oral Cavity Assessment: Dry Oral Care Completed by SLP: Yes Oral Cavity - Dentition: Dentures, top;Missing dentition Vision: Functional for self-feeding Self-Feeding Abilities: Needs assist Patient Positioning: Upright in bed Baseline Vocal Quality: Low vocal intensity;Other (comment) (hoarse initially, but improved after hydration) Volitional Cough: Strong Volitional Swallow: Able to elicit    Oral/Motor/Sensory Function Overall Oral Motor/Sensory Function: Within functional limits   Ice Chips Ice chips: Within functional limits Presentation: Spoon   Thin Liquid Thin Liquid: Within functional limits Presentation: Cup;Spoon;Straw    Nectar Thick Nectar Thick Liquid: Not tested   Honey Thick Honey Thick Liquid: Not tested   Puree Puree: Within functional limits Presentation: Spoon   Solid     Solid: Not tested (pt refused d/t limited dentition)      Pat Sully Manzi,M.S.,CCC-SLP 11/18/2023,12:20 PM

## 2023-11-18 NOTE — Consult Note (Signed)
I connected with  Amy Calderon on 11/18/23 by a video enabled telemedicine application and verified that I am speaking with the correct person using two identifiers.   I discussed the limitations of evaluation and management by telemedicine. The patient expressed understanding and agreed to proceed.  [facility] Location of physician: [facility]   Neurology Consultation Reason for Consult: stroke Referring Physician: Dr Shon Hale  CC: dizzy, fall  History is obtained from: chart review, patient is unable to provide any history because of her dementia and is significantly hard of hearing  HPI: Amy Calderon is a 89 y.o. female with PMH of HTN, HLD and dementia, resident of [facility] facility who was brought in due to fall while going to the bathroom and unable to get back up.  She also reported to the staff that she been feeling weak and dizzy.   LKN sometime on 11/16/2023 before she went to bed. No tPA as outside window No thrombectomy as outside window Event happened at nursing home mRS 3/4  ROS: All other systems reviewed and negative except as noted in the HPI.   Past Medical History:  Diagnosis Date   Anxiety    Depression    Hyperlipidemia    Hypertension    Memory loss     Family History  Family history unknown: Yes    Social History:  reports that she quit smoking about 70 years ago. Her smoking use included cigarettes. She has never used smokeless tobacco. She reports that she does not drink alcohol and does not use drugs.   Medications Prior to Admission  Medication Sig Dispense Refill Last Dose/Taking   acetaminophen (TYLENOL) 325 MG tablet Take by mouth.   Taking   amLODipine (NORVASC) 2.5 MG tablet Take 2.5 mg by mouth daily.   11/16/2023 Morning   Calcium Carb-Cholecalciferol (OYSTER SHELL CALCIUM W/D) 500-5 MG-MCG TABS Take by mouth.   11/16/2023 Morning   Cholecalciferol 50 MCG (2000 UT) CAPS Take 1 capsule by mouth daily.    11/16/2023 Morning   meclizine (ANTIVERT) 25 MG tablet Take 1 tablet (25 mg total) by mouth 3 (three) times daily as needed for dizziness. 10 tablet 0 Taking As Needed   olmesartan (BENICAR) 40 MG tablet Take 40 mg by mouth daily.   11/16/2023 Morning   omeprazole (PRILOSEC) 20 MG capsule Take 1 capsule by mouth daily.   11/16/2023      Exam: Current vital signs: BP (!) 147/72 (BP Location: Right Arm)   Pulse (!) 51   Temp 98.2 F (36.8 C) (Oral)   Resp 20   Ht 5\' 3"  (1.6 m)   Wt 62 kg   SpO2 94%   BMI 24.21 kg/m  Vital signs in last 24 hours: Temp:  [97.9 F (36.6 C)-98.5 F (36.9 C)] 98.2 F (36.8 C) (02/11 0914) Pulse Rate:  [51-62] 51 (02/11 0914) Resp:  [14-24] 20 (02/11 0914) BP: (109-185)/(59-137) 147/72 (02/11 0914) SpO2:  [92 %-96 %] 94 % (02/11 0914)   Physical Exam  Constitutional: Appears well-developed and well-nourished.  Psych: Affect appropriate to situation Neuro: Awake, alert, oriented to place and person, time: April: Able to name objects and follow commands, mild dysarthria, cranial nerves appear grossly intact (of note visual fields were difficult to evaluate because patient was very hard of hearing and unable to follow complex commands on tele), antigravity strength in all 4 extremities without drift, FTN intact, sensory intact to light touch  NIHSS 1  INPUTS: 1A: Level of consciousness --> 0 = Alert; keenly responsive 1B: Ask month and age --> 0 = Both questions right 1C: 'Blink eyes' & 'squeeze hands' --> 0 = Performs both tasks 2: Horizontal extraocular movements --> 0 = Normal 3: Visual fields --> 0 = No visual loss 4: Facial palsy --> 0 = Normal symmetry 5A: Left arm motor drift --> 0 = No drift for 10 seconds 5B: Right arm motor drift --> 0 = No drift for 10 seconds 6A: Left leg motor drift --> 0 = No drift for 5 seconds 6B: Right leg motor drift --> 0 = No drift for 5 seconds 7: Limb Ataxia --> 0 = No ataxia 8: Sensation --> 0 = Normal; no  sensory loss 9: Language/aphasia --> 0 = Normal; no aphasia 10: Dysarthria --> 1 = Mild-moderate dysarthria: slurring but can be understood 11: Extinction/inattention --> 0 = No abnormality    I have reviewed labs in epic and the results pertinent to this consultation are: CBC:  Recent Labs  Lab 11/17/23 1007  WBC 9.8  NEUTROABS 5.5  HGB 13.3  HCT 44.1  MCV 82.0  PLT 287    Basic Metabolic Panel:  Lab Results  Component Value Date   NA 140 11/17/2023   K 3.6 11/17/2023   CO2 24 11/17/2023   GLUCOSE 106 (H) 11/17/2023   BUN 11 11/17/2023   CREATININE 0.75 11/17/2023   CALCIUM 9.2 11/17/2023   GFRNONAA >60 11/17/2023   Lipid Panel:  Lab Results  Component Value Date   LDLCALC 108 (H) 11/17/2023   HgbA1c:  Lab Results  Component Value Date   HGBA1C 5.5 11/17/2023   Urine Drug Screen: No results found for: "LABOPIA", "COCAINSCRNUR", "LABBENZ", "AMPHETMU", "THCU", "LABBARB"  Alcohol Level No results found for: "ETH"   I have reviewed the images obtained:  CT Head without contrast: No CT evidence of intracranial injury.    MRI Brain wo contrast 11/17/2023: . Significantly motion degraded examination, as described limiting evaluation. 7 mm acute infarct within the right aspect of the pons. Background cerebral atrophy and chronic small vessel ischemic disease.  US carotid BL 11/17/2023: Scattered mild intimal thickening and plaque. No hemodynamic significant stenosis of either internal carotid artery.    ASSESSMENT/PLAN: 89 year old female who presented with dizziness, fall.  MRI brain with right pontine stroke  Acute ischemic stroke -Likely large vessel atherosclerosis versus embolic  Recommendations: -CTA head to evaluate for intracranial stenosis -TTE ordered and pending -If negative, recommend 30-day cardiac monitor to look for paroxysmal A-fib -If there is intracranial stenosis, recommend aspirin 81 mg daily and Plavix 75 mg daily for 3 months followed by  aspirin 81 mg daily -If there is no intracranial stenosis, recommend aspirin 81 mg daily and Plavix 75 mg daily daily for 3 weeks followed by aspirin 81 mg daily -Atorvastatin 40 mg daily for hyperlipidemia -Goal blood pressure: Permissive hypertension for 24 hours followed by gradual normotension -PT/OT/swallow eval -If patient is unable to swallow, will need to talk to guardian about potential risks of aspiration versus NG tube versus feeding tube -Follow-up with neurology in 3 months (order placed) -Discussed plan with Dr. Mariea Clonts via secure chat   Thank you for allowing [place] to participate in the care of this patient. If you have any further questions, please contact  me or neurohospitalist.   Lindie Spruce Epilepsy [facility] neurohospitalist

## 2023-11-18 NOTE — Evaluation (Signed)
Physical Therapy Evaluation Patient Details Name: Amy Calderon MRN: [id number] DOB: 1926-03-22 Today's Date: 11/18/2023  History of Present Illness  Amy Calderon is a 89 y.o. female with medical history significant for HLD, HTN, dementia, GERD and history of depression with anxiety who presents from [facility] with complaints of dizzy spells and a fall earlier today--- patient with left-sided weakness, slurred speech and swallowing difficulty  -Denies Chest pain, palpitations or dyspnea   Clinical Impression  Patient demonstrates labored movement for transferring to chair, safer using RW, able to ambulate in room/hallway without loss of balance and limited mostly due to fatigue. Patient tolerated sitting up in chair after therapy.  Patient will benefit from continued skilled physical therapy in hospital and recommended venue below to increase strength, balance, endurance for safe ADLs and gait.        If plan is discharge home, recommend the following: A little help with walking and/or transfers;A little help with bathing/dressing/bathroom;Help with stairs or ramp for entrance;Assistance with cooking/housework   Can travel by private vehicle        Equipment Recommendations None recommended by PT  Recommendations for Other Services       Functional Status Assessment Patient has had a recent decline in their functional status and demonstrates the ability to make significant improvements in function in a reasonable and predictable amount of time.     Precautions / Restrictions Precautions Precautions: Fall Restrictions Weight Bearing Restrictions Per Provider Order: No      Mobility  Bed Mobility Overal bed mobility: Needs Assistance Bed Mobility: Supine to Sit     Supine to sit: Min assist     General bed mobility comments: increased time, labored movement    Transfers Overall transfer level: Needs assistance   Transfers: Sit to/from Stand, Bed to  chair/wheelchair/BSC Sit to Stand: Contact guard assist Stand pivot transfers: Contact guard assist         General transfer comment: unsteady labored movement having to lean on armrest of chair when not using an AD, safer using RW    Ambulation/Gait Ambulation/Gait assistance: Contact guard assist Gait Distance (Feet): 65 Feet Assistive device: Rolling walker (2 wheels) Gait Pattern/deviations: Decreased step length - right, Decreased step length - left, Decreased stride length, Decreased dorsiflexion - right, Decreased dorsiflexion - left Gait velocity: decreased     General Gait Details: slow slightly labored movement with limited bilateral ankle dorsiflexion, no loss of balance, limited mostly due to fatigue  Stairs            Wheelchair Mobility     Tilt Bed    Modified Rankin (Stroke Patients Only)       Balance Overall balance assessment: Needs assistance Sitting-balance support: Feet supported, No upper extremity supported Sitting balance-Leahy Scale: Fair Sitting balance - Comments: fair to good seated at EOB   Standing balance support: Reliant on assistive device for balance, During functional activity, Bilateral upper extremity supported Standing balance-Leahy Scale: Fair Standing balance comment: fair to good using RW                             Pertinent Vitals/Pain Pain Assessment Pain Assessment: Faces Faces Pain Scale: No hurt    Home Living Family/patient expects to be discharged to:: Assisted living                 Home Equipment: Rolling Walker (2 wheels)      Prior  Function Prior Level of Function : Needs assist       Physical Assist : Mobility (physical);ADLs (physical) Mobility (physical): Bed mobility;Transfers;Gait;Stairs ADLs (physical): Bathing;IADLs Mobility Comments: household ambulation using RW ADLs Comments: Pt reports needing assist for bathing. From ALF so IADL assist likely.     Extremity/Trunk  Assessment   Upper Extremity Assessment Upper Extremity Assessment: Defer to OT evaluation    Lower Extremity Assessment Lower Extremity Assessment: Generalized weakness    Cervical / Trunk Assessment Cervical / Trunk Assessment: Normal  Communication   Communication Communication: Impaired Factors Affecting Communication: Hearing impaired    Cognition Arousal: Alert Behavior During Therapy: WFL for tasks assessed/performed                           PT - Cognition Comments: very HOH Following commands: Intact       Cueing Cueing Techniques: Verbal cues, Gestural cues     General Comments      Exercises     Assessment/Plan    PT Assessment Patient needs continued PT services  PT Problem List Decreased strength;Decreased activity tolerance;Decreased balance;Decreased mobility       PT Treatment Interventions DME instruction;Gait training;Stair training;Functional mobility training;Therapeutic activities;Therapeutic exercise;Balance training;Patient/family education    PT Goals (Current goals can be found in the Care Plan section)  Acute Rehab PT Goals Patient Stated Goal: return home PT Goal Formulation: With patient Time For Goal Achievement: 11/21/23 Potential to Achieve Goals: Good    Frequency Min 3X/week     Co-evaluation PT/OT/SLP Co-Evaluation/Treatment: Yes Reason for Co-Treatment: To address functional/ADL transfers PT goals addressed during session: Mobility/safety with mobility;Balance;Proper use of DME OT goals addressed during session: ADL's and self-care       AM-PAC PT "6 Clicks" Mobility  Outcome Measure Help needed turning from your back to your side while in a flat bed without using bedrails?: A Little Help needed moving from lying on your back to sitting on the side of a flat bed without using bedrails?: A Little Help needed moving to and from a bed to a chair (including a wheelchair)?: A Little Help needed standing up  from a chair using your arms (e.g., wheelchair or bedside chair)?: A Little Help needed to walk in hospital room?: A Little Help needed climbing 3-5 steps with a railing? : A Lot 6 Click Score: 17    End of Session Equipment Utilized During Treatment: Gait belt Activity Tolerance: Patient tolerated treatment well;Patient limited by fatigue Patient left: in chair;with call bell/phone within reach;with chair alarm set Nurse Communication: Mobility status PT Visit Diagnosis: Unsteadiness on feet (R26.81);Other abnormalities of gait and mobility (R26.89);Muscle weakness (generalized) (M62.81)    Time: 1610-9604 PT Time Calculation (min) (ACUTE ONLY): 18 min   Charges:   PT Evaluation $PT Eval Low Complexity: 1 Low PT Treatments $Therapeutic Activity: 8-22 mins PT General Charges $$ ACUTE PT VISIT: 1 Visit         12:15 PM, 11/18/23 Ocie Bob, MPT Physical Therapist with [facility] 336 [phone] office [phone] mobile phone

## 2023-11-18 NOTE — Evaluation (Signed)
Occupational Therapy Evaluation Patient Details Name: Amy Calderon MRN: [id number] DOB: 1925/10/11 Today's Date: 11/18/2023   History of Present Illness   History of Present Illness: Amy Calderon is a 89 y.o. female with medical history significant for HLD, HTN, dementia, GERD and history of depression with anxiety who presents from [facility] with complaints of dizzy spells and a fall earlier today--- patient with left-sided weakness, slurred speech and swallowing difficulty  -Denies Chest pain, palpitations or dyspnea (per MD)     Clinical Impressions Pt agreeable to OT and PT co-evaluation. Pt very hard of hearing at baseline. Pt reported assist for bathing at baseline and seemed to suggest use of RW at baseline. Pt required min A for bed mobility and CGA for ambulation with RW. Set up assist for seated ADL's. B UE generally weak. Pt left in the chair with call bell within reach and chair alarm set. Pt will benefit from continued OT in the hospital and recommended venue below to increase strength, balance, and endurance for safe ADL's.        If plan is discharge home, recommend the following:   A little help with walking and/or transfers;A little help with bathing/dressing/bathroom;Assistance with cooking/housework;Assist for transportation;Help with stairs or ramp for entrance     Functional Status Assessment   Patient has had a recent decline in their functional status and demonstrates the ability to make significant improvements in function in a reasonable and predictable amount of time.     Equipment Recommendations   None recommended by OT              Precautions/Restrictions   Precautions Precautions: Fall Restrictions Weight Bearing Restrictions Per Provider Order: No     Mobility Bed Mobility Overal bed mobility: Needs Assistance Bed Mobility: Supine to Sit     Supine to sit: Min assist     General bed mobility comments: single hand hand  held assist when coming to sit at EOB; mild labored effort    Transfers Overall transfer level: Needs assistance   Transfers: Sit to/from Stand, Bed to chair/wheelchair/BSC Sit to Stand: Contact guard assist Stand pivot transfers: Contact guard assist         General transfer comment: bed to chair without RW; pt leaning on chair with labored effort      Balance Overall balance assessment: Needs assistance Sitting-balance support: No upper extremity supported, Feet supported Sitting balance-Leahy Scale: Fair Sitting balance - Comments: fair to good seated at EOB   Standing balance support: Bilateral upper extremity supported, During functional activity Standing balance-Leahy Scale: Fair Standing balance comment: fair to good using RW                           ADL either performed or assessed with clinical judgement   ADL Overall ADL's : Needs assistance/impaired     Grooming: Set up;Sitting   Upper Body Bathing: Set up;Sitting   Lower Body Bathing: Set up;Sitting/lateral leans;Minimal assistance   Upper Body Dressing : Set up;Sitting   Lower Body Dressing: Set up;Sitting/lateral leans   Toilet Transfer: Contact guard assist;Stand-pivot;Ambulation;Rolling walker (2 wheels) Toilet Transfer Details (indicate cue type and reason): Simulated via EOB to chair without RW and ambulation in the room with RW Toileting- Clothing Manipulation and Hygiene: Set up;Sitting/lateral lean       Functional mobility during ADLs: Contact guard assist;Rolling walker (2 wheels) General ADL Comments: Able to ambulate over 100 feet in  the hall using RW     Vision Baseline Vision/History: 1 Wears glasses Ability to See in Adequate Light: 1 Impaired Patient Visual Report:  (no reports of change) Vision Assessment?: No apparent visual deficits Additional Comments: no obvious deficits during functional tasks.     Perception Perception: Not tested       Praxis Praxis: Not  tested       Pertinent Vitals/Pain Pain Assessment Pain Assessment: Faces Faces Pain Scale: No hurt     Extremity/Trunk Assessment Upper Extremity Assessment Upper Extremity Assessment: Generalized weakness   Lower Extremity Assessment Lower Extremity Assessment: Defer to PT evaluation   Cervical / Trunk Assessment Cervical / Trunk Assessment: Kyphotic   Communication Communication Communication: Impaired Factors Affecting Communication: Hearing impaired   Cognition Arousal: Alert Behavior During Therapy: WFL for tasks assessed/performed Cognition: History of cognitive impairments             OT - Cognition Comments: Very hard of hearing, which complicated communication; able to follow 1 step comamnds well.                 Following commands: Intact       Cueing  General Comments   Cueing Techniques: Verbal cues;Gestural cues                 Home Living Family/patient expects to be discharged to:: Assisted living                             Home Equipment: Rolling Walker (2 wheels)          Prior Functioning/Environment Prior Level of Function : Needs assist       Physical Assist : ADLs (physical)   ADLs (physical): Bathing;IADLs Mobility Comments: Pt seemed to report she uses a RW for ambulation. ADLs Comments: Pt reports needing assist for bathing. From ALF so IADL assist likely.    OT Problem List: Decreased strength;Decreased activity tolerance   OT Treatment/Interventions: Self-care/ADL training;Therapeutic exercise;Neuromuscular education;Therapeutic activities;Patient/family education;Balance training;DME and/or AE instruction      OT Goals(Current goals can be found in the care plan section)   Acute Rehab OT Goals Patient Stated Goal: return home OT Goal Formulation: With patient Time For Goal Achievement: 12/02/23 Potential to Achieve Goals: Good   OT Frequency:  Min 1X/week    Co-evaluation PT/OT/SLP  Co-Evaluation/Treatment: Yes Reason for Co-Treatment: To address functional/ADL transfers   OT goals addressed during session: ADL's and self-care      AM-PAC OT "6 Clicks" Daily Activity     Outcome Measure Help from another person eating meals?: None Help from another person taking care of personal grooming?: A Little Help from another person toileting, which includes using toliet, bedpan, or urinal?: A Little Help from another person bathing (including washing, rinsing, drying)?: A Little Help from another person to put on and taking off regular upper body clothing?: A Little Help from another person to put on and taking off regular lower body clothing?: A Little 6 Click Score: 19   End of Session Equipment Utilized During Treatment: Rolling walker (2 wheels)  Activity Tolerance: Patient tolerated treatment well Patient left: in chair;with call bell/phone within reach;with chair alarm set  OT Visit Diagnosis: Unsteadiness on feet (R26.81);Other abnormalities of gait and mobility (R26.89);Muscle weakness (generalized) (M62.81);Other symptoms and signs involving the nervous system (V40.981)                Time: 1914-7829 OT  Time Calculation (min): 19 min Charges:  OT General Charges $OT Visit: 1 Visit OT Evaluation $OT Eval Low Complexity: 1 Low  Zackarey Holleman OT, MOT   Danie Chandler 11/18/2023, 9:59 AM

## 2023-11-18 NOTE — Care Management Obs Status (Signed)
MEDICARE OBSERVATION STATUS NOTIFICATION   Patient Details  Name: Amy Calderon MRN: [id number] Date of Birth: Jun 09, 1926   Medicare Observation Status Notification Given:  Yes (certified copy mailed to legal guardian [name] at [facility] [address] [place], [place] [address])    Corey Harold 11/18/2023, 2:27 PM

## 2023-11-19 DIAGNOSIS — R2689 Other abnormalities of gait and mobility: Secondary | ICD-10-CM | POA: Diagnosis not present

## 2023-11-20 DIAGNOSIS — M6281 Muscle weakness (generalized): Secondary | ICD-10-CM | POA: Diagnosis not present

## 2023-11-24 DIAGNOSIS — I639 Cerebral infarction, unspecified: Secondary | ICD-10-CM | POA: Diagnosis not present

## 2023-11-24 DIAGNOSIS — Z6828 Body mass index (BMI) 28.0-28.9, adult: Secondary | ICD-10-CM | POA: Diagnosis not present

## 2023-11-24 DIAGNOSIS — G458 Other transient cerebral ischemic attacks and related syndromes: Secondary | ICD-10-CM | POA: Diagnosis not present

## 2023-11-24 DIAGNOSIS — I10 Essential (primary) hypertension: Secondary | ICD-10-CM | POA: Diagnosis not present

## 2023-11-25 DIAGNOSIS — M6281 Muscle weakness (generalized): Secondary | ICD-10-CM | POA: Diagnosis not present

## 2023-11-25 DIAGNOSIS — I639 Cerebral infarction, unspecified: Secondary | ICD-10-CM | POA: Diagnosis not present

## 2023-11-26 DIAGNOSIS — M6281 Muscle weakness (generalized): Secondary | ICD-10-CM | POA: Diagnosis not present

## 2023-11-28 DIAGNOSIS — R2689 Other abnormalities of gait and mobility: Secondary | ICD-10-CM | POA: Diagnosis not present

## 2023-12-01 DIAGNOSIS — R2689 Other abnormalities of gait and mobility: Secondary | ICD-10-CM | POA: Diagnosis not present

## 2023-12-03 DIAGNOSIS — R2689 Other abnormalities of gait and mobility: Secondary | ICD-10-CM | POA: Diagnosis not present

## 2023-12-04 DIAGNOSIS — M6281 Muscle weakness (generalized): Secondary | ICD-10-CM | POA: Diagnosis not present

## 2023-12-05 DIAGNOSIS — R2689 Other abnormalities of gait and mobility: Secondary | ICD-10-CM | POA: Diagnosis not present

## 2023-12-05 DIAGNOSIS — M6281 Muscle weakness (generalized): Secondary | ICD-10-CM | POA: Diagnosis not present

## 2023-12-09 DIAGNOSIS — M6281 Muscle weakness (generalized): Secondary | ICD-10-CM | POA: Diagnosis not present

## 2023-12-10 DIAGNOSIS — R2689 Other abnormalities of gait and mobility: Secondary | ICD-10-CM | POA: Diagnosis not present

## 2023-12-11 DIAGNOSIS — M6281 Muscle weakness (generalized): Secondary | ICD-10-CM | POA: Diagnosis not present

## 2023-12-12 DIAGNOSIS — R2689 Other abnormalities of gait and mobility: Secondary | ICD-10-CM | POA: Diagnosis not present

## 2023-12-15 DIAGNOSIS — R2689 Other abnormalities of gait and mobility: Secondary | ICD-10-CM | POA: Diagnosis not present

## 2023-12-16 DIAGNOSIS — M6281 Muscle weakness (generalized): Secondary | ICD-10-CM | POA: Diagnosis not present

## 2023-12-18 DIAGNOSIS — Z974 Presence of external hearing-aid: Secondary | ICD-10-CM | POA: Diagnosis not present

## 2023-12-18 DIAGNOSIS — M6281 Muscle weakness (generalized): Secondary | ICD-10-CM | POA: Diagnosis not present

## 2023-12-19 DIAGNOSIS — R2689 Other abnormalities of gait and mobility: Secondary | ICD-10-CM | POA: Diagnosis not present

## 2023-12-23 DIAGNOSIS — M6281 Muscle weakness (generalized): Secondary | ICD-10-CM | POA: Diagnosis not present

## 2023-12-23 DIAGNOSIS — R2689 Other abnormalities of gait and mobility: Secondary | ICD-10-CM | POA: Diagnosis not present

## 2023-12-25 DIAGNOSIS — R2689 Other abnormalities of gait and mobility: Secondary | ICD-10-CM | POA: Diagnosis not present

## 2023-12-25 DIAGNOSIS — M6281 Muscle weakness (generalized): Secondary | ICD-10-CM | POA: Diagnosis not present

## 2023-12-29 DIAGNOSIS — R2689 Other abnormalities of gait and mobility: Secondary | ICD-10-CM | POA: Diagnosis not present

## 2023-12-30 DIAGNOSIS — M6281 Muscle weakness (generalized): Secondary | ICD-10-CM | POA: Diagnosis not present

## 2023-12-31 DIAGNOSIS — R2689 Other abnormalities of gait and mobility: Secondary | ICD-10-CM | POA: Diagnosis not present

## 2024-01-01 DIAGNOSIS — M6281 Muscle weakness (generalized): Secondary | ICD-10-CM | POA: Diagnosis not present

## 2024-01-05 DIAGNOSIS — R2689 Other abnormalities of gait and mobility: Secondary | ICD-10-CM | POA: Diagnosis not present

## 2024-01-06 DIAGNOSIS — M6281 Muscle weakness (generalized): Secondary | ICD-10-CM | POA: Diagnosis not present

## 2024-01-07 DIAGNOSIS — R2689 Other abnormalities of gait and mobility: Secondary | ICD-10-CM | POA: Diagnosis not present

## 2024-01-08 DIAGNOSIS — M6281 Muscle weakness (generalized): Secondary | ICD-10-CM | POA: Diagnosis not present

## 2024-01-12 DIAGNOSIS — R2689 Other abnormalities of gait and mobility: Secondary | ICD-10-CM | POA: Diagnosis not present

## 2024-01-13 DIAGNOSIS — M6281 Muscle weakness (generalized): Secondary | ICD-10-CM | POA: Diagnosis not present

## 2024-01-14 DIAGNOSIS — M6281 Muscle weakness (generalized): Secondary | ICD-10-CM | POA: Diagnosis not present

## 2024-01-16 DIAGNOSIS — R2689 Other abnormalities of gait and mobility: Secondary | ICD-10-CM | POA: Diagnosis not present

## 2024-01-19 DIAGNOSIS — R2689 Other abnormalities of gait and mobility: Secondary | ICD-10-CM | POA: Diagnosis not present

## 2024-01-20 ENCOUNTER — Other Ambulatory Visit: Payer: Self-pay

## 2024-01-20 ENCOUNTER — Emergency Department (HOSPITAL_COMMUNITY)

## 2024-01-20 ENCOUNTER — Encounter (HOSPITAL_COMMUNITY): Payer: Self-pay

## 2024-01-20 ENCOUNTER — Inpatient Hospital Stay (HOSPITAL_COMMUNITY)

## 2024-01-20 ENCOUNTER — Observation Stay (HOSPITAL_COMMUNITY)
Admission: EM | Admit: 2024-01-20 | Discharge: 2024-01-22 | Disposition: A | Discharge status: Home / Self Care | Attending: Family Medicine | Admitting: Family Medicine

## 2024-01-20 DIAGNOSIS — Z79899 Other long term (current) drug therapy: Secondary | ICD-10-CM | POA: Diagnosis not present

## 2024-01-20 DIAGNOSIS — K831 Obstruction of bile duct: Secondary | ICD-10-CM | POA: Diagnosis not present

## 2024-01-20 DIAGNOSIS — Z8673 Personal history of transient ischemic attack (TIA), and cerebral infarction without residual deficits: Secondary | ICD-10-CM | POA: Diagnosis not present

## 2024-01-20 DIAGNOSIS — R110 Nausea: Secondary | ICD-10-CM | POA: Diagnosis present

## 2024-01-20 DIAGNOSIS — I159 Secondary hypertension, unspecified: Secondary | ICD-10-CM

## 2024-01-20 DIAGNOSIS — K8000 Calculus of gallbladder with acute cholecystitis without obstruction: Secondary | ICD-10-CM | POA: Diagnosis not present

## 2024-01-20 DIAGNOSIS — K838 Other specified diseases of biliary tract: Secondary | ICD-10-CM | POA: Diagnosis not present

## 2024-01-20 DIAGNOSIS — I10 Essential (primary) hypertension: Secondary | ICD-10-CM | POA: Diagnosis not present

## 2024-01-20 DIAGNOSIS — R933 Abnormal findings on diagnostic imaging of other parts of digestive tract: Secondary | ICD-10-CM | POA: Diagnosis not present

## 2024-01-20 DIAGNOSIS — I639 Cerebral infarction, unspecified: Secondary | ICD-10-CM | POA: Diagnosis not present

## 2024-01-20 DIAGNOSIS — R932 Abnormal findings on diagnostic imaging of liver and biliary tract: Secondary | ICD-10-CM | POA: Diagnosis not present

## 2024-01-20 DIAGNOSIS — R17 Unspecified jaundice: Principal | ICD-10-CM | POA: Diagnosis present

## 2024-01-20 DIAGNOSIS — Z7901 Long term (current) use of anticoagulants: Secondary | ICD-10-CM | POA: Insufficient documentation

## 2024-01-20 DIAGNOSIS — K8020 Calculus of gallbladder without cholecystitis without obstruction: Secondary | ICD-10-CM | POA: Diagnosis not present

## 2024-01-20 DIAGNOSIS — K219 Gastro-esophageal reflux disease without esophagitis: Secondary | ICD-10-CM

## 2024-01-20 DIAGNOSIS — C221 Intrahepatic bile duct carcinoma: Secondary | ICD-10-CM | POA: Diagnosis not present

## 2024-01-20 DIAGNOSIS — R935 Abnormal findings on diagnostic imaging of other abdominal regions, including retroperitoneum: Secondary | ICD-10-CM | POA: Diagnosis not present

## 2024-01-20 DIAGNOSIS — K449 Diaphragmatic hernia without obstruction or gangrene: Secondary | ICD-10-CM | POA: Diagnosis not present

## 2024-01-20 LAB — CBC WITH DIFFERENTIAL/PLATELET
Abs Immature Granulocytes: 0.02 10*3/uL (ref 0.00–0.07)
Basophils Absolute: 0 10*3/uL (ref 0.0–0.1)
Basophils Relative: 1 %
Eosinophils Absolute: 0.2 10*3/uL (ref 0.0–0.5)
Eosinophils Relative: 3 %
HCT: 41.4 % (ref 36.0–46.0)
Hemoglobin: 13.7 g/dL (ref 12.0–15.0)
Immature Granulocytes: 0 %
Lymphocytes Relative: 37 %
Lymphs Abs: 2.5 10*3/uL (ref 0.7–4.0)
MCH: 26.3 pg (ref 26.0–34.0)
MCHC: 33.1 g/dL (ref 30.0–36.0)
MCV: 79.6 fL — ABNORMAL LOW (ref 80.0–100.0)
Monocytes Absolute: 0.7 10*3/uL (ref 0.1–1.0)
Monocytes Relative: 10 %
Neutro Abs: 3.4 10*3/uL (ref 1.7–7.7)
Neutrophils Relative %: 49 %
Platelets: 325 10*3/uL (ref 150–400)
RBC: 5.2 MIL/uL — ABNORMAL HIGH (ref 3.87–5.11)
RDW: 20.6 % — ABNORMAL HIGH (ref 11.5–15.5)
WBC: 6.9 10*3/uL (ref 4.0–10.5)
nRBC: 0 % (ref 0.0–0.2)

## 2024-01-20 LAB — URINALYSIS, ROUTINE W REFLEX MICROSCOPIC
Bacteria, UA: NONE SEEN
Bilirubin Urine: NEGATIVE
Glucose, UA: NEGATIVE mg/dL
Ketones, ur: NEGATIVE mg/dL
Leukocytes,Ua: NEGATIVE
Nitrite: NEGATIVE
Protein, ur: NEGATIVE mg/dL
Specific Gravity, Urine: 1.021 (ref 1.005–1.030)
pH: 6 (ref 5.0–8.0)

## 2024-01-20 LAB — PROTIME-INR
INR: 1.2 (ref 0.8–1.2)
Prothrombin Time: 15.4 s — ABNORMAL HIGH (ref 11.4–15.2)

## 2024-01-20 LAB — COMPREHENSIVE METABOLIC PANEL WITH GFR
ALT: 56 U/L — ABNORMAL HIGH (ref 0–44)
AST: 94 U/L — ABNORMAL HIGH (ref 15–41)
Albumin: 3.1 g/dL — ABNORMAL LOW (ref 3.5–5.0)
Alkaline Phosphatase: 350 U/L — ABNORMAL HIGH (ref 38–126)
Anion gap: 12 (ref 5–15)
BUN: 12 mg/dL (ref 8–23)
CO2: 23 mmol/L (ref 22–32)
Calcium: 9.4 mg/dL (ref 8.9–10.3)
Chloride: 102 mmol/L (ref 98–111)
Creatinine, Ser: 0.64 mg/dL (ref 0.44–1.00)
GFR, Estimated: >60 mL/min (ref >60)
Glucose, Bld: 111 mg/dL — ABNORMAL HIGH (ref 70–99)
Potassium: 3.7 mmol/L (ref 3.5–5.1)
Sodium: 137 mmol/L (ref 135–145)
Total Bilirubin: 13 mg/dL — ABNORMAL HIGH (ref 0.0–1.2)
Total Protein: 6.4 g/dL — ABNORMAL LOW (ref 6.5–8.1)

## 2024-01-20 MED ORDER — MORPHINE SULFATE (PF) 2 MG/ML IV SOLN: 1.0000 mg | INTRAVENOUS | Status: DC | PRN

## 2024-01-20 MED ORDER — ASPIRIN 81 MG PO TBEC
81.0000 mg | DELAYED_RELEASE_TABLET | Freq: Every day | ORAL | Status: DC
  Administered 2024-01-21 – 2024-01-22 (×2): 81 mg via ORAL
  Filled 2024-01-20 (×2): qty 1

## 2024-01-20 MED ORDER — ENOXAPARIN SODIUM 40 MG/0.4ML IJ SOSY
40.0000 mg | PREFILLED_SYRINGE | INTRAMUSCULAR | Status: DC
  Administered 2024-01-20: 40 mg via SUBCUTANEOUS
  Filled 2024-01-20 (×2): qty 0.4

## 2024-01-20 MED ORDER — DEXTROSE-SODIUM CHLORIDE 5-0.9 % IV SOLN: INTRAVENOUS | Status: AC

## 2024-01-20 MED ORDER — PANTOPRAZOLE SODIUM 40 MG PO TBEC: 40.0000 mg | DELAYED_RELEASE_TABLET | Freq: Every day | ORAL | Status: DC

## 2024-01-20 MED ORDER — IRBESARTAN 75 MG PO TABS
37.5000 mg | ORAL_TABLET | Freq: Every day | ORAL | Status: DC
  Administered 2024-01-21 – 2024-01-22 (×2): 37.5 mg via ORAL
  Filled 2024-01-20 (×2): qty 1

## 2024-01-20 MED ORDER — AMLODIPINE BESYLATE 5 MG PO TABS
2.5000 mg | ORAL_TABLET | Freq: Every day | ORAL | Status: DC
  Administered 2024-01-21 – 2024-01-22 (×2): 2.5 mg via ORAL
  Filled 2024-01-20 (×2): qty 1

## 2024-01-20 MED ORDER — ATORVASTATIN CALCIUM 40 MG PO TABS
40.0000 mg | ORAL_TABLET | Freq: Every day | ORAL | Status: DC
  Administered 2024-01-21 – 2024-01-22 (×2): 40 mg via ORAL
  Filled 2024-01-20 (×2): qty 1

## 2024-01-20 MED ORDER — PANTOPRAZOLE SODIUM 40 MG PO TBEC
40.0000 mg | DELAYED_RELEASE_TABLET | Freq: Two times a day (BID) | ORAL | Status: DC
  Administered 2024-01-20 – 2024-01-22 (×4): 40 mg via ORAL
  Filled 2024-01-20 (×4): qty 1

## 2024-01-20 MED ORDER — BOOST / RESOURCE BREEZE PO LIQD CUSTOM
1.0000 | Freq: Three times a day (TID) | ORAL | Status: DC
  Administered 2024-01-21 – 2024-01-22 (×4): 1 via ORAL

## 2024-01-20 MED ORDER — IOHEXOL 300 MG/ML  SOLN
100.0000 mL | Freq: Once | INTRAMUSCULAR | Status: AC | PRN
  Administered 2024-01-20: 100 mL via INTRAVENOUS

## 2024-01-20 MED ORDER — ONDANSETRON HCL 4 MG/2ML IJ SOLN: 4.0000 mg | Freq: Four times a day (QID) | INTRAMUSCULAR | Status: DC | PRN

## 2024-01-20 MED ORDER — ACETAMINOPHEN 650 MG RE SUPP: 650.0000 mg | Freq: Four times a day (QID) | RECTAL | Status: DC | PRN

## 2024-01-20 MED ORDER — ONDANSETRON HCL 4 MG/2ML IJ SOLN
4.0000 mg | Freq: Once | INTRAMUSCULAR | Status: AC
  Administered 2024-01-20: 4 mg via INTRAVENOUS
  Filled 2024-01-20: qty 2

## 2024-01-20 MED ORDER — ACETAMINOPHEN 325 MG PO TABS: 650.0000 mg | ORAL_TABLET | Freq: Four times a day (QID) | ORAL | Status: DC | PRN

## 2024-01-20 MED ORDER — ONDANSETRON HCL 4 MG PO TABS: 4.0000 mg | ORAL_TABLET | Freq: Four times a day (QID) | ORAL | Status: DC | PRN

## 2024-01-20 NOTE — Consult Note (Signed)
 @LOGO @   Referring Provider: Derwood Kaplan, MD Primary Care Physician:  [name], MD Primary Gastroenterologist:  Dr. Levon Hedger (previously unassigned)   Date of Admission: 01/20/24 Date of Consultation: 01/20/24  Reason for Consultation: Hyperbilirubinemia, abnormal CT biliary tract  HPI:  Amy Calderon is a 89 y.o. year old female with history of HTN, HLD, anxiety, depression, memory loss, acute ischemic stroke involving the pons in February 2025, currently residing at [facility] nursing home, who presented today with chief complaint of nausea.  Staff at nursing facility also reported jaundice.  ER evaluation: Vital signs within normal limits. CBC without significant abnormalities. CMP remarkable for total bilirubin 13.0, alk phos 350, AST 94, ALT 56.  No recent baseline, but LFTs and bilirubin previously normal in 2022. INR 1.2. UA negative.  CT A/P with contrast shows severe intrahepatic biliary duct dilation with a either ring-enhancing lesion or obstructive calculus in the common hepatic duct at the level of the porta hepatis with recommendations for ERCP.  Mostly decompressed gallbladder containing couple of radiopaque stones in the gallbladder neck with no changes of acute cholecystitis.  Large hiatal hernia containing significant portion of the fluid-filled, proximal stomach with circumferential wall thickening of the distal esophagus, likely due to GERD.    Consult:  Patient states she was told she was jaundiced and that's why she is here in the hospital. Denies nausea, vomiting, abdominal pain, brbpr, melena, change in bowel habits. She does not GERD symptoms from time to time. No dysphagia. States she has a good appetite and is hungry now.   Reports she has been very itchy lately and has scratches on her abdomen.     Spoke with facility staff at [facility]: They state patient reported little nausea yesterday and also today.  No vomiting or reports of pain. Had been  eating well until the last couple of days. They noticed jaundiced.  They tried to reach her primary doctor, but they could not get her RN.  They called patient's legal guardian who recommended patient going to the ER.   Staff states that patient is incompetent and Legal guardian makes all decisions for her.  Confirm medication list.  Patient is no longer taking Plavix.  She does take pantoprazole 40 mg daily.  Apparently ED staff spoke with patient's legal guardian who requested MRCP prior to committing to ERCP.    Past Medical History:  Diagnosis Date   Anxiety    Depression    Hyperlipidemia    Hypertension    Memory loss     History reviewed. No pertinent surgical history.  Prior to Admission medications   Medication Sig Start Date End Date Taking? Authorizing Provider  acetaminophen (TYLENOL) 325 MG tablet Take 2 tablets (650 mg total) by mouth every 6 (six) hours as needed for mild pain (pain score 1-3) (or Fever >/= 101). 11/18/23   Shon Hale, MD  amLODipine (NORVASC) 2.5 MG tablet Take 2.5 mg by mouth daily. 11/12/23   [provider]  aspirin EC 81 MG tablet Take 1 tablet (81 mg total) by mouth daily with breakfast. 11/18/23 11/17/24  Shon Hale, MD  atorvastatin (LIPITOR) 40 MG tablet Take 1 tablet (40 mg total) by mouth daily. 11/19/23   Shon Hale, MD  Calcium Carb-Cholecalciferol (OYSTER SHELL CALCIUM W/D) 500-5 MG-MCG TABS Take by mouth.    [provider]  Cholecalciferol 50 MCG (2000 UT) CAPS Take 1 capsule by mouth daily.    [provider]  clopidogrel (PLAVIX)  75 MG tablet Take 1 tablet (75 mg total) by mouth daily. take Aspirin 81 mg daily along with Plavix 75 mg daily for 21 days then after that STOP the Plavix  and continue ONLY Aspirin 81 mg daily indefinitely-- 11/18/23 11/17/24  Colin Dawley, MD  meclizine (ANTIVERT) 25 MG tablet Take 1 tablet (25 mg total) by mouth 3 (three) times daily as needed for dizziness. 02/26/21    Caccavale, Sophia, PA-C  olmesartan (BENICAR) 40 MG tablet Take 40 mg by mouth daily. 11/12/23   [provider]  pantoprazole (PROTONIX) 40 MG tablet Take 1 tablet (40 mg total) by mouth daily. 11/18/23 11/17/24  Colin Dawley, MD    No current facility-administered medications for this encounter.   Current Outpatient Medications  Medication Sig Dispense Refill   acetaminophen (TYLENOL) 325 MG tablet Take 2 tablets (650 mg total) by mouth every 6 (six) hours as needed for mild pain (pain score 1-3) (or Fever >/= 101).     amLODipine (NORVASC) 2.5 MG tablet Take 2.5 mg by mouth daily.     aspirin EC 81 MG tablet Take 1 tablet (81 mg total) by mouth daily with breakfast. 30 tablet 11   atorvastatin (LIPITOR) 40 MG tablet Take 1 tablet (40 mg total) by mouth daily. 30 tablet 1   Calcium Carb-Cholecalciferol (OYSTER SHELL CALCIUM W/D) 500-5 MG-MCG TABS Take by mouth.     Cholecalciferol 50 MCG (2000 UT) CAPS Take 1 capsule by mouth daily.     clopidogrel (PLAVIX) 75 MG tablet Take 1 tablet (75 mg total) by mouth daily. take Aspirin 81 mg daily along with Plavix 75 mg daily for 21 days then after that STOP the Plavix  and continue ONLY Aspirin 81 mg daily indefinitely-- 30 tablet 0   meclizine (ANTIVERT) 25 MG tablet Take 1 tablet (25 mg total) by mouth 3 (three) times daily as needed for dizziness. 10 tablet 0   olmesartan (BENICAR) 40 MG tablet Take 40 mg by mouth daily.     pantoprazole (PROTONIX) 40 MG tablet Take 1 tablet (40 mg total) by mouth daily. 30 tablet 1   promethazine-dextromethorphan (PROMETHAZINE-DM) 6.25-15 MG/5ML syrup Take 1.25 mLs by mouth 4 (four) times daily as needed for cough.      Allergies as of 01/20/2024 - Review Complete 01/20/2024  Allergen Reaction Noted   Penicillins Hives and Shortness Of Breath 03/29/2020    Family History  Family history unknown: Yes    Social History   Socioeconomic History   Marital status: Widowed    Spouse name: Not on  file   Number of children: Not on file   Years of education: Not on file   Highest education level: Not on file  Occupational History   Not on file  Tobacco Use   Smoking status: Former    Current packs/day: 0.00    Types: Cigarettes    Quit date: 10/07/1953    Years since quitting: 70.3   Smokeless tobacco: Never  Vaping Use   Vaping status: Never Used  Substance and Sexual Activity   Alcohol use: Never   Drug use: Never   Sexual activity: Not Currently  Other Topics Concern   Not on file  Social History Narrative   Not on file   Social Drivers of Health   Financial Resource Strain: Not on file  Food Insecurity: No Food Insecurity (01/20/2024)   Hunger Vital Sign    Worried About Running Out of Food in the Last Year: Never true  Ran Out of Food in the Last Year: Never true  Transportation Needs: No Transportation Needs (01/20/2024)   PRAPARE - Administrator, Civil Service (Medical): No    Lack of Transportation (Non-Medical): No  Physical Activity: Not on file  Stress: Not on file  Social Connections: Patient Declined (01/20/2024)   Social Connection and Isolation Panel [NHANES]    Frequency of Communication with Friends and Family: Patient declined    Frequency of Social Gatherings with Friends and Family: Patient declined    Attends Religious Services: Patient declined    Database administrator or Organizations: Patient declined    Attends Banker Meetings: Patient declined    Marital Status: Patient declined  Intimate Partner Violence: Not At Risk (01/20/2024)   Humiliation, Afraid, Rape, and Kick questionnaire    Fear of Current or Ex-Partner: No    Emotionally Abused: No    Physically Abused: No    Sexually Abused: No    Review of Systems: Gen: Denies fever, chills, cold or flu like symptoms, pre-syncope, or syncope.  CV: Denies chest pain, heart palpitations. Resp: Denies shortness of breath, cough.  GI: See HPI  GU : Denies  urinary burning, urinary frequency, urinary incontinence.  MS: Denies joint pain.  Derm: See HPI Psych: Denies depression, anxiety.  Heme: See HPI  Physical Exam: Vital signs in last 24 hours: Temp:  [97.6 F (36.4 C)-98.1 F (36.7 C)] 98.1 F (36.7 C) (04/15 1511) Pulse Rate:  [58-76] 60 (04/15 1530) Resp:  [16-17] 16 (04/15 1511) BP: (128-164)/(81-96) 164/90 (04/15 1530) SpO2:  [92 %-96 %] 93 % (04/15 1530) Weight:  [68 kg] 68 kg (04/15 1003)   General:   Alert,  Well-developed, well-nourished, pleasant and cooperative in NAD, jaundiced.  Head:  Normocephalic and atraumatic. Eyes:  Sclera icterus.  Ears:  Severely decreased auditory acuity. Nose:  No deformity, discharge,  or lesions. Lungs:  Clear throughout to auscultation.   No wheezes, crackles, or rhonchi. No acute distress. Heart:  Regular rate and rhythm; no murmurs, clicks, rubs,  or gallops. Abdomen:  Soft, nontender and nondistended. No masses noted. Normal bowel sounds, without guarding, and without rebound.  Scratches with scabbing diffusely. Rectal:  Deferred   Msk:  Symmetrical without gross deformities. Normal posture. Extremities:  Without edema. Neurologic:  Alert and  oriented to self and location. Unable to tell me year, president, and she doesn't grasp her current clinical picture.   Skin:  Scratches with scabbing on abdomen. Jaundiced.  Psych:  Normal mood and affect.  Intake/Output from previous day: No intake/output data recorded. Intake/Output this shift: No intake/output data recorded.  Lab Results: Recent Labs    01/20/24 1035  WBC 6.9  HGB 13.7  HCT 41.4  PLT 325   BMET Recent Labs    01/20/24 1035  NA 137  K 3.7  CL 102  CO2 23  GLUCOSE 111*  BUN 12  CREATININE 0.64  CALCIUM 9.4   LFT Recent Labs    01/20/24 1035  PROT 6.4*  ALBUMIN 3.1*  AST 94*  ALT 56*  ALKPHOS 350*  BILITOT 13.0*   PT/INR Recent Labs    01/20/24 1035  LABPROT 15.4*  INR 1.2     Studies/Results: CT ABDOMEN PELVIS W CONTRAST Result Date: 01/20/2024 CLINICAL DATA:  painless jaundice EXAM: CT ABDOMEN AND PELVIS WITH CONTRAST TECHNIQUE: Multidetector CT imaging of the abdomen and pelvis was performed using the standard protocol following bolus administration of intravenous contrast.  RADIATION DOSE REDUCTION: This exam was performed according to the departmental dose-optimization program which includes automated exposure control, adjustment of the mA and/or kV according to patient size and/or use of iterative reconstruction technique. CONTRAST:  100mL OMNIPAQUE IOHEXOL 300 MG/ML  SOLN COMPARISON:  Reports from May 14, 2001 FINDINGS: Lower chest: Mild cardiomegaly. Multi-vessel coronary atherosclerosis. Posterior bibasilar dependent atelectasis. Hepatobiliary: No mass.The gallbladder is mostly decompressed with a couple of radiopaque stones in the gallbladder neck.Severe intrahepatic biliary ductal dilation with either an enhancing lesion or obstructive calculus in the common hepatic duct at the level of the porta hepatis.The portal veins are patent. Pancreas: Diffuse fatty atrophy. 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 No ductal dilation.No peripancreatic inflammation or fluid collection. Spleen: Normal size. No mass. Adrenals/Urinary Tract: No adrenal masses. Multiple cysts within both kidneys. No nephrolithiasis or hydronephrosis. The urinary bladder is distended without focal abnormality. Stomach/Bowel: Large hiatal hernia containing a significant portion of the proximal stomach, which is fluid-filled. Circumferential wall thickening of the partially visualized distal esophagus. No small bowel wall thickening or inflammation. No small bowel obstruction. Normal appendix. Total colonic diverticulosis. No changes of acute diverticulitis. Vascular/Lymphatic: No aortic aneurysm. Diffuse aortoiliac  atherosclerosis. No intraabdominal or pelvic lymphadenopathy. Reproductive: Age-related atrophy of the uterus and ovaries. Right ovarian cyst measuring 3 cm.No free pelvic fluid. Other: No pneumoperitoneum, ascites, or mesenteric inflammation. Musculoskeletal: No acute fracture or destructive lesion.Moderate S-shaped curvature of the thoracolumbar spine. Multilevel degenerative disc disease of the spine. Osteopenia. IMPRESSION: 1. Severe intrahepatic biliary ductal dilation with either ring-enhancing lesion or obstructive calculus in the common hepatic duct at the level of the porta hepatis (coronal 36, axial 26). GI consultation for ERCP recommended. 2. Mostly decompressed gallbladder containing a couple of radiopaque stones in the gallbladder neck. No changes of acute cholecystitis. 3. Large hiatal hernia containing a significant portion of the fluid-filled, proximal stomach. Circumferential wall thickening of the distal esophagus, likely due to gastroesophageal reflux disease. 4. Right ovarian cyst measuring 3 cm. Electronically Signed   By: Rance Burrows M.D.   On: 01/20/2024 14:45    Impression: 89 y.o. year old female with history of HTN, HLD, anxiety, depression, memory loss, acute ischemic stroke involving the pons in February 2025, currently residing at [facility] nursing home, who presented today with chief complaint of nausea and facility staff noticing jaundice. ED work-up remarkable for bilirubin 13.0, alk phos 350, AST 94, ALT 56.  CT with severe intrahepatic biliary duct dilation with either ring-enhancing lesion or obstructive calculus in the CBD at the level of the porta hepatis with recommendations for ERCP.  Mostly decompressed gallbladder containing radiopaque stones in the gallbladder neck with no acute cholecystitis.  Large fluid-filled hiatal hernia with circumferential wall thickening of the distal esophagus likely due to GERD.  GI consulted for further evaluation.  Biliary  obstruction/painless jaundice: - Asymptomatic at this time. - CT with severe intrahepatic biliary duct dilation with either ring-enhancing lesion or obstructive calculus in the CBD at the level of the porta hepatis.  - Ideally needs ERCP for further evaluation, biliary decompression, and sampling if evidence of malignancy though at her age, she is high risk. ED provider spoke with legal guardian who prefers MRCP prior to making decision regarding ERCP.  - We will perform MRCP here at [facility].  If the decision is made to pursue ERCP, she will need transfer to [facility]. - Notably, plavix on outpatient med list,  but confirmed with [facility] that patient is no longer on Plavix.  This was to be a 30-day course after her stroke in February.  She is maintained on daily aspirin.   Esophageal wall thickening: -  Circumferential wall thickening of the distal esophagus noted on CT. Likely secondary to GERD. Denies Dysphagia. Chronically on PPI daily outpatient. Will increase PPI to twice daily.   Plan: MRCP Pending MRCP, discussion will need to be had with legal guardian regarding possibility of ERCP though patient is very high risk.  If there is concern for malignancy, would likely be best to get palliative care involved to discuss goals of care considering risk of procedures, age, etc.  PPI twice daily Continue supportive measures. Monitor for signs of cholangitis.    LOS: 0 days    01/20/2024, 3:51 PM   Shana Daring, [facility]

## 2024-01-20 NOTE — Assessment & Plan Note (Signed)
 Continue blood pressure control with amlodipine and olmesartan.  Close blood pressure monitoring.

## 2024-01-20 NOTE — Assessment & Plan Note (Signed)
 Continue blood pressure control.  Continue statin therapy and antiplatelet with aspirin.

## 2024-01-20 NOTE — ED Triage Notes (Signed)
 Patient brought by [facility] from [facility] for complaints of nausea. [place] told EMS she also looked jaundice. Patient denies vomiting.

## 2024-01-20 NOTE — Assessment & Plan Note (Signed)
Continue antiacid therapy with pantoprazole.  ?

## 2024-01-20 NOTE — ED Provider Notes (Signed)
 [place] EMERGENCY DEPARTMENT AT [facility] Provider Note   CSN: [id number] Arrival date & time: 01/20/24  0945     History {Add pertinent medical, surgical, social history, OB history to HPI:1} Chief Complaint  Patient presents with   Nausea    Amy Calderon is a 89 y.o. female.  HPI    89 year old female with history of stroke, hypertension comes in with chief complaint of nausea from nursing home.  Patient currently resides at [facility] nursing home.  Patient states that she sometimes feels nauseous.  She sometimes has abdominal pain.  Currently she feels well.  When I discussed with her that her skin looks slightly more pale, patient commented " why is that" she is unsure how long she has had skin changes.  Review of system is negative for fevers, chills.  Patient's guardian is listed as [facility].  Home Medications Prior to Admission medications   Medication Sig Start Date End Date Taking? Authorizing Provider  acetaminophen (TYLENOL) 325 MG tablet Take 2 tablets (650 mg total) by mouth every 6 (six) hours as needed for mild pain (pain score 1-3) (or Fever >/= 101). 11/18/23   Shon Hale, MD  amLODipine (NORVASC) 2.5 MG tablet Take 2.5 mg by mouth daily. 11/12/23   [provider]  aspirin EC 81 MG tablet Take 1 tablet (81 mg total) by mouth daily with breakfast. 11/18/23 11/17/24  Shon Hale, MD  atorvastatin (LIPITOR) 40 MG tablet Take 1 tablet (40 mg total) by mouth daily. 11/19/23   Shon Hale, MD  Calcium Carb-Cholecalciferol (OYSTER SHELL CALCIUM W/D) 500-5 MG-MCG TABS Take by mouth.    [provider]  Cholecalciferol 50 MCG (2000 UT) CAPS Take 1 capsule by mouth daily.    [provider]  clopidogrel (PLAVIX) 75 MG tablet Take 1 tablet (75 mg total) by mouth daily. take Aspirin 81 mg daily along with Plavix 75 mg daily for 21 days then after that STOP the Plavix  and continue ONLY Aspirin 81 mg daily indefinitely--  11/18/23 11/17/24  Shon Hale, MD  meclizine (ANTIVERT) 25 MG tablet Take 1 tablet (25 mg total) by mouth 3 (three) times daily as needed for dizziness. 02/26/21   Caccavale, Sophia, PA-C  olmesartan (BENICAR) 40 MG tablet Take 40 mg by mouth daily. 11/12/23   [provider]  pantoprazole (PROTONIX) 40 MG tablet Take 1 tablet (40 mg total) by mouth daily. 11/18/23 11/17/24  Shon Hale, MD      Allergies    Penicillins    Review of Systems   Review of Systems  All other systems reviewed and are negative.   Physical Exam Updated Vital Signs BP 138/87 (BP Location: Left Arm)   Pulse 64   Temp 97.6 F (36.4 C) (Oral)   Resp 17   Ht 5\' 3"  (1.6 m)   Wt 68 kg   SpO2 94%   BMI 26.57 kg/m  Physical Exam Vitals and nursing note reviewed.  Constitutional:      Appearance: She is well-developed.  HENT:     Head: Atraumatic.  Eyes:     General: Scleral icterus present.  Cardiovascular:     Rate and Rhythm: Normal rate.  Pulmonary:     Effort: Pulmonary effort is normal.  Abdominal:     Palpations: There is no mass.     Tenderness: There is no abdominal tenderness.  Musculoskeletal:     Cervical back: Normal range of motion and neck supple.  Skin:  General: Skin is warm and dry.     Coloration: Skin is jaundiced.  Neurological:     Mental Status: She is alert and oriented to person, place, and time.     ED Results / Procedures / Treatments   Labs (all labs ordered are listed, but only abnormal results are displayed) Labs Reviewed  CBC WITH DIFFERENTIAL/PLATELET - Abnormal; Notable for the following components:      Result Value   RBC 5.20 (*)    MCV 79.6 (*)    RDW 20.6 (*)    All other components within normal limits  COMPREHENSIVE METABOLIC PANEL WITH GFR  PROTIME-INR  URINALYSIS, ROUTINE W REFLEX MICROSCOPIC    EKG None  Radiology No results found.  Procedures Procedures  {Document cardiac monitor, telemetry assessment procedure when  appropriate:1}  Medications Ordered in ED Medications  ondansetron (ZOFRAN) injection 4 mg (has no administration in time range)    ED Course/ Medical Decision Making/ A&P   {   Click here for ABCD2, HEART and other calculatorsREFRESH Note before signing :1}                              Medical Decision Making Amount and/or Complexity of Data Reviewed Labs: ordered. Radiology: ordered.  Risk Prescription drug management.  This patient presents to the ED with chief complaint(s) of nausea with pertinent past medical history of stroke, hypertension.  Patient also noted to be jaundiced.she denies any abdominal pain.The complaint involves an extensive differential diagnosis and also carries with it a high risk of complications and morbidity.    The differential diagnosis includes : Hepatobiliary pathology including pancreatic mass, liver mass/cancer.  Painless jaundice is cancer and to prone otherwise.  Additionally patient complains of nausea, which could be because of electrolyte abnormality, severe jaundice.  Her abdominal exam is reassuring.  Patient is not very direct with her complaints, indicating that she off-and-on has nausea and abdominal discomfort.  The initial plan is to get basic labs, CT abdomen pelvis with contrast.   Additional history obtained: Additional history obtained from nursing home/care facility Records reviewed previous admission documents  Independent labs interpretation:  The following labs were independently interpreted: ***  Independent visualization and interpretation of imaging: - I independently visualized the following imaging with scope of interpretation limited to determining acute life threatening conditions related to emergency care: ***, which revealed ***  Treatment and Reassessment: ***  Consultation: - Consulted or discussed management/test interpretation with external professional: ***  Consideration for admission or further  workup:  Social Determinants of health:   Final Clinical Impression(s) / ED Diagnoses Final diagnoses:  None    Rx / DC Orders ED Discharge Orders     None

## 2024-01-20 NOTE — ED Notes (Signed)
 Patient transported to CT

## 2024-01-20 NOTE — H&P (Signed)
 History and Physical    Patient: Amy Calderon YNW:[id number] DOB: 1926/10/02 DOA: 01/20/2024 DOS: the patient was seen and examined on 01/20/2024 PCP: [name], MD  Patient coming from: Home  Chief Complaint:  Chief Complaint  Patient presents with   Nausea   HPI: Amy Calderon is a 89 y.o. female nursing home resident, (she has a state appointed legal guardian) with past medical history significant of hypertension, GERD, CVA, hyperlipidemia, depression and cognitive impairment who presented with jaundice.  Recent hospitalization from 11/17/23 to 11/18/23 for acute ischemic stroke.   She reports 2 days of yellow skin discoloration. Today she was having nausea that prompted nursing home staff to call EMS. She was noted to have severe jaundice and decision was made to transport her to the hospital.   On direct questioning at the time of my examination she declines any abdominal pain, no fever or chills, no current nausea or vomiting.  No diarrhea, melena or hematochezia.  No change in stool or urine color that she has noticed.   Review of Systems: As mentioned in the history of present illness. All other systems reviewed and are negative. Past Medical History:  Diagnosis Date   Anxiety    Depression    Hyperlipidemia    Hypertension    Memory loss    History reviewed. No pertinent surgical history. Social History:  reports that she quit smoking about 70 years ago. Her smoking use included cigarettes. She has never used smokeless tobacco. She reports that she does not drink alcohol and does not use drugs.  Allergies  Allergen Reactions   Penicillins Hives and Shortness Of Breath    Family History  Family history unknown: Yes    Prior to Admission medications   Medication Sig Start Date End Date Taking? Authorizing Provider  acetaminophen (TYLENOL) 325 MG tablet Take 2 tablets (650 mg total) by mouth every 6 (six) hours as needed for mild pain (pain score 1-3) (or  Fever >/= 101). 11/18/23   Shon Hale, MD  amLODipine (NORVASC) 2.5 MG tablet Take 2.5 mg by mouth daily. 11/12/23   [provider]  aspirin EC 81 MG tablet Take 1 tablet (81 mg total) by mouth daily with breakfast. 11/18/23 11/17/24  Shon Hale, MD  atorvastatin (LIPITOR) 40 MG tablet Take 1 tablet (40 mg total) by mouth daily. 11/19/23   Shon Hale, MD  Calcium Carb-Cholecalciferol (OYSTER SHELL CALCIUM W/D) 500-5 MG-MCG TABS Take by mouth.    [provider]  Cholecalciferol 50 MCG (2000 UT) CAPS Take 1 capsule by mouth daily.    [provider]  clopidogrel (PLAVIX) 75 MG tablet Take 1 tablet (75 mg total) by mouth daily. take Aspirin 81 mg daily along with Plavix 75 mg daily for 21 days then after that STOP the Plavix  and continue ONLY Aspirin 81 mg daily indefinitely-- 11/18/23 11/17/24  Shon Hale, MD  meclizine (ANTIVERT) 25 MG tablet Take 1 tablet (25 mg total) by mouth 3 (three) times daily as needed for dizziness. 02/26/21   Caccavale, Sophia, PA-C  olmesartan (BENICAR) 40 MG tablet Take 40 mg by mouth daily. 11/12/23   [provider]  pantoprazole (PROTONIX) 40 MG tablet Take 1 tablet (40 mg total) by mouth daily. 11/18/23 11/17/24  Shon Hale, MD  promethazine-dextromethorphan (PROMETHAZINE-DM) 6.25-15 MG/5ML syrup Take 1.25 mLs by mouth 4 (four) times daily as needed for cough. 01/02/24   [provider]    Physical Exam: Vitals:   01/20/24  1003 01/20/24 1100 01/20/24 1330 01/20/24 1400  BP:  128/81 (!) 156/94 (!) 160/88  Pulse:  74 75 (!) 58  Resp:      Temp:      TempSrc:      SpO2:  92% 92% 92%  Weight: 68 kg     Height: 5\' 3"  (1.6 m)      Neurology awake and alert, decreased hearing with no agitation, mild disorientation.  ENT with positive pallor and icterus Cardiovascular with S1 and S2 present and regular with no gallops, rubs or murmurs Respiratory with no rales or wheezing, no rhonchi  Abdomen with no  distention, no masses, no ascites. Non tender to deep palpation, no rebound or guarding  No lower extremity edema  Data Reviewed:   Na 137, K 3,7 Cl 102 bicarbonate 23, glucose 111, bun 12 cr 0.64  ALK P 350, AST 94, ALT 56 Total bilirubin 13,0  Wbc 6,9 hgb 13,7 plt 325  INR 1.2  Urine analysis SG 1,021, protein negative, small hgb, negative leukocytes.   CT abdomen and pelvis with severe intrahepatic biliary ductal dilatation with either ring enhancing lesion or obstructive calculus in the common hepatic duct at the level of the porta hepatis.  Mostly decompressed gallbladder containing a couple of radiopaque stones in the gallbladder neck. No changes of acute cholecystitis.  Large hiatal hernia containing a significant portion of the fluid filled, proximal stomach.   Assessment and Plan: * Jaundice Common hepatic duct obstruction.  Patient with no signs of cholecystitis.   Plan for further work up with MRCP.  Supportive medical therapy with IV fluids, as needed analgesics and antiemetics Antiacid therapy Clear liquids for now. Follow up with GI recommendations Hold on antibiotic therapy for now   HTN (hypertension) Continue blood pressure control with amlodipine and olmesartan.  Close blood pressure monitoring.   Acute stroke due to ischemia (HCC) Continue blood pressure control.  Continue statin therapy and antiplatelet with aspirin.   GERD (gastroesophageal reflux disease) Continue antiacid therapy with pantoprazole.      Advance Care Planning:   Code Status: Full Code   Consults: GI   Family Communication: no family at the bedside   Severity of Illness: The appropriate patient status for this patient is INPATIENT. Inpatient status is judged to be reasonable and necessary in order to provide the required intensity of service to ensure the patient's safety. The patient's presenting symptoms, physical exam findings, and initial radiographic and laboratory data in the  context of their chronic comorbidities is felt to place them at high risk for further clinical deterioration. Furthermore, it is not anticipated that the patient will be medically stable for discharge from the hospital within 2 midnights of admission.   * I certify that at the point of admission it is my clinical judgment that the patient will require inpatient hospital care spanning beyond 2 midnights from the point of admission due to high intensity of service, high risk for further deterioration and high frequency of surveillance required.*  Author: Albertus Alt, MD 01/20/2024 2:58 PM  For on call review [email or web address].

## 2024-01-20 NOTE — Assessment & Plan Note (Signed)
 Common hepatic duct obstruction.  Patient with no signs of cholecystitis.   Plan for further work up with MRCP.  Supportive medical therapy with IV fluids, as needed analgesics and antiemetics Antiacid therapy Clear liquids for now. Follow up with GI recommendations Hold on antibiotic therapy for now

## 2024-01-20 NOTE — Progress Notes (Signed)
 Pt arrived to room 320 via WC from ED. Pt ambulatory with standby assist from chair to standing scale and then to bell. Pt is very HOH and did not bring her hearing aids with her to the hospital. Pt oriented to room and safety precautions, bed alarm on and call bell within reach. Pt advised to call for needs, states understanding.

## 2024-01-21 ENCOUNTER — Encounter (HOSPITAL_COMMUNITY): Payer: Self-pay | Admitting: Internal Medicine

## 2024-01-21 ENCOUNTER — Inpatient Hospital Stay (HOSPITAL_COMMUNITY)

## 2024-01-21 DIAGNOSIS — R17 Unspecified jaundice: Secondary | ICD-10-CM | POA: Diagnosis not present

## 2024-01-21 DIAGNOSIS — K8020 Calculus of gallbladder without cholecystitis without obstruction: Secondary | ICD-10-CM | POA: Diagnosis not present

## 2024-01-21 DIAGNOSIS — C221 Intrahepatic bile duct carcinoma: Secondary | ICD-10-CM | POA: Diagnosis present

## 2024-01-21 DIAGNOSIS — Z515 Encounter for palliative care: Secondary | ICD-10-CM

## 2024-01-21 DIAGNOSIS — Z7189 Other specified counseling: Secondary | ICD-10-CM

## 2024-01-21 DIAGNOSIS — K449 Diaphragmatic hernia without obstruction or gangrene: Secondary | ICD-10-CM | POA: Diagnosis not present

## 2024-01-21 DIAGNOSIS — K838 Other specified diseases of biliary tract: Secondary | ICD-10-CM | POA: Diagnosis not present

## 2024-01-21 DIAGNOSIS — N281 Cyst of kidney, acquired: Secondary | ICD-10-CM | POA: Diagnosis not present

## 2024-01-21 MED ORDER — GADOBUTROL 1 MMOL/ML IV SOLN
5.0000 mL | Freq: Once | INTRAVENOUS | Status: AC | PRN
  Administered 2024-01-21: 5 mL via INTRAVENOUS

## 2024-01-21 MED ORDER — MIRTAZAPINE 15 MG PO TABS
7.5000 mg | ORAL_TABLET | Freq: Every day | ORAL | Status: DC
  Administered 2024-01-21: 7.5 mg via ORAL
  Filled 2024-01-21: qty 1

## 2024-01-21 MED ORDER — HYDROXYZINE HCL 10 MG PO TABS: 10.0000 mg | ORAL_TABLET | Freq: Once | ORAL | Status: DC | PRN

## 2024-01-21 MED ORDER — MELATONIN 3 MG PO TABS
6.0000 mg | ORAL_TABLET | Freq: Once | ORAL | Status: AC
  Administered 2024-01-21: 6 mg via ORAL
  Filled 2024-01-21: qty 2

## 2024-01-21 NOTE — TOC Initial Note (Signed)
 Transition of Care [facility]) - Initial/Assessment Note    Patient Details  Name: Amy Calderon MRN: [id number] Date of Birth: 1926-05-27  Transition of Care [facility]) CM/SW Contact:    Amy Cassis, LCSW Phone Number: 01/21/2024, 12:40 PM  Clinical Narrative:  Pt admitted due to jaundice.  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 Pt has legal guardian, Amy Calderon with [facility] DSS. MD has spoken with Amy Calderon and recommended hospice. Amy Calderon reports she will need to discuss with her director in order to make decision. Pt is a resident at [facility]. Per Amy Calderon at facility, they can accept pt back with [facility] if guardian is in agreement. Amy Calderon will notify LCSW when decision is made. TOC will follow.                 Expected Discharge Plan: Assisted Living Barriers to Discharge: Continued Medical Work up   Patient Goals and CMS Choice Patient states their goals for this hospitalization and ongoing recovery are:: to be determined   Choice offered to / list presented to : [facility] POA / Guardian [place] ownership interest in [facility].provided to::  (n/a)    Expected Discharge Plan and Services In-house Referral: Clinical Social Work     Living arrangements for the past 2 months: Assisted Living Facility                                      Prior Living Arrangements/Services Living arrangements for the past 2 months: Assisted Living Facility Lives with:: Facility Resident Patient language and need for interpreter reviewed:: Yes Do you feel safe going back to the place where you live?: Yes        Care giver support system in place?: Yes (comment)   Criminal Activity/Legal Involvement Pertinent to Current Situation/Hospitalization: No - Comment as needed  Activities of Daily Living   ADL Screening (condition at time of admission) Independently performs ADLs?: No Does the patient have a NEW difficulty with  bathing/dressing/toileting/self-feeding that is expected to last >3 days?: Yes (Initiates electronic notice to provider for possible OT consult) Does the patient have a NEW difficulty with getting in/out of bed, walking, or climbing stairs that is expected to last >3 days?: Yes (Initiates electronic notice to provider for possible PT consult) Does the patient have a NEW difficulty with communication that is expected to last >3 days?: No Is the patient deaf or have difficulty hearing?: Yes Does the patient have difficulty seeing, even when wearing glasses/contacts?: No Does the patient have difficulty concentrating, remembering, or making decisions?: No  Permission Sought/Granted                  Emotional Assessment         Alcohol / Substance Use: Not Applicable Psych Involvement: No (comment)  Admission diagnosis:  Jaundice [R17] Biliary obstruction [K83.1] Patient Active Problem List   Diagnosis Date Noted   Jaundice 01/20/2024   Biliary obstruction 01/20/2024   Acute stroke due to ischemia (HCC) 11/17/2023   HTN (hypertension) 11/17/2023   GERD (gastroesophageal reflux disease) 11/17/2023   PCP:  Amy Deiters, MD Pharmacy:   [facility] - [address] [place] [address] Phone: [phone] Fax: [phone]     Social Drivers of Health (SDOH) Social History: SDOH Screenings   Food Insecurity: No Food Insecurity (01/20/2024)  Housing: Low Risk  (  01/20/2024)  Transportation Needs: No Transportation Needs (01/20/2024)  Utilities: Not At Risk (01/20/2024)  Social Connections: Patient Declined (01/20/2024)  Tobacco Use: Medium Risk (01/21/2024)   SDOH Interventions:     Readmission Risk Interventions     No data to display

## 2024-01-21 NOTE — Progress Notes (Signed)
 PROGRESS NOTE     Amy Calderon, is a 89 y.o. female, DOB - 11-Nov-1925, WUJ:[id number]  Admit date - 01/20/2024   Admitting Physician Even Budlong Mariea Clonts, MD  Outpatient Primary MD for the patient is Amy Deiters, MD  LOS - 1  Chief Complaint  Patient presents with   Nausea        Brief Narrative:  89 y.o. 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  - Please see separate iPAL note  HTN (hypertension) Continue blood pressure control with amlodipine and olmesartan.  Close blood pressure monitoring.   Acute stroke due to ischemia (HCC) Continue blood pressure control.  Continue statin therapy and antiplatelet with aspirin.   GERD (gastroesophageal reflux disease) Continue antiacid therapy with pantoprazole.   Anorexia--- due to #1 above - Trial of Remeron  Disposition: The patient is from: ALF              Anticipated d/c is to: ALF              Anticipated d/c date is: 1 day              Patient currently is not medically stable to d/c. Barriers: Not Clinically Stable-   possible dc back to [facility]F with hospice care on 01/22/2024   Code Status :  -  Code Status: Limited: Do not attempt resuscitation (DNR) -DNR-LIMITED -Do Not Intubate/DNI    Family Communication:  On 01/21/24 --Face to face conversation with Amy Calderon Guardian Representative [phone] (cell); [phone] (office); Oncall (after hours)  [phone]    On 01/21/24 face to face conversation with Nephew Amy Calderon at bedside with Legal guardian present  DVT Prophylaxis  :   - SCDs  enoxaparin (LOVENOX) injection 40 mg Start: 01/20/24 2200 SCDs Start: 01/20/24 1654   Lab Results  Component Value Date   PLT 325 01/20/2024    Inpatient Medications  Scheduled Meds:  amLODipine  2.5 mg Oral Daily   aspirin EC  81 mg Oral Q breakfast   atorvastatin  40 mg Oral Daily   enoxaparin (LOVENOX) injection  40 mg Subcutaneous Q24H   feeding supplement  1 Container Oral TID BM   irbesartan  37.5 mg Oral Daily   pantoprazole  40 mg Oral BID AC   Continuous Infusions: PRN Meds:.acetaminophen **OR** acetaminophen, hydrOXYzine, morphine injection, ondansetron **OR** ondansetron (ZOFRAN) IV   Anti-infectives (From admission, onward)    None         Subjective: Jodi Marble today has no fevers, no emesis,  No chest pain,   - Poor appetite   Objective: Vitals:   01/20/24 2004 01/20/24 2357 01/21/24 0548 01/21/24 1210  BP: (!) 142/81 (!) 148/85 (!) 145/77 118/77  Pulse: 72 67 67 67  Resp: 19 17 18 17   Temp: 98.5 F (36.9 C)  98 F (36.7 C) 98.2 F (36.8 C)  TempSrc:    Oral  SpO2: 95% 94% 94% 94%  Weight:      Height:        Intake/Output Summary (Last 24 hours) at 01/21/2024  1849 Last data filed at 01/21/2024 1602 Gross per 24 hour  Intake 890.66 ml  Output --  Net 890.66 ml   Filed Weights   01/20/24 1003 01/20/24 1734  Weight: 68 kg 52.6 kg    Physical Exam  Gen:- Awake Alert,  in no apparent distress  HEENT:- [place].AT, No sclera icterus Neck-Supple Neck,No JVD,.  Lungs-  CTAB , fair symmetrical air movement CV- S1, S2 normal, regular  Abd-  +ve B.Sounds, Abd Soft, No tenderness,    Extremity/Skin:- No  edema, pedal pulses present  Psych-affect is appropriate, oriented x3 Neuro-no new focal deficits, no tremors  Data Reviewed: I have personally reviewed following labs and imaging  studies  CBC: Recent Labs  Lab 01/20/24 1035  WBC 6.9  NEUTROABS 3.4  HGB 13.7  HCT 41.4  MCV 79.6*  PLT 325   Basic Metabolic Panel: Recent Labs  Lab 01/20/24 1035  NA 137  K 3.7  CL 102  CO2 23  GLUCOSE 111*  BUN 12  CREATININE 0.64  CALCIUM 9.4   GFR: Estimated Creatinine Clearance: 30.2 mL/min (by C-G formula based on SCr of 0.64 mg/dL). Liver Function Tests: Recent Labs  Lab 01/20/24 1035  AST 94*  ALT 56*  ALKPHOS 350*  BILITOT 13.0*  PROT 6.4*  ALBUMIN 3.1*   Radiology Studies: MR ABDOMEN MRCP W WO CONTAST Result Date: 01/21/2024 CLINICAL DATA:  Jaundice; Jaundice 142209 Jaundice 142209. EXAM: MRI ABDOMEN WITHOUT AND WITH CONTRAST (INCLUDING MRCP) TECHNIQUE: Multiplanar multisequence MR imaging of the abdomen was performed both before and after the administration of intravenous contrast. Heavily T2-weighted images of the biliary and pancreatic ducts were obtained, and three-dimensional MRCP images were rendered by post processing. CONTRAST:  5mL GADAVIST GADOBUTROL 1 MMOL/ML IV SOLN COMPARISON:  CT scan abdomen and pelvis from 01/20/2024. FINDINGS: Please note, examination is moderately limited due to image degradation due to patient's motion during data acquisition. Postcontrast images are markedly limited. Lower chest: Unremarkable MR appearance to the lung bases. No pleural effusion. No pericardial effusion. Normal heart size. Hepatobiliary: The liver is normal in size and configuration. There is moderate to severe intrahepatic bile duct dilation to the level of hilum when there is marked abrupt narrowing. (Series 4, image 10 and series 8, images 9-10). In this region, there is no discrete mass however, there is infiltrating area which is diffusion restricting on high B value images and appears slightly hyperintense on T2 weighted images. The area is difficult to evaluate on the postcontrast images. However, extrahepatic bile duct is nondilated. Overall, findings  favor infiltrating is Klatskin tumor in appropriate clinical settings. Further evaluation with ERCP/tissue sampling is recommended for confirmation. The gallbladder is markedly contracted. There is small volume dependent gallstones/sludge. No abnormal wall thickening or pericholecystic fat stranding. Pancreas: Small/atrophic pancreas exhibiting multiple cystic lesions with largest in the body region measuring 1.0 x 1.4 cm. This lesion is nonenhancing on the postcontrast images. Brain pancreatic duct is not dilated. 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 There are several other smaller similar lesions, which are not well appreciated on the postcontrast images but favored to represent side branch IPMN as well. No peripancreatic fat stranding. Spleen:  Within normal limits in size and appearance. No focal mass. Adrenals/Urinary Tract: Unremarkable adrenal glands. No hydroureteronephrosis. Redemonstration of multiple simple cysts throughout bilateral kidneys with largest arising from the right kidney interpolar region, anteromedially measuring up to 1.7 x 1.8 cm. Stomach/Bowel: Visualized portions within the abdomen are unremarkable. No disproportionate dilation  of bowel loops. Vascular/Lymphatic: No pathologically enlarged lymph nodes identified. No abdominal aortic aneurysm demonstrated. No ascites. Other:  There is moderate to large partially seen hiatal hernia. Musculoskeletal: No suspicious bone lesions identified. IMPRESSION: 1. Markedly limited exam due to image degradation due to patient's motion during data acquisition. 2. There is moderate to severe intrahepatic bile duct dilation to the level of hilum where there is abrupt narrowing. In this region there is no discrete mass; however, there is infiltrating area, as described in detail above. Extrahepatic bile duct is nondilated. Overall, findings favor infiltrating Klatskin tumor in appropriate clinical settings. Further evaluation with  ERCP/tissue sampling is recommended for confirmation. 3. Multiple pancreatic cystic lesions with largest measuring up to 1.0 x 1.4 cm, favored to represent side branch IPMN. Follow-up examination is recommended in 2 years to document stability. 4. Multiple other nonacute observations, as described above. Electronically Signed   By: Beula Brunswick M.D.   On: 01/21/2024 09:52   MR 3D Recon At Scanner Result Date: 01/21/2024 CLINICAL DATA:  Jaundice; Jaundice 142209 Jaundice 142209. EXAM: MRI ABDOMEN WITHOUT AND WITH CONTRAST (INCLUDING MRCP) TECHNIQUE: Multiplanar multisequence MR imaging of the abdomen was performed both before and after the administration of intravenous contrast. Heavily T2-weighted images of the biliary and pancreatic ducts were obtained, and three-dimensional MRCP images were rendered by post processing. CONTRAST:  5mL GADAVIST GADOBUTROL 1 MMOL/ML IV SOLN COMPARISON:  CT scan abdomen and pelvis from 01/20/2024. FINDINGS: Please note, examination is moderately limited due to image degradation due to patient's motion during data acquisition. Postcontrast images are markedly limited. Lower chest: Unremarkable MR appearance to the lung bases. No pleural effusion. No pericardial effusion. Normal heart size. Hepatobiliary: The liver is normal in size and configuration. There is moderate to severe intrahepatic bile duct dilation to the level of hilum when there is marked abrupt narrowing. (Series 4, image 10 and series 8, images 9-10). In this region, there is no discrete mass however, there is infiltrating area which is diffusion restricting on high B value images and appears slightly hyperintense on T2 weighted images. The area is difficult to evaluate on the postcontrast images. However, extrahepatic bile duct is nondilated. Overall, findings favor infiltrating is Klatskin tumor in appropriate clinical settings. Further evaluation with ERCP/tissue sampling is recommended for confirmation. The  gallbladder is markedly contracted. There is small volume dependent gallstones/sludge. No abnormal wall thickening or pericholecystic fat stranding. Pancreas: Small/atrophic pancreas exhibiting multiple cystic lesions with largest in the body region measuring 1.0 x 1.4 cm. This lesion is nonenhancing on the postcontrast images. Brain pancreatic duct is not dilated. 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 There are several other smaller similar lesions, which are not well appreciated on the postcontrast images but favored to represent side branch IPMN as well. No peripancreatic fat stranding. Spleen:  Within normal limits in size and appearance. No focal mass. Adrenals/Urinary Tract: Unremarkable adrenal glands. No hydroureteronephrosis. Redemonstration of multiple simple cysts throughout bilateral kidneys with largest arising from the right kidney interpolar region, anteromedially measuring up to 1.7 x 1.8 cm. Stomach/Bowel: Visualized portions within the abdomen are unremarkable. No disproportionate dilation of bowel loops. Vascular/Lymphatic: No pathologically enlarged lymph nodes identified. No abdominal aortic aneurysm demonstrated. No ascites. Other:  There is moderate to large partially seen hiatal hernia. Musculoskeletal: No suspicious bone lesions identified. IMPRESSION: 1. Markedly limited exam due to image degradation due to patient's motion during data acquisition. 2. There is moderate to severe intrahepatic bile duct dilation to the  level of hilum where there is abrupt narrowing. In this region there is no discrete mass; however, there is infiltrating area, as described in detail above. Extrahepatic bile duct is nondilated. Overall, findings favor infiltrating Klatskin tumor in appropriate clinical settings. Further evaluation with ERCP/tissue sampling is recommended for confirmation. 3. Multiple pancreatic cystic lesions with largest measuring up to 1.0 x 1.4 cm, favored to  represent side branch IPMN. Follow-up examination is recommended in 2 years to document stability. 4. Multiple other nonacute observations, as described above. Electronically Signed   By: Jules Schick M.D.   On: 01/21/2024 09:52   CT ABDOMEN PELVIS W CONTRAST Result Date: 01/20/2024 CLINICAL DATA:  painless jaundice EXAM: CT ABDOMEN AND PELVIS WITH CONTRAST TECHNIQUE: Multidetector CT imaging of the abdomen and pelvis was performed using the standard protocol following bolus administration of intravenous contrast. RADIATION DOSE REDUCTION: This exam was performed according to the departmental dose-optimization program which includes automated exposure control, adjustment of the mA and/or kV according to patient size and/or use of iterative reconstruction technique. CONTRAST:  OMNIPAQUE IOHEXOL 300 MG/ML  SOLN COMPARISON:  Reports from May 14, 2001 FINDINGS: Lower chest: Mild cardiomegaly. Multi-vessel coronary atherosclerosis. Posterior bibasilar dependent atelectasis. Hepatobiliary: No mass.The gallbladder is mostly decompressed with a couple of radiopaque stones in the gallbladder neck.Severe intrahepatic biliary ductal dilation with either an enhancing lesion or obstructive calculus in the common hepatic duct at the level of the porta hepatis.The portal veins are patent. Pancreas: Diffuse fatty atrophy. 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 No ductal dilation.No peripancreatic inflammation or fluid collection. Spleen: Normal size. No mass. Adrenals/Urinary Tract: No adrenal masses. Multiple cysts within both kidneys. No nephrolithiasis or hydronephrosis. The urinary bladder is distended without focal abnormality. Stomach/Bowel: Large hiatal hernia containing a significant portion of the proximal stomach, which is fluid-filled. Circumferential wall thickening of the partially visualized distal esophagus. No small bowel wall  thickening or inflammation. No small bowel obstruction. Normal appendix. Total colonic diverticulosis. No changes of acute diverticulitis. Vascular/Lymphatic: No aortic aneurysm. Diffuse aortoiliac atherosclerosis. No intraabdominal or pelvic lymphadenopathy. Reproductive: Age-related atrophy of the uterus and ovaries. Right ovarian cyst measuring 3 cm.No free pelvic fluid. Other: No pneumoperitoneum, ascites, or mesenteric inflammation. Musculoskeletal: No acute fracture or destructive lesion.Moderate S-shaped curvature of the thoracolumbar spine. Multilevel degenerative disc disease of the spine. Osteopenia. IMPRESSION: 1. Severe intrahepatic biliary ductal dilation with either ring-enhancing lesion or obstructive calculus in the common hepatic duct at the level of the porta hepatis (coronal 36, axial 26). GI consultation for ERCP recommended. 2. Mostly decompressed gallbladder containing a couple of radiopaque stones in the gallbladder neck. No changes of acute cholecystitis. 3. Large hiatal hernia containing a significant portion of the fluid-filled, proximal stomach. Circumferential wall thickening of the distal esophagus, likely due to gastroesophageal reflux disease. 4. Right ovarian cyst measuring 3 cm. Electronically Signed   By: Wallie Char M.D.   On: 01/20/2024 14:45   Scheduled Meds:  amLODipine  2.5 mg Oral Daily   aspirin EC  81 mg Oral Q breakfast   atorvastatin  40 mg Oral Daily   enoxaparin (LOVENOX) injection  40 mg Subcutaneous Q24H   feeding supplement  1 Container Oral TID BM   irbesartan  37.5 mg Oral Daily   pantoprazole  40 mg Oral BID AC   Continuous Infusions:   LOS: 1 day    Shon Hale M.D on 01/21/2024 at 6:49 PM  Go  to [email or web address] - for contact info  [facility] - Office  [phone]  If 7PM-7AM, please contact night-coverage [email or web address] 01/21/2024, 6:49 PM

## 2024-01-21 NOTE — Consult Note (Signed)
 Consultation Note Date: 01/21/2024   Patient Name: Amy Calderon  DOB: 05/14/1926  MRN: [id number]  Age / Sex: 89 y.o., female  PCP: Veda Gerald, MD Referring Physician: Colin Dawley, MD  Reason for Consultation: Establishing goals of care  HPI/Patient Profile: 89 y.o. female  with past medical history of Dementia, HLD, HTN, GERD, hx depression/anxiety, [facility], seen by PMT 11/18/23 admitted on 01/20/2024 with jaundice/MRCP findings favoring infiltrating Klatskin tumor.   Clinical Assessment and Goals of Care: I have reviewed medical records including EPIC notes, labs and imaging, received report from RN, assessed the patient.  Amy Calderon is resting quietly in bed.  She appears chronically ill and somewhat frail, elderly.  She is jaundiced.  She has known memory loss but is able to tell me her name.  I do believe that she can make her basic needs known.  Her nephew and legal guardian, Myles Arvin, have just left the room.  Amy Calderon is able to tell me that she has cancer.  I reassured her multiple times that she will be cared for.  Face-to-face discussion with DSS legal guardian, Myles Arvin, and Amy Calderon nephew in the hallway.  We meet to discuss diagnosis prognosis, GOC, EOL wishes, disposition and options. I introduced Palliative Medicine as specialized medical care for people living with serious illness. It focuses on providing relief from the symptoms and stress of a serious illness. The goal is to improve quality of life for both the patient and the family.  We discussed a brief life review of the patient.  From 11/18/23 PMT note: We discussed a brief life review of the patient.  Amy Calderon shares that Amy Calderon is a widow with no children.  She has been at [facility] for several (2 to 3) years.  She has been a ward of the state for several more years.  Amy Calderon shares that Amy Calderon  mental status is okay, that she was not aware of a diagnosis of dementia although she has seen mental status decline.    We then focused on their current illness.  We talked about what is anticipated to be pancreatic cancer.  Abe Abed shares that her own mother had pancreatic cancer so she is experience.  She shares that her preference is for Amy Calderon to return to [facility] if they can take her back.  She agrees that they would not do further cancer workup or treatment.  The natural disease trajectory and expectations at EOL were discussed.  Advanced directives, concepts specific to code status, artifical feeding and hydration, and rehospitalization were considered and discussed.  We talked about DNR.  Abe Abed states that she would need to have discussions with her team, but plans to have them this afternoon.  I state that we do not need to wait for paperwork, a verbal from her would be enough to make Amy Calderon DNR.  Hospice Care services outpatient were explained and offered.  Abe Abed states that she is agreeable to hospice services.  Provider choice offered and she and nephew choose [facility].   Discussed the importance of continued conversation with family and the medical providers regarding overall plan of care and treatment options, ensuring decisions are within the context of the patient's values and GOCs. Questions and concerns were addressed.  The family was encouraged to call with questions or concerns.  PMT will continue to support holistically.  Conference with attending, GI service bedside nursing staff, transition of care team related to patient condition, needs, goals of care, disposition.   HCPOA  LEGAL GUARDIAN - legal guardian with [facility], Myles Arvin, at [phone]     SUMMARY OF RECOMMENDATIONS   Return to [facility] if they will accept her No cancer workup or treatment Hospice services with [facility] DSS team to discussed CODE STATUS, now DNR   Code  Status/Advance Care Planning: DNR - DSS team to discuss CODE STATUS, DNR  Symptom Management:  Per hospital, no additional needs at this time  Palliative Prophylaxis:  Frequent Pain Assessment and Oral Care  Additional Recommendations (Limitations, Scope, Preferences): No Chemotherapy  Psycho-social/Spiritual:  Desire for further Chaplaincy support:no Additional Recommendations: Caregiving  Support/Resources and Education on Hospice  Prognosis:  < 3 months or less anticipated based on likely pancreatic cancer, advanced age, decreasing functional status.  Discharge Planning: Anticipate return to [facility] with [facility] hospice      Primary Diagnoses: Present on Admission:  Jaundice  HTN (hypertension)  Acute stroke due to ischemia [facility] [place])  GERD (gastroesophageal reflux disease)  Biliary obstruction   I have reviewed the medical record, interviewed the patient and family, and examined the patient. The following aspects are pertinent.  Past Medical History:  Diagnosis Date   Anxiety    Depression    Hyperlipidemia    Hypertension    Memory loss    Social History   Socioeconomic History   Marital status: Widowed    Spouse name: Not on file   Number of children: Not on file   Years of education: Not on file   Highest education level: Not on file  Occupational History   Not on file  Tobacco Use   Smoking status: Former    Current packs/day: 0.00    Types: Cigarettes    Quit date: 10/07/1953    Years since quitting: 70.3   Smokeless tobacco: Never  Vaping Use   Vaping status: Never Used  Substance and Sexual Activity   Alcohol use: Never   Drug use: Never   Sexual activity: Not Currently  Other Topics Concern   Not on file  Social History Narrative   Not on file   Social Drivers of Health   Financial Resource Strain: Not on file  Food Insecurity: No Food Insecurity (01/20/2024)   Hunger Vital Sign    Worried About Running Out of Food in the Last Year:  Never true    Ran Out of Food in the Last Year: Never true  Transportation Needs: No Transportation Needs (01/20/2024)   PRAPARE - Administrator, Civil Service (Medical): No    Lack of Transportation (Non-Medical): No  Physical Activity: Not on file  Stress: Not on file  Social Connections: Patient Declined (01/20/2024)   Social Connection and Isolation Panel [NHANES]    Frequency of Communication with Friends and Family: Patient declined    Frequency of Social Gatherings with Friends and Family: Patient declined    Attends Religious Services: Patient declined    Active Member  of Clubs or Organizations: Patient declined    Attends Banker Meetings: Patient declined    Marital Status: Patient declined   Family History  Family history unknown: Yes   Scheduled Meds:  amLODipine  2.5 mg Oral Daily   aspirin EC  81 mg Oral Q breakfast   atorvastatin  40 mg Oral Daily   enoxaparin (LOVENOX) injection  40 mg Subcutaneous Q24H   feeding supplement  1 Container Oral TID BM   irbesartan  37.5 mg Oral Daily   pantoprazole  40 mg Oral BID AC   Continuous Infusions:  dextrose 5 % and 0.9 % NaCl 75 mL/hr at 01/21/24 0842   PRN Meds:.acetaminophen **OR** acetaminophen, hydrOXYzine, morphine injection, ondansetron **OR** ondansetron (ZOFRAN) IV Medications Prior to Admission:  Prior to Admission medications   Medication Sig Start Date End Date Taking? Authorizing Provider  acetaminophen (TYLENOL) 325 MG tablet Take 2 tablets (650 mg total) by mouth every 6 (six) hours as needed for mild pain (pain score 1-3) (or Fever >/= 101). 11/18/23  Yes Emokpae, Courage, MD  amLODipine (NORVASC) 2.5 MG tablet Take 2.5 mg by mouth daily. 11/12/23  Yes [provider]  aspirin EC 81 MG tablet Take 1 tablet (81 mg total) by mouth daily with breakfast. 11/18/23 11/17/24 Yes Emokpae, Courage, MD  atorvastatin (LIPITOR) 40 MG tablet Take 1 tablet (40 mg total) by mouth daily. 11/19/23   Yes Emokpae, Courage, MD  Calcium Carb-Cholecalciferol (OYSTER SHELL CALCIUM W/D) 500-5 MG-MCG TABS Take 1 tablet by mouth in the morning, at noon, and at bedtime.   Yes [provider]  Cholecalciferol 50 MCG (2000 UT) CAPS Take 1 capsule by mouth daily.   Yes [provider]  meclizine (ANTIVERT) 25 MG tablet Take 1 tablet (25 mg total) by mouth 3 (three) times daily as needed for dizziness. 02/26/21  Yes Caccavale, Sophia, PA-C  olmesartan (BENICAR) 40 MG tablet Take 40 mg by mouth daily. 11/12/23  Yes [provider]  pantoprazole (PROTONIX) 40 MG tablet Take 1 tablet (40 mg total) by mouth daily. 11/18/23 11/17/24 Yes Emokpae, Courage, MD  promethazine-dextromethorphan (PROMETHAZINE-DM) 6.25-15 MG/5ML syrup Take 1.25 mLs by mouth 4 (four) times daily as needed for cough. 01/02/24  Yes [provider]   Allergies  Allergen Reactions   Penicillins Hives and Shortness Of Breath   Review of Systems  Unable to perform ROS: Dementia    Physical Exam Vitals and nursing note reviewed.  Constitutional:      General: She is not in acute distress.    Appearance: She is ill-appearing.  HENT:     Mouth/Throat:     Mouth: Mucous membranes are moist.  Cardiovascular:     Rate and Rhythm: Normal rate.  Pulmonary:     Effort: Pulmonary effort is normal. No respiratory distress.  Skin:    General: Skin is warm and dry.     Coloration: Skin is jaundiced.  Neurological:     Mental Status: She is alert.     Comments: Known dementia but able to tell me her name     Vital Signs: BP (!) 145/77   Pulse 67   Temp 98 F (36.7 C)   Resp 18   Ht 5' (1.524 m)   Wt 52.6 kg   SpO2 94%   BMI 22.65 kg/m  Pain Scale: 0-10   Pain Score: 0-No pain   SpO2: SpO2: 94 % O2 Device:SpO2: 94 % O2 Flow Rate: .   IO:  Intake/output summary:  Intake/Output Summary (Last 24 hours) at 01/21/2024 0906 Last data filed at 01/20/2024 2050 Gross per 24 hour  Intake 240 ml   Output --  Net 240 ml    LBM: Last BM Date : 01/20/24 Baseline Weight: Weight: 68 kg Most recent weight: Weight: 52.6 kg     Palliative Assessment/Data:     Time In: 0820  Time Out: 0935 Time Total: 75 minutes  Greater than 50%  of this time was spent counseling and coordinating care related to the above assessment and plan.  Signed by: Annabelle Barrack, NP   Please contact Palliative Medicine Team phone at [phone] for questions and concerns.  For individual provider: See Tilford Foley

## 2024-01-21 NOTE — Care Management Obs Status (Signed)
 MEDICARE OBSERVATION STATUS NOTIFICATION   Patient Details  Name: Amy Calderon MRN: [id number] Date of Birth: [birth year]   Medicare Observation Status Notification Given:  Yes    Ander Katos, LCSW 01/21/2024, 2:37 PM

## 2024-01-21 NOTE — TOC Progression Note (Signed)
 Transition of Care [facility]) - Progression Note    Patient Details  Name: Amy Calderon MRN: [id number] Date of Birth: [birth year]  [facility] of [facility] CM/SW Contact  Ander Katos, [place] Phone Number: 01/21/2024, 1:10 PM  Clinical Narrative:  Abe Abed, guardian returned call and reports that she has spoken to her director and decision has been made for DNR and hospice with [facility]. Referral made. LCSW spoke with Ova Bloomer at [facility] who will follow up with [facility] to determine DME needed.      Expected Discharge Plan: Assisted Living Barriers to Discharge: Continued Medical Work up  Expected Discharge Plan and Services In-house Referral: Clinical Social Work     Living arrangements for the past 2 months: Assisted Living Facility                                       Social Determinants of Health (SDOH) Interventions SDOH Screenings   Food Insecurity: No Food Insecurity (01/20/2024)  Housing: Low Risk  (01/20/2024)  Transportation Needs: No Transportation Needs (01/20/2024)  Utilities: Not At Risk (01/20/2024)  Social Connections: Patient Declined (01/20/2024)  Tobacco Use: Medium Risk (01/21/2024)    Readmission Risk Interventions     No data to display

## 2024-01-21 NOTE — Progress Notes (Signed)
 Patient was refusing any care was not cooperative with keeping arm straight. Itching and frustrated, stated that she was tired. Dr. Elyse Hand was notified and placed orders earlier this shift.  Amy Calderon did take the melatonin she seemed to sleep after that I could not get her to take the hydroxyzine even though she needs it. She also would not cooperate with keeping her arm straight and did not get much of the IVF. she refuses to have another IV placed in . I will give her a break and task them to ry again later on day shift...Dr. Piedad Brewer

## 2024-01-21 NOTE — Plan of Care (Signed)
   Problem: Education: Goal: Knowledge of General Education information will improve Description Including pain rating scale, medication(s)/side effects and non-pharmacologic comfort measures Outcome: Progressing   Problem: Health Behavior/Discharge Planning: Goal: Ability to manage health-related needs will improve Outcome: Progressing

## 2024-01-21 NOTE — Care Management CC44 (Signed)
 Condition Code 44 Documentation Completed  Patient Details  Name: PALLAS WAHLERT MRN: [id number] Date of Birth: [birth year]   Condition Code 44 given:  Yes Patient signature on Condition Code 44 notice:  Yes Documentation of 2 MD's agreement:  Yes Code 44 added to claim:  Yes    Ander Katos, LCSW 01/21/2024, 2:37 PM

## 2024-01-21 NOTE — Progress Notes (Addendum)
  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 Marshell Skelton at bedside with Legal guardian present  Discussed with MRI/MRCP and CT abd findings ---given advance age and comorbid conditions.... -- Plan is to avoid biopsies or  further invasive investigations --infiltrating Klatskin tumor (Hilar cholangiocarcinoma) has very poor prognosis overall typically with life expectancy usually without surgery of about 7 months on the average, given patient's advanced age probably less than 7 months - -Will Recommend hospice consult to nephew and legal guardian at bedside today - Plan will be to keep patient as comfortable as possible  Ms. Wilene Hang --patient's legal guardian from [place]--will have further conversations with her supervisors regarding possibly transitioning the patient to DNR/DNI status with hospice involvement given overall poor prognosis    Interdisciplinary Goals of Care Family Meeting   Date carried out: 01/21/2024  Location of the meeting: Bedside  Member's involved: Physician, Social Worker, and Family Member or next of kin  Durable Power of Attorney or acting medical decision maker: Ms. Wilene Hang --patient's legal guardian from [place]  Discussion: We discussed goals of care for [employer] .    Code status:   Code Status: Limited: Do not attempt resuscitation (DNR) -DNR-LIMITED -Do Not Intubate/DNI    Disposition: Home with Hospice---  legal guardian/ social  services team after further discussion now request DNR/DNI status and  possible dc back to [facility] with hospice care on 01/22/2024  Total Care Time 43 mins  Colin Dawley, MD  01/21/2024, 2:31 PM

## 2024-01-21 NOTE — Progress Notes (Addendum)
 Spoke via telephone with Wilene Hang (Murrell Arrant in chart). She gave the verbal for patient, Amy Calderon, to be made DNR code statis. I have notified Dr. Quintella Buck and Arla Lab concerning consent.

## 2024-01-21 NOTE — Progress Notes (Signed)
 Subjective: Legal guardian Amy Calderon present at bedside. Patient pleasantly confused. states she feels good. She wants to go home. She is hungry. She has no GI complaints. She denies pruritus now but has had some previously.   Objective: Vital signs in last 24 hours: Temp:  [97.6 F (36.4 C)-98.5 F (36.9 C)] 98 F (36.7 C) (04/16 0548) Pulse Rate:  [58-80] 67 (04/16 0548) Resp:  [16-19] 18 (04/16 0548) BP: (128-164)/(77-96) 145/77 (04/16 0548) SpO2:  [92 %-96 %] 94 % (04/16 0548) Weight:  [52.6 kg-68 kg] 52.6 kg (04/15 1734) Last BM Date : 01/20/24 General:   Alert, pleasantly confused  Head:  Normocephalic and atraumatic. Eyes:  sclera are icteric.  Mouth:  Without lesions, mucosa pink and moist.  Heart:  S1, S2 present, no murmurs noted.  Lungs: Clear to auscultation bilaterally, without wheezing, rales, or rhonchi.  Abdomen:  Bowel sounds present, soft, non-tender, non-distended. No HSM or hernias noted. No rebound or guarding. No masses appreciated  Msk:  Symmetrical without gross deformities. Normal posture. Pulses:  Normal pulses noted. Extremities:  Without clubbing or edema. Neurologic:  Alert and pleasant, disoriented  Skin:  jaundiced, some scratch marks noted diffusely  Psych:  Alert and cooperative. Normal mood and affect.  Intake/Output from previous day: 04/15 0701 - 04/16 0700 In: 240 [P.O.:240] Out: -  Intake/Output this shift: No intake/output data recorded.  Lab Results: Recent Labs    01/20/24 1035  WBC 6.9  HGB 13.7  HCT 41.4  PLT 325   BMET Recent Labs    01/20/24 1035  NA 137  K 3.7  CL 102  CO2 23  GLUCOSE 111*  BUN 12  CREATININE 0.64  CALCIUM 9.4   LFT Recent Labs    01/20/24 1035  PROT 6.4*  ALBUMIN 3.1*  AST 94*  ALT 56*  ALKPHOS 350*  BILITOT 13.0*   PT/INR Recent Labs    01/20/24 1035  LABPROT 15.4*  INR 1.2    Studies/Results: CT ABDOMEN PELVIS W CONTRAST Result Date: 01/20/2024 CLINICAL DATA:  painless  jaundice EXAM: CT ABDOMEN AND PELVIS WITH CONTRAST TECHNIQUE: Multidetector CT imaging of the abdomen and pelvis was performed using the standard protocol following bolus administration of intravenous contrast. RADIATION DOSE REDUCTION: This exam was performed according to the departmental dose-optimization program which includes automated exposure control, adjustment of the mA and/or kV according to patient size and/or use of iterative reconstruction technique. CONTRAST:  OMNIPAQUE IOHEXOL 300 MG/ML  SOLN COMPARISON:  Reports from May 14, 2001 FINDINGS: Lower chest: Mild cardiomegaly. Multi-vessel coronary atherosclerosis. Posterior bibasilar dependent atelectasis. Hepatobiliary: No mass.The gallbladder is mostly decompressed with a couple of radiopaque stones in the gallbladder neck.Severe intrahepatic biliary ductal dilation with either an enhancing lesion or obstructive calculus in the common hepatic duct at the level of the porta hepatis.The portal veins are patent. Pancreas: Diffuse fatty atrophy. 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 No ductal dilation.No peripancreatic inflammation or fluid collection. Spleen: Normal size. No mass. Adrenals/Urinary Tract: No adrenal masses. Multiple cysts within both kidneys. No nephrolithiasis or hydronephrosis. The urinary bladder is distended without focal abnormality. Stomach/Bowel: Large hiatal hernia containing a significant portion of the proximal stomach, which is fluid-filled. Circumferential wall thickening of the partially visualized distal esophagus. No small bowel wall thickening or inflammation. No small bowel obstruction. Normal appendix. Total colonic diverticulosis. No changes of acute diverticulitis. Vascular/Lymphatic: No aortic aneurysm. Diffuse aortoiliac atherosclerosis. No intraabdominal or  pelvic lymphadenopathy. Reproductive: Age-related atrophy of the uterus and  ovaries. Right ovarian cyst measuring 3 cm.No free pelvic fluid. Other: No pneumoperitoneum, ascites, or mesenteric inflammation. Musculoskeletal: No acute fracture or destructive lesion.Moderate S-shaped curvature of the thoracolumbar spine. Multilevel degenerative disc disease of the spine. Osteopenia. IMPRESSION: 1. Severe intrahepatic biliary ductal dilation with either ring-enhancing lesion or obstructive calculus in the common hepatic duct at the level of the porta hepatis (coronal 36, axial 26). GI consultation for ERCP recommended. 2. Mostly decompressed gallbladder containing a couple of radiopaque stones in the gallbladder neck. No changes of acute cholecystitis. 3. Large hiatal hernia containing a significant portion of the fluid-filled, proximal stomach. Circumferential wall thickening of the distal esophagus, likely due to gastroesophageal reflux disease. 4. Right ovarian cyst measuring 3 cm. Electronically Signed   By: Rance Burrows M.D.   On: 01/20/2024 14:45    Assessment: 89 year old Female with PMH HTN, HLD, anxiety, depression, dementia, ischemic stroke involving the pons in February 2025, who [facility] for evaluation of painless jaundice and elevated LFTs with bilirubin 13, CT with severe intrahepatic biliary duct dilation with either ring-enhancing lesion or obstructive calculus in the CBD at the level of the porta hepatis with recommendations for ERCP, mostly decompressed gallbladder containing radiopaque stones in the gallbladder neck with no acute cholecystitis, large fluid-filled hiatal hernia with circumferential wall thickening of the distal esophagus likely due to GERD, GI consulted for further evaluation.  Biliary obstruction/painless jaundice: Asymptomatic at this time, CT as above concerning for mass versus obstructive calculus.  Ideally needs ERCP for further evaluation, biliary decompression and sampling if evidence of malignancy, though at  her age she is high risk.  EDP spoke with legal guardian yesterday who preferred MRCP prior to making decision regarding ERCP, MRCP was done yesterday, with findings concerning for Klatskin tumor. Case was discussed yesterday with advanced endoscopist Dr. Brice Campi with [facility] who felt risks for ERCP would likely outweigh benefits in this case. No evidence of cholangitis at this time. I discussed findings of MRCP with patient's legal guardian Myles Arvin, at this time would recommend palliative care input. Could potentially consider therapeutic biliary stenting.   Esophageal wall thickening: Circumferential wall thickening of the distal esophagus noted on CT, likely secondary to GERD, denies dysphagia, chronically on PPI daily outpatient, PPI was increased to twice daily yesterday.  Plan: PPI twice daily Continue supportive measures Monitor for signs of cholangitis Appreciate palliative care input Trend LFTs    LOS: 1 day    01/21/2024, 8:55 AM   Alpheus Stiff L. Olen Eaves, MSN, APRN, AGNP-C Adult-Gerontology Nurse Practitioner [facility] at [facility]

## 2024-01-21 NOTE — Progress Notes (Signed)
 Mobility Specialist Progress Note:    01/21/24 1202  Mobility  Activity Transferred from bed to chair  Level of Assistance Contact guard assist, steadying assist  Assistive Device Front wheel walker  Distance Ambulated (ft) 3 ft  Range of Motion/Exercises Active;All extremities  Activity Response Tolerated well  Mobility visit 1 Mobility  Mobility Specialist Start Time (ACUTE ONLY) 1140  Mobility Specialist Stop Time (ACUTE ONLY) 1200  Mobility Specialist Time Calculation (min) (ACUTE ONLY) 20 min   Pt received in bed, visitors at bedside. Agreeable to mobility, required CGA to stand and transfer with RW. Tolerated well, asx throughout. Left pt in chair, MD at bedside. Alarm on, call bell in reach. All needs met.  Glinda Lapping Mobility Specialist Please contact via Special educational needs teacher or  Rehab office at [phone]

## 2024-01-22 DIAGNOSIS — C221 Intrahepatic bile duct carcinoma: Principal | ICD-10-CM

## 2024-01-22 MED ORDER — MIRTAZAPINE 7.5 MG PO TABS: 7.5000 mg | ORAL_TABLET | Freq: Every day | ORAL | 1 refills | Status: AC

## 2024-01-22 NOTE — Plan of Care (Signed)
   Problem: Education: Goal: Knowledge of General Education information will improve Description: Including pain rating scale, medication(s)/side effects and non-pharmacologic comfort measures Outcome: Progressing   Problem: Safety: Goal: Ability to remain free from injury will improve Outcome: Progressing   Problem: Skin Integrity: Goal: Risk for impaired skin integrity will decrease Outcome: Progressing

## 2024-01-22 NOTE — TOC Transition Note (Signed)
 Transition of Care [facility]) - Discharge Note   Patient Details  Name: Amy Calderon MRN: [id number] Date of Birth: 1925/12/14  Transition of Care [facility]) CM/SW Contact:  Cyndie Dredge, LCSWA Phone Number: 01/22/2024, 11:01 AM   Clinical Narrative:    Patient is discharging today back to [facility]. Nurse shared that paitent LG Abe Abed has bene contacted. Spoke with Lamond Pilot with [facility] . Lamond Pilot stated that they will pick patient up. CSW will fax DC summary and FL2 to [phone]. Nurse was also provided with number to call report. TOC signing off.    Final next level of care: Assisted Living Barriers to Discharge: Barriers Resolved   Patient Goals and CMS Choice Patient states their goals for this hospitalization and ongoing recovery are:: return back to [facility] with hospice care CMS Medicare.gov Compare Post Acute Care list provided to:: Legal Guardian Choice offered to / list presented to : [facility] POA / Guardian [place] ownership interest in [facility].provided to::  (n/a)    Discharge Placement              Patient chooses bed at:  (Long-term resident at [facility]) Patient to be transferred to facility by: [facility] transportation Name of family member notified: LG- Abe Abed Patient and family notified of of transfer: 01/22/24  Discharge Plan and Services Additional resources added to the After Visit Summary for   In-house Referral: Clinical Social Work                                   Social Drivers of Health (SDOH) Interventions SDOH Screenings   Food Insecurity: No Food Insecurity (01/20/2024)  Housing: Low Risk  (01/20/2024)  Transportation Needs: No Transportation Needs (01/20/2024)  Utilities: Not At Risk (01/20/2024)  Social Connections: Patient Declined (01/20/2024)  Tobacco Use: Medium Risk (01/21/2024)     Readmission Risk Interventions    01/22/2024   10:58 AM  Readmission Risk Prevention Plan  Medication Screening Complete   Transportation Screening Complete

## 2024-01-22 NOTE — TOC Transition Note (Signed)
 Transition of Care [facility]) - Discharge Note   Patient Details  Name: Amy Calderon MRN: [id number] Date of Birth: 30-Jun-1926  Transition of Care [facility]) CM/SW Contact:  Cyndie Dredge, LCSWA Phone Number: 01/22/2024, 1:06 PM   Clinical Narrative:     Patient will discharge back to [facility] with Hospice care through [facility]; Hospice updated on DC. Lamond Pilot with [facility] stated that they would need FL2 and DC summary, which was faxed.  [facility] did confirm that they will pick patient up. TOC signing off.   Final next level of care: Assisted Living Barriers to Discharge: Barriers Resolved   Patient Goals and CMS Choice Patient states their goals for this hospitalization and ongoing recovery are:: return back to [facility] with hospice care CMS Medicare.gov Compare Post Acute Care list provided to:: Legal Guardian Choice offered to / list presented to : [facility] POA / Guardian [place] ownership interest in [facility].provided to::  (n/a)    Discharge Placement              Patient chooses bed at:  (Long-term resident at [facility]) Patient to be transferred to facility by: [facility] transportation Name of family member notified: LG- Abe Abed Patient and family notified of of transfer: 01/22/24  Discharge Plan and Services Additional resources added to the After Visit Summary for   In-house Referral: Clinical Social Work          Social Drivers of Health (SDOH) Interventions SDOH Screenings   Food Insecurity: No Food Insecurity (01/20/2024)  Housing: Low Risk  (01/20/2024)  Transportation Needs: No Transportation Needs (01/20/2024)  Utilities: Not At Risk (01/20/2024)  Social Connections: Patient Declined (01/20/2024)  Tobacco Use: Medium Risk (01/21/2024)     Readmission Risk Interventions    01/22/2024   10:58 AM  Readmission Risk Prevention Plan  Medication Screening Complete  Transportation Screening Complete

## 2024-01-22 NOTE — [place] FL2 (Signed)
 [place] MEDICAID FL2 LEVEL OF CARE FORM     IDENTIFICATION  Patient Name: Amy Calderon Birthdate: April 22, 1926 Sex: female Admission Date (Current Location): 01/20/2024  [place] and [place] Number:  Aaron Edelman [id number] P Facility and Address:  [facility],  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   Jaundice 01/20/2024   Biliary obstruction 01/20/2024   Acute stroke due to ischemia (HCC) 11/17/2023   HTN (hypertension) 11/17/2023   GERD (gastroesophageal reflux disease) 11/17/2023    Orientation RESPIRATION BLADDER Height & Weight     Self, Place    Incontinent Weight: 116 lb (52.6 kg) Height:  5' (152.4 cm)  BEHAVIORAL SYMPTOMS/MOOD NEUROLOGICAL BOWEL NUTRITION STATUS      Incontinent    AMBULATORY STATUS COMMUNICATION OF NEEDS Skin   Limited Assist Verbally Bruising, Skin abrasions                       Personal Care Assistance Level of Assistance  Bathing, Feeding, Dressing Bathing Assistance: Limited assistance Feeding assistance: Limited assistance Dressing Assistance: Limited assistance     Functional Limitations Info  Sight, Hearing, Speech Sight Info: Impaired Hearing Info: Impaired Speech Info: Adequate    SPECIAL CARE FACTORS FREQUENCY                       Contractures Contractures Info: Not present    Additional Factors Info  Code Status Code Status Info: DNR- Limited Allergies Info: Penicillins           Current Medications (01/22/2024):  This is the current hospital active  medication list Current Facility-Administered Medications  Medication Dose Route Frequency Provider Last Rate Last Admin   acetaminophen (TYLENOL) tablet 650 mg  650 mg Oral Q6H PRN Arrien, York Ram, MD       Or   acetaminophen (TYLENOL) suppository 650 mg  650 mg Rectal Q6H PRN Arrien, York Ram, MD       amLODipine (NORVASC) tablet 2.5 mg  2.5 mg Oral Daily Arrien, York Ram, MD   2.5 mg at 01/22/24 0913   aspirin EC tablet 81 mg  81 mg Oral Q breakfast Coralie Keens, MD   81 mg at 01/22/24 0913   atorvastatin (LIPITOR) tablet 40 mg  40 mg Oral Daily Arrien, York Ram, MD   40 mg at 01/22/24 0913   enoxaparin (LOVENOX) injection 40 mg  40 mg Subcutaneous Q24H Coralie Keens, MD   40 mg at 01/20/24 2126   feeding supplement (BOOST / RESOURCE BREEZE) liquid 1 Container  1 Container Oral TID BM Arrien, York Ram, MD   1 Container at 01/22/24 0914   hydrOXYzine (ATARAX) tablet 10 mg  10 mg Oral Once PRN Adefeso, Oladapo, DO       irbesartan (AVAPRO) tablet 37.5 mg  37.5 mg Oral Daily Arrien, York Ram, MD   37.5 mg at 01/22/24 0913   mirtazapine (REMERON) tablet 7.5 mg  7.5 mg Oral QHS Emokpae, Courage, MD   7.5 mg at  01/21/24 2134   morphine (PF) 2 MG/ML injection 1 mg  1 mg Intravenous Q4H PRN Arrien, Mauricio Daniel, MD       ondansetron (ZOFRAN) tablet 4 mg  4 mg Oral Q6H PRN Arrien, Mauricio Daniel, MD       Or   ondansetron (ZOFRAN) injection 4 mg  4 mg Intravenous Q6H PRN Arrien, Mauricio Daniel, MD       pantoprazole (PROTONIX) EC tablet 40 mg  40 mg Oral BID AC Harper, Kristen S, PA-C   40 mg at 01/22/24 1610     Discharge Medications: Please see discharge summary for a list of discharge medications.  Relevant Imaging Results:  Relevant Lab Results:   Additional Information SSN: [id number], LCSWA

## 2024-01-22 NOTE — Discharge Instructions (Addendum)
 hospice services with focus on comfort recommended--please Avoid invasive testing or procedures

## 2024-01-22 NOTE — Discharge Summary (Signed)
 Amy Calderon, is a 89 y.o. female  DOB 1925-10-11  MRN [id number].  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 No pertinent surgical history.  HPI  from the history and physical done on the day of admission:   HPI: Amy Calderon is a 89 y.o. female nursing home resident, (she has a state appointed legal guardian) with past medical history significant of hypertension, GERD, CVA, hyperlipidemia, depression and cognitive impairment who presented with jaundice.  Recent hospitalization from 11/17/23 to 11/18/23 for acute ischemic stroke.    She reports 2 days of yellow skin discoloration. Today she was having nausea that prompted nursing home staff to call EMS. She was noted to have severe jaundice and decision was made to transport her to the hospital.    On direct questioning at the time of my examination she declines any abdominal pain, no fever or chills, no current nausea or vomiting.  No diarrhea, melena or hematochezia.  No change in stool or urine color that  she has noticed.    Review of Systems: As mentioned in the history of present illness. All other systems reviewed and are negative.     Hospital Course:   Brief Narrative:  89 y.o. 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  - Please see separate iPAL note dated 01/21/24   2)HTN (hypertension) -stable, c/n  amlodipine and olmesartan.    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  Continue atorvastatin therapy and   aspirin for secondary prevention   GERD (gastroesophageal reflux disease) Continue  protonix    Anorexia--- due to #1 above - Trial of Remeron  on 01/22/2024 --- discussed with patient's great nephew Marvis Repress  at bedside [phone])  Disposition: The patient is from: ALF              Anticipated d/c is to: ALF   Code Status :  -  Code Status: Limited: Do not attempt resuscitation (DNR) -DNR-LIMITED -Do Not Intubate/DNI     Family Communication:  On 01/21/24 --Face to face conversation with Trilby Leaver Guardian Representative [phone] (cell); [phone] (office); Oncall (after hours) [phone]   On 01/21/24 face to  face conversation with Nephew Mr. Daphene Calamity at bedside with Legal guardian present Trilby Leaver Guardian Representative [phone] (cell); [phone] (office); Oncall (after hours) [phone] Jinny Sanders- Director [place] [facility] [phone] Fax # [phone]; [email or web address]  on 01/22/2024 --- discussed with patient's great nephew Marvis Repress  at bedside [phone])  Discharge Condition: stable   Consults obtained - Hospice  Diet and Activity recommendation:  As advised  Discharge Instructions    Discharge Instructions     Call MD for:  difficulty breathing, headache or visual disturbances   Complete by: As directed    Call MD for:  persistant dizziness or light-headedness   Complete by: As directed    Call MD for:  persistant nausea and vomiting   Complete by: As directed    Call MD for:  temperature >100.4   Complete by: As directed    Diet general   Complete by: As directed    Discharge instructions   Complete by: As directed    1) hospice services with focus on comfort recommended--please Avoid invasive testing or procedures   Increase activity slowly   Complete by: As directed          Discharge Medications     Allergies as of 01/22/2024       Reactions   Penicillins Hives, Shortness Of Breath        Medication List     STOP taking these medications    Cholecalciferol 50 MCG (2000 UT) Caps   Oyster Shell Calcium w/D 500-5 MG-MCG Tabs       TAKE these medications    acetaminophen 325 MG tablet Commonly known as: TYLENOL Take 2 tablets (650 mg total) by mouth every 6 (six) hours as needed for mild pain (pain score 1-3) (or Fever >/= 101).   amLODipine 2.5 MG tablet Commonly known as: NORVASC Take 2.5 mg by mouth daily.   aspirin EC 81 MG tablet Take 1 tablet (81 mg total) by mouth daily with breakfast.   atorvastatin 40 MG tablet Commonly known as: LIPITOR Take 1 tablet (40 mg total) by mouth  daily.   meclizine 25 MG tablet Commonly known as: ANTIVERT Take 1 tablet (25 mg total) by mouth 3 (three) times daily as needed for dizziness.   mirtazapine 7.5 MG tablet Commonly known as: REMERON Take 1 tablet (7.5 mg total) by mouth at bedtime.   olmesartan 40 MG tablet Commonly known as: BENICAR Take 40 mg by mouth daily.   pantoprazole 40 MG tablet Commonly known as: Protonix Take 1 tablet (40 mg total) by mouth daily.   promethazine-dextromethorphan 6.25-15 MG/5ML syrup Commonly known as: PROMETHAZINE-DM Take 1.25 mLs by mouth 4 (four) times daily as needed for cough.        Major procedures and Radiology Reports - PLEASE review detailed and final reports for all details, in brief -  MR ABDOMEN MRCP W WO CONTAST Result Date: 01/21/2024 CLINICAL DATA:  Jaundice; Jaundice 142209 Jaundice 142209. EXAM: MRI ABDOMEN WITHOUT AND WITH CONTRAST (INCLUDING MRCP) TECHNIQUE: Multiplanar multisequence MR imaging of the abdomen was performed both before and after the administration of intravenous contrast. Heavily T2-weighted images of the biliary and pancreatic ducts were obtained, and three-dimensional MRCP images were rendered by post processing. CONTRAST:  5mL GADAVIST GADOBUTROL 1 MMOL/ML IV SOLN COMPARISON:  CT scan abdomen and pelvis from 01/20/2024. FINDINGS: Please note, examination is moderately limited due to image degradation due to patient's motion during data acquisition. Postcontrast images are markedly limited. Lower chest: Unremarkable MR appearance to the lung bases. No pleural effusion. No pericardial effusion. Normal heart size. Hepatobiliary: The liver is normal in size and configuration. There is moderate to severe intrahepatic bile duct dilation to the level of hilum when there is marked abrupt narrowing. (Series 4, image 10 and series 8, images 9-10). In this region, there is no discrete mass however, there is infiltrating area which is diffusion restricting on high B  value images and appears slightly hyperintense on T2 weighted images. The area is difficult to evaluate on the postcontrast images. However, extrahepatic bile duct is nondilated. Overall, findings favor infiltrating is Klatskin tumor in appropriate clinical settings. Further evaluation with ERCP/tissue sampling is recommended for confirmation. The gallbladder is markedly contracted. There is small volume dependent gallstones/sludge. No abnormal wall thickening or pericholecystic fat stranding. Pancreas: Small/atrophic pancreas exhibiting multiple cystic lesions with largest in the body region measuring 1.0 x 1.4 cm. This lesion is nonenhancing on the postcontrast images. Brain pancreatic duct is not dilated. 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 There are several other smaller similar lesions, which are not well appreciated on the postcontrast images but favored to represent side branch IPMN as well. No peripancreatic fat stranding. Spleen:  Within normal limits in size and appearance. No focal mass. Adrenals/Urinary Tract: Unremarkable adrenal glands. No hydroureteronephrosis. Redemonstration of multiple simple cysts throughout bilateral kidneys with largest arising from the right kidney interpolar region, anteromedially measuring up to 1.7 x 1.8 cm. Stomach/Bowel: Visualized portions within the abdomen are unremarkable. No disproportionate dilation of bowel loops. Vascular/Lymphatic: No pathologically enlarged lymph nodes identified. No abdominal aortic aneurysm demonstrated. No ascites. Other:  There is moderate to large partially seen hiatal hernia. Musculoskeletal: No suspicious bone lesions identified. IMPRESSION: 1. Markedly limited exam due to image degradation due to patient's motion during data acquisition. 2. There is moderate to severe intrahepatic bile duct dilation to the level of hilum where there is abrupt narrowing. In this region there is no discrete mass; however, there is  infiltrating area, as described in detail above. Extrahepatic bile duct is nondilated. Overall, findings favor infiltrating Klatskin tumor in appropriate clinical settings. Further evaluation with ERCP/tissue sampling is recommended for confirmation. 3. Multiple pancreatic cystic lesions with largest measuring up to 1.0 x 1.4 cm, favored to represent side branch IPMN. Follow-up examination is recommended in 2 years to document stability. 4. Multiple other nonacute observations, as described above. Electronically Signed   By: Jules Schick M.D.   On: 01/21/2024 09:52   MR 3D Recon At Scanner Result Date: 01/21/2024 CLINICAL DATA:  Jaundice; Jaundice 142209 Jaundice 142209. EXAM: MRI ABDOMEN WITHOUT AND WITH CONTRAST (INCLUDING MRCP) TECHNIQUE: Multiplanar multisequence MR imaging of the abdomen was performed both before and after the administration of intravenous contrast. Heavily T2-weighted images of the biliary and pancreatic ducts were obtained, and three-dimensional MRCP images were rendered  by post processing. CONTRAST:  5mL GADAVIST GADOBUTROL 1 MMOL/ML IV SOLN COMPARISON:  CT scan abdomen and pelvis from 01/20/2024. FINDINGS: Please note, examination is moderately limited due to image degradation due to patient's motion during data acquisition. Postcontrast images are markedly limited. Lower chest: Unremarkable MR appearance to the lung bases. No pleural effusion. No pericardial effusion. Normal heart size. Hepatobiliary: The liver is normal in size and configuration. There is moderate to severe intrahepatic bile duct dilation to the level of hilum when there is marked abrupt narrowing. (Series 4, image 10 and series 8, images 9-10). In this region, there is no discrete mass however, there is infiltrating area which is diffusion restricting on high B value images and appears slightly hyperintense on T2 weighted images. The area is difficult to evaluate on the postcontrast images. However, extrahepatic  bile duct is nondilated. Overall, findings favor infiltrating is Klatskin tumor in appropriate clinical settings. Further evaluation with ERCP/tissue sampling is recommended for confirmation. The gallbladder is markedly contracted. There is small volume dependent gallstones/sludge. No abnormal wall thickening or pericholecystic fat stranding. Pancreas: Small/atrophic pancreas exhibiting multiple cystic lesions with largest in the body region measuring 1.0 x 1.4 cm. This lesion is nonenhancing on the postcontrast images. Brain pancreatic duct is not dilated. 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 There are several other smaller similar lesions, which are not well appreciated on the postcontrast images but favored to represent side branch IPMN as well. No peripancreatic fat stranding. Spleen:  Within normal limits in size and appearance. No focal mass. Adrenals/Urinary Tract: Unremarkable adrenal glands. No hydroureteronephrosis. Redemonstration of multiple simple cysts throughout bilateral kidneys with largest arising from the right kidney interpolar region, anteromedially measuring up to 1.7 x 1.8 cm. Stomach/Bowel: Visualized portions within the abdomen are unremarkable. No disproportionate dilation of bowel loops. Vascular/Lymphatic: No pathologically enlarged lymph nodes identified. No abdominal aortic aneurysm demonstrated. No ascites. Other:  There is moderate to large partially seen hiatal hernia. Musculoskeletal: No suspicious bone lesions identified. IMPRESSION: 1. Markedly limited exam due to image degradation due to patient's motion during data acquisition. 2. There is moderate to severe intrahepatic bile duct dilation to the level of hilum where there is abrupt narrowing. In this region there is no discrete mass; however, there is infiltrating area, as described in detail above. Extrahepatic bile duct is nondilated. Overall, findings favor infiltrating Klatskin tumor in appropriate  clinical settings. Further evaluation with ERCP/tissue sampling is recommended for confirmation. 3. Multiple pancreatic cystic lesions with largest measuring up to 1.0 x 1.4 cm, favored to represent side branch IPMN. Follow-up examination is recommended in 2 years to document stability. 4. Multiple other nonacute observations, as described above. Electronically Signed   By: Beula Brunswick M.D.   On: 01/21/2024 09:52   CT ABDOMEN PELVIS W CONTRAST Result Date: 01/20/2024 CLINICAL DATA:  painless jaundice EXAM: CT ABDOMEN AND PELVIS WITH CONTRAST TECHNIQUE: Multidetector CT imaging of the abdomen and pelvis was performed using the standard protocol following bolus administration of intravenous contrast. RADIATION DOSE REDUCTION: This exam was performed according to the departmental dose-optimization program which includes automated exposure control, adjustment of the mA and/or kV according to patient size and/or use of iterative reconstruction technique. CONTRAST:  100mL OMNIPAQUE IOHEXOL 300 MG/ML  SOLN COMPARISON:  Reports from May 14, 2001 FINDINGS: Lower chest: Mild cardiomegaly. Multi-vessel coronary atherosclerosis. Posterior bibasilar dependent atelectasis. Hepatobiliary: No mass.The gallbladder is mostly decompressed with a couple of radiopaque stones in the gallbladder neck.Severe intrahepatic biliary ductal  dilation with either an enhancing lesion or obstructive calculus in the common hepatic duct at the level of the porta hepatis.The portal veins are patent. Pancreas: Diffuse fatty atrophy. 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 No ductal dilation.No peripancreatic inflammation or fluid collection. Spleen: Normal size. No mass. Adrenals/Urinary Tract: No adrenal masses. Multiple cysts within both kidneys. No nephrolithiasis or hydronephrosis. The urinary bladder is distended without focal abnormality. Stomach/Bowel: Large  hiatal hernia containing a significant portion of the proximal stomach, which is fluid-filled. Circumferential wall thickening of the partially visualized distal esophagus. No small bowel wall thickening or inflammation. No small bowel obstruction. Normal appendix. Total colonic diverticulosis. No changes of acute diverticulitis. Vascular/Lymphatic: No aortic aneurysm. Diffuse aortoiliac atherosclerosis. No intraabdominal or pelvic lymphadenopathy. Reproductive: Age-related atrophy of the uterus and ovaries. Right ovarian cyst measuring 3 cm.No free pelvic fluid. Other: No pneumoperitoneum, ascites, or mesenteric inflammation. Musculoskeletal: No acute fracture or destructive lesion.Moderate S-shaped curvature of the thoracolumbar spine. Multilevel degenerative disc disease of the spine. Osteopenia. IMPRESSION: 1. Severe intrahepatic biliary ductal dilation with either ring-enhancing lesion or obstructive calculus in the common hepatic duct at the level of the porta hepatis (coronal 36, axial 26). GI consultation for ERCP recommended. 2. Mostly decompressed gallbladder containing a couple of radiopaque stones in the gallbladder neck. No changes of acute cholecystitis. 3. Large hiatal hernia containing a significant portion of the fluid-filled, proximal stomach. Circumferential wall thickening of the distal esophagus, likely due to gastroesophageal reflux disease. 4. Right ovarian cyst measuring 3 cm. Electronically Signed   By: Rance Burrows M.D.   On: 01/20/2024 14:45    Today   Subjective    Amy Calderon today has no new complaints  - Appetite is not great   on 01/22/2024 --- discussed with patient's great nephew Lillard Reichmann  at bedside    Patient has been seen and examined prior to discharge   Objective   Blood pressure (!) 142/81, pulse 61, temperature 98.8 F (37.1 C), temperature source Oral, resp. rate 18, height 5' (1.524 m), weight 52.6 kg, SpO2 98%.   Intake/Output Summary (Last 24  hours) at 01/22/2024 1144 Last data filed at 01/22/2024 0830 Gross per 24 hour  Intake 1370.66 ml  Output --  Net 1370.66 ml    Exam Gen:- Awake Alert, no acute distress  HEENT:- [place].AT, No sclera icterus Ears---HOH Neck-Supple Neck,No JVD,.  Lungs-  CTAB , good air movement bilaterally CV- S1, S2 normal, regular Abd-  +ve B.Sounds, Abd Soft, No tenderness,    Extremity/Skin:- No  edema,   good pulses Psych-affect is appropriate, oriented x3 Neuro-no new focal deficits, no tremors    Data Review   CBC w Diff:  Lab Results  Component Value Date   WBC 6.9 01/20/2024   HGB 13.7 01/20/2024   HCT 41.4 01/20/2024   PLT 325 01/20/2024   LYMPHOPCT 37 01/20/2024   MONOPCT 10 01/20/2024   EOSPCT 3 01/20/2024   BASOPCT 1 01/20/2024    CMP:  Lab Results  Component Value Date   NA 137 01/20/2024   K 3.7 01/20/2024   CL 102 01/20/2024   CO2 23 01/20/2024   BUN 12 01/20/2024   CREATININE 0.64 01/20/2024   PROT 6.4 (L) 01/20/2024   ALBUMIN 3.1 (L) 01/20/2024   BILITOT 13.0 (H) 01/20/2024   ALKPHOS 350 (H) 01/20/2024   AST 94 (H) 01/20/2024   ALT 56 (H) 01/20/2024  .  Total Discharge  time is about 33 minutes  Colin Dawley M.D on 01/22/2024 at 11:44 AM  Go to [email or web address] -  for contact info  [facility] - Office  [phone]

## 2024-01-28 ENCOUNTER — Inpatient Hospital Stay: Payer: Medicaid Other | Admitting: Neurology

## 2024-01-30 DIAGNOSIS — R2689 Other abnormalities of gait and mobility: Secondary | ICD-10-CM | POA: Diagnosis not present

## 2024-03-07 DEATH — deceased
